# Patient Record
Sex: Female | Born: 1935 | Race: Black or African American | Hispanic: No | Smoking: Never smoker
Health system: Southern US, Community
[De-identification: ages and names within clinical notes are randomized; demographics above are authoritative.]

## PROBLEM LIST (undated history)

## (undated) DIAGNOSIS — F329 Major depressive disorder, single episode, unspecified: Secondary | ICD-10-CM

## (undated) DIAGNOSIS — E11319 Type 2 diabetes mellitus with unspecified diabetic retinopathy without macular edema: Secondary | ICD-10-CM

## (undated) DIAGNOSIS — F32A Depression, unspecified: Secondary | ICD-10-CM

## (undated) DIAGNOSIS — E7800 Pure hypercholesterolemia, unspecified: Secondary | ICD-10-CM

## (undated) DIAGNOSIS — H269 Unspecified cataract: Secondary | ICD-10-CM

## (undated) DIAGNOSIS — I10 Essential (primary) hypertension: Secondary | ICD-10-CM

## (undated) DIAGNOSIS — E119 Type 2 diabetes mellitus without complications: Secondary | ICD-10-CM

## (undated) DIAGNOSIS — M1990 Unspecified osteoarthritis, unspecified site: Secondary | ICD-10-CM

## (undated) HISTORY — DX: Depression, unspecified: F32.A

## (undated) HISTORY — DX: Unspecified osteoarthritis, unspecified site: M19.90

## (undated) HISTORY — DX: Major depressive disorder, single episode, unspecified: F32.9

## (undated) HISTORY — PX: EYE SURGERY: SHX253

## (undated) HISTORY — PX: TUBAL LIGATION: SHX77

## (undated) HISTORY — DX: Unspecified cataract: H26.9

---

## 2013-03-30 DIAGNOSIS — I10 Essential (primary) hypertension: Secondary | ICD-10-CM | POA: Insufficient documentation

## 2013-04-06 DIAGNOSIS — E113593 Type 2 diabetes mellitus with proliferative diabetic retinopathy without macular edema, bilateral: Secondary | ICD-10-CM | POA: Insufficient documentation

## 2013-04-06 DIAGNOSIS — E1139 Type 2 diabetes mellitus with other diabetic ophthalmic complication: Secondary | ICD-10-CM | POA: Insufficient documentation

## 2013-04-06 DIAGNOSIS — E1121 Type 2 diabetes mellitus with diabetic nephropathy: Secondary | ICD-10-CM | POA: Insufficient documentation

## 2013-07-20 DIAGNOSIS — E11311 Type 2 diabetes mellitus with unspecified diabetic retinopathy with macular edema: Secondary | ICD-10-CM | POA: Insufficient documentation

## 2013-09-16 DIAGNOSIS — N289 Disorder of kidney and ureter, unspecified: Secondary | ICD-10-CM | POA: Insufficient documentation

## 2014-03-30 ENCOUNTER — Encounter (HOSPITAL_COMMUNITY): Payer: Self-pay | Admitting: Emergency Medicine

## 2014-03-30 ENCOUNTER — Emergency Department (HOSPITAL_COMMUNITY): Payer: Medicare Other

## 2014-03-30 ENCOUNTER — Emergency Department (HOSPITAL_COMMUNITY)
Admission: EM | Admit: 2014-03-30 | Discharge: 2014-03-30 | Disposition: A | Discharge status: Home / Self Care | Payer: Medicare Other | Attending: Emergency Medicine | Admitting: Emergency Medicine

## 2014-03-30 DIAGNOSIS — Z79899 Other long term (current) drug therapy: Secondary | ICD-10-CM | POA: Insufficient documentation

## 2014-03-30 DIAGNOSIS — Z7982 Long term (current) use of aspirin: Secondary | ICD-10-CM | POA: Insufficient documentation

## 2014-03-30 DIAGNOSIS — R7989 Other specified abnormal findings of blood chemistry: Secondary | ICD-10-CM

## 2014-03-30 DIAGNOSIS — R748 Abnormal levels of other serum enzymes: Secondary | ICD-10-CM | POA: Insufficient documentation

## 2014-03-30 DIAGNOSIS — K047 Periapical abscess without sinus: Secondary | ICD-10-CM

## 2014-03-30 DIAGNOSIS — K044 Acute apical periodontitis of pulpal origin: Secondary | ICD-10-CM | POA: Insufficient documentation

## 2014-03-30 DIAGNOSIS — I10 Essential (primary) hypertension: Secondary | ICD-10-CM | POA: Insufficient documentation

## 2014-03-30 DIAGNOSIS — E7800 Pure hypercholesterolemia, unspecified: Secondary | ICD-10-CM | POA: Insufficient documentation

## 2014-03-30 DIAGNOSIS — L0201 Cutaneous abscess of face: Secondary | ICD-10-CM | POA: Insufficient documentation

## 2014-03-30 DIAGNOSIS — L03211 Cellulitis of face: Secondary | ICD-10-CM

## 2014-03-30 HISTORY — DX: Pure hypercholesterolemia, unspecified: E78.00

## 2014-03-30 HISTORY — DX: Essential (primary) hypertension: I10

## 2014-03-30 LAB — COMPREHENSIVE METABOLIC PANEL
ALK PHOS: 70 U/L (ref 39–117)
ALT: 12 U/L (ref 0–35)
AST: 17 U/L (ref 0–37)
Albumin: 3.3 g/dL — ABNORMAL LOW (ref 3.5–5.2)
BUN: 19 mg/dL (ref 6–23)
CO2: 23 meq/L (ref 19–32)
Calcium: 9.3 mg/dL (ref 8.4–10.5)
Chloride: 102 mEq/L (ref 96–112)
Creatinine, Ser: 1.35 mg/dL — ABNORMAL HIGH (ref 0.50–1.10)
GFR, EST AFRICAN AMERICAN: 42 mL/min — AB (ref >90)
GFR, EST NON AFRICAN AMERICAN: 37 mL/min — AB (ref >90)
GLUCOSE: 124 mg/dL — AB (ref 70–99)
POTASSIUM: 3.9 meq/L (ref 3.7–5.3)
Sodium: 139 mEq/L (ref 137–147)
Total Bilirubin: 0.8 mg/dL (ref 0.3–1.2)
Total Protein: 7.2 g/dL (ref 6.0–8.3)

## 2014-03-30 MED ORDER — OXYCODONE-ACETAMINOPHEN 5-325 MG PO TABS: 1.0000 | ORAL_TABLET | ORAL | Status: DC | PRN

## 2014-03-30 MED ORDER — ONDANSETRON 4 MG PO TBDP
4.0000 mg | ORAL_TABLET | Freq: Once | ORAL | Status: AC
  Administered 2014-03-30: 4 mg via ORAL
  Filled 2014-03-30: qty 1

## 2014-03-30 MED ORDER — AMOXICILLIN 500 MG PO CAPS: 500.0000 mg | ORAL_CAPSULE | Freq: Three times a day (TID) | ORAL | Status: DC

## 2014-03-30 MED ORDER — OXYCODONE-ACETAMINOPHEN 5-325 MG PO TABS
1.0000 | ORAL_TABLET | Freq: Once | ORAL | Status: AC
  Administered 2014-03-30: 1 via ORAL
  Filled 2014-03-30: qty 1

## 2014-03-30 MED ORDER — ONDANSETRON HCL 4 MG PO TABS: 4.0000 mg | ORAL_TABLET | Freq: Four times a day (QID) | ORAL | Status: DC

## 2014-03-30 MED ORDER — IOHEXOL 300 MG/ML  SOLN
80.0000 mL | Freq: Once | INTRAMUSCULAR | Status: AC | PRN
Start: 2014-03-30 — End: 2014-03-30
  Administered 2014-03-30: 80 mL via INTRAVENOUS

## 2014-03-30 NOTE — ED Provider Notes (Signed)
CSN: [id number]     Arrival date & time 03/30/14  1359 History   First MD Initiated Contact with Patient 03/30/14 1513     Chief Complaint  Patient presents with  . Dental Pain     (Consider location/radiation/quality/duration/timing/severity/associated sxs/prior Treatment) Patient is a 80 y.o. female presenting with tooth pain. The history is provided by the patient. No language interpreter was used.  Dental Pain Location:  Lower Lower teeth location:  30/RL 1st molar and 29/RL 2nd bicuspid Quality:  Constant Severity:  Severe Onset quality:  Gradual Duration:  24 hours Timing:  Constant Progression:  Worsening Context: abscess, crown fracture, dental caries and poor dentition   Context: cap still on, not dental fracture, not enamel fracture, filling still in place, not intrusion, not malocclusion, not recent dental surgery and not trauma   Relieved by:  Nothing Worsened by:  Pressure, jaw movement and touching Ineffective treatments:  NSAIDs and topical anesthetic gel Associated symptoms: facial pain, facial swelling and gum swelling   Associated symptoms: no difficulty swallowing, no fever, no headaches, no neck pain, no neck swelling, no oral bleeding, no oral lesions and no trismus   Risk factors: lack of dental care   Risk factors: no alcohol problem, no cancer, no diabetes and no smoking     Past Medical History  Diagnosis Date  . Hypertension   . High cholesterol    History reviewed. No pertinent past surgical history. History reviewed. No pertinent family history. History  Substance Use Topics  . Smoking status: Never Smoker   . Smokeless tobacco: Not on file  . Alcohol Use: No   OB History   Grav Para Term Preterm Abortions TAB SAB Ect Mult Living                 Review of Systems  Constitutional: Negative for fever.  HENT: Positive for facial swelling. Negative for mouth sores.   Musculoskeletal: Negative for neck pain.  Neurological: Negative for  headaches.      Allergies  Codeine  Home Medications   Prior to Admission medications   Medication Sig Start Date End Date Taking? Authorizing Provider  aspirin 81 MG tablet Take 81 mg by mouth daily.   Yes Historical Provider, MD  carvedilol (COREG) 12.5 MG tablet Take 12.5 mg by mouth 2 (two) times daily with a meal.   Yes Historical Provider, MD  lisinopril-hydrochlorothiazide (PRINZIDE,ZESTORETIC) 20-12.5 MG per tablet Take 1 tablet by mouth daily.   Yes Historical Provider, MD  Multiple Vitamin (MULTIVITAMIN WITH MINERALS) TABS tablet Take 1 tablet by mouth daily.   Yes Historical Provider, MD   BP 145/53  Pulse 62  Temp(Src) 97.9 F (36.6 C) (Oral)  Resp 16  SpO2 94% Physical Exam  Constitutional: She is oriented to person, place, and time. She appears well-developed and well-nourished. No distress.  HENT:  Head: Normocephalic and atraumatic.  Mouth/Throat:    Eyes: Conjunctivae are normal. No scleral icterus.  Neck: Normal range of motion.  Cardiovascular: Normal rate, regular rhythm and normal heart sounds.  Exam reveals no gallop and no friction rub.   No murmur heard. Pulmonary/Chest: Effort normal and breath sounds normal. No respiratory distress.  Abdominal: Soft. Bowel sounds are normal. She exhibits no distension and no mass. There is no tenderness. There is no guarding.  Neurological: She is alert and oriented to person, place, and time.  Skin: Skin is warm and dry. She is not diaphoretic.    ED Course  Procedures (including  critical care time) Labs Review Labs Reviewed - No data to display  Imaging Review No results found.   EKG Interpretation None      MDM   Final diagnoses:  None   3:44 PM BP 145/53  Pulse 62  Temp(Src) 97.9 F (36.6 C) (Oral)  Resp 16  SpO2 94% Patient seen in shared visit with Dr. Sabra Heck. Will CT face. Pain meds given'   6:23 PM CT is negative. She was only cellulitis. No gross abscess. The patient will be  discharged with pain medication and amoxicillin. She was to follow up with her dentist shortly. Patient wasn't elevated serum creatinine. She is instructed to follow up with her PCP within 72 hours for a check of his creatinine   Margarita Mail, PA-C 03/30/14 1825

## 2014-03-30 NOTE — ED Notes (Signed)
Pt reports cracked bottom right tooth. Pt started having right sided dental pain yesterday, swelling very noticeable this morning. Denies difficulty breathing. Pain 10/10. Reports she started on a cholesterol medicine 2 weeks ago.

## 2014-03-30 NOTE — Discharge Instructions (Signed)
You have been seen by your caregiver because of dental pain.  SEEK MEDICAL ATTENTION IF: The exam and treatment you received today has been provided on an emergency basis only. This is not a substitute for complete medical or dental care. If your problem worsens or new symptoms (problems) appear, and you are unable to arrange prompt follow-up care with your dentist, call or return to this location. CALL YOUR DENTIST OR RETURN IMMEDIATELY IF you develop a fever, rash, difficulty breathing or swallowing, neck or facial swelling, or other potentially serious concerns.   Your Creatinine (a marker of your kidney function) was elevated at 1.38 today. You will need to follow up with your doctor within  72 hours to have this rechecked.   You have been diagnosed with Dental pain. Please call the follow up dentist first thing in the morning on Monday for a follow up appointment. Keep your discharge paperwork from today's visit to bring to the dentist office. You may also use the resource guide listed below to help you find a dentist if you do not already have one to followup with. It is very important that you get evaluated by a dentist as soon as possible.  Use your pain medication as prescribed and do not operate heavy machinery while on pain medication. Note that your pain medication contains acetaminophen (Tylenol) & its is not reccommended that you use additional acetaminophen (Tylenol) while taking this medication. Take your full course of antibiotics. Read the instructions below.  Eat a soft or liquid diet and rinse your mouth out after meals with warm water. You should see a dentist or return here at once if you have increased swelling, increased pain or uncontrolled bleeding from the site of your injury.   SEEK MEDICAL CARE IF:   You have increased pain not controlled with medicines.   You have swelling around your tooth, in your face or neck.   You have bleeding which starts, continues, or gets  worse.   You have a fever >101  If you are unable to open your mouth Soft Diet  The soft diet may be recommended after you were put on a full liquid diet. A normal diet may follow. The soft diet can also be used after surgery if you are too ill to keep down a normal diet. The soft diet may also be needed if you have a hard time chewing foods.  DESCRIPTION  Tender foods are used. Foods do not need to be ground or pureed. Most raw fruits and vegetables and coarse breads and cereals should be avoided. Fried foods and highly seasoned foods may cause discomfort.  NUTRITIONAL ADEQUACY  A healthy diet is possible if foods from each of the basic food groups are eaten daily.  SOFT DIET FOOD LISTS  Milk/Dairy  Allowed: Milk and milk drinks, milk shakes, cream cheese, cottage cheese, mild cheeses.  Avoid: Sharp or highly seasoned cheese. Meat/Meat Substitutes  Allowed: Broiled, roasted, baked, or stewed tender lean beef, mutton, lamb, veal, chicken, [place], liver, ham, crisp bacon, white fish, tuna, salmon. Eggs, smooth peanut butter.  Avoid: All fried meats, fish, or fowl. Rich gravies and sauces. Lunch meats, sausages, hot dogs. Meats with gristle, chunky peanut butter. Breads/Grains  Allowed: Rice, noodles, spaghetti, macaroni. Dry or cooked refined cereals, such as farina, cream of wheat, oatmeal, grits, whole-wheat cereals. Plain or toasted white or wheat blend or whole-grain breads, soda crackers or saltines, flour tortillas.  Avoid: Wild rice, coarse cereals, such as bran.  Seed in or on breads and crackers. Bread or bread products with nuts or seeds. Fruits/Vegetables  Allowed: Fruit and vegetable juices, well-cooked or canned fruits and vegetables, any dried fruit. One citrus fruit daily, 1 vitamin A source daily. Well-ripened, easy to chew fruits, sweet potatoes. Baked, boiled, mashed, creamed, scalloped, or au gratin potatoes. Broths or creamed soups made with allowed vegetables, strained  tomatoes.  Avoid: All gas-forming vegetables (corn, radishes, Brussels sprouts, onions, broccoli, cabbage, parsnips, turnips, chili peppers, pinto beans, split peas, dried beans). Fruits containing seeds and skin. Potato chips and corn chips. All others that are not made with allowed vegetables. Highly seasoned soups. Desserts/Sweets  Allowed: Simple desserts, such as custard, junkets, gelatin desserts, plain ice cream and sherbets, simple cakes and cookies, allowed fruits, sugar, syrup, jelly, honey, plain hard candy, and molasses.  Avoid: Rich pastries, any dessert containing dates, nuts, raisins, or coconut. Fried pastries, such as doughnuts. Chocolate. Beverages  Allowed: Fruit and vegetable juices. Caffeine-free carbonated drinks, coffee, and tea.  Avoid: Caffeinated beverages: coffee, tea, soda or pop. Miscellaneous  Allowed: Butter, cream, margarine, mayonnaise, oil. Cream sauces, salt, and mild spices.  Avoid: Highly spiced salad dressings. Highly seasoned foods, hot sauce, mustard, horseradish, and pepper. SAMPLE MENU  Breakfast  Orange juice.  Oatmeal.  Soft cooked egg.  Toast and margarine.  2% milk.  Coffee. Lunch  Meatloaf.  Mashed potato.  Green beans.  Lemon pudding.  Bread and margarine.  Coffee. Dinner  Consomm or apricot nectar.  Chicken breast.  Rice, peas, and carrots.  Applesauce.  Bread and margarine.  2% milk. To cut the amount of fat in your diet, omit margarine and use 1% or skim milk.  NUTRIENT ANALYSIS  Calories........................1953 Kcal.  Protein.........................102 gm.  Carbohydrate...............247 gm.  Fat................................65 gm.  Cholesterol...................449 mg.  Dietary fiber.................19 gm.  Vitamin A.....................2944 RE.  Vitamin C.....................79 mg.  Niacin..........................25 mg.  Riboflavin....................2.0 mg.  Thiamin.......................1.5 mg.    Folate..........................249 mcg.  Calcium.......................1030 mg.  Phosphorus.................1782 mg.  Zinc..............................12 mg.  Iron..............................13 mg.  Sodium.........................299 mg.  Potassium....................3046 mg. Document Released: 02/19/2008 Document Revised: 02/04/2012 Document Reviewed: 02/19/2008  [facility] Patient Information 2014 [place], [place].   \ Abscess An abscess is an infected area that contains a collection of pus and debris.It can occur in almost any part of the body. An abscess is also known as a furuncle or boil. CAUSES  An abscess occurs when tissue gets infected. This can occur from blockage of oil or sweat glands, infection of hair follicles, or a minor injury to the skin. As the body tries to fight the infection, pus collects in the area and creates pressure under the skin. This pressure causes pain. People with weakened immune systems have difficulty fighting infections and get certain abscesses more often.  SYMPTOMS Usually an abscess develops on the skin and becomes a painful mass that is red, warm, and tender. If the abscess forms under the skin, you may feel a moveable soft area under the skin. Some abscesses break open (rupture) on their own, but most will continue to get worse without care. The infection can spread deeper into the body and eventually into the bloodstream, causing you to feel ill.  DIAGNOSIS  Your caregiver will take your medical history and perform a physical exam. A sample of fluid may also be taken from the abscess to determine what is causing your infection. TREATMENT  Your caregiver may prescribe antibiotic medicines to fight the infection. However, taking antibiotics alone usually does not cure an  abscess. Your caregiver may need to make a small cut (incision) in the abscess to drain the pus. In some cases, gauze is packed into the abscess to reduce pain and to continue draining  the area. HOME CARE INSTRUCTIONS   Only take over-the-counter or prescription medicines for pain, discomfort, or fever as directed by your caregiver.  If you were prescribed antibiotics, take them as directed. Finish them even if you start to feel better.  If gauze is used, follow your caregiver's directions for changing the gauze.  To avoid spreading the infection:  Keep your draining abscess covered with a bandage.  Wash your hands well.  Do not share personal care items, towels, or whirlpools with others.  Avoid skin contact with others.  Keep your skin and clothes clean around the abscess.  Keep all follow-up appointments as directed by your caregiver. SEEK MEDICAL CARE IF:   You have increased pain, swelling, redness, fluid drainage, or bleeding.  You have muscle aches, chills, or a general ill feeling.  You have a fever. MAKE SURE YOU:   Understand these instructions.  Will watch your condition.  Will get help right away if you are not doing well or get worse. Document Released: 08/22/2005 Document Revised: 05/13/2012 Document Reviewed: 01/25/2012 [facility] Patient Information 2014 [place].  RESOURCE GUIDE   Dental Problems  Dr. Alexis Goodell $200 dollar visit [address] [place], [place] [address]  [phone]    Patients with Medicaid: [place] [phone] [address] Phone:  [phone]                                                  Phone:  [phone]  If unable to pay or uninsured, contact:  [facility] or [facility]. to become qualified for the adult dental clinic.  Chronic Pain Problems Contact [facility]  [phone] Patients need to be referred by their primary care doctor.  Insufficient Money for Medicine Contact [employer]:  call "211" or [place] [phone].  No  Primary Care Doctor Call [facility]  [phone] Other agencies that provide inexpensive medical care    [place]  [phone]    [facility]  [place]  [phone]    [facility]  [phone]    [facility]  [place]  [phone] [place]   [phone] (emergency services [phone])  Substance Abuse [facility]  2147797121 [facility] [phone] [facility] [place] [phone] Chinita Pester [phone] Residential & Outpatient Substance Abuse Program  [phone]  Abuse/Neglect [place] [phone] [place] [phone] (After Hours)  Emergency [place] [phone]  [place] [facility] [phone] Bartolo Darter  [place] Services [phone]  MRSA Hotline #:   (757)214-7594    [facility] Dept. [address]      [place] Phone:  [phone]                                   Phone:  [phone]                 Phone:  [place] Phone:  [place] [phone] (After Hours)

## 2014-03-30 NOTE — ED Provider Notes (Signed)
80 y/o female with a history of very poor dentition.  Presents with pain to the R lower jaw - this has become acutely worse over the last 2 days - worse with chewing, no pain with swallowing, no fevers.  On exam has a large swollen R lower jaw with significant pain and tenderness.  There is a patent airway and no swelling under the tongue or pain with tongue protrusion.  CT necessary to eval extent of swelling.    No abscess seen, pt with no compromised airway, cellulitis to be treated with abx, pt informed of tx plan  Medical screening examination/treatment/procedure(s) were conducted as a shared visit with non-physician practitioner(s) and myself.  I personally evaluated the patient during the encounter.    Johnna Acosta, MD 03/31/14 2112

## 2014-03-30 NOTE — ED Notes (Signed)
[phone]  Linton Rump- daughter, she will be back later.

## 2014-03-31 NOTE — ED Provider Notes (Signed)
Medical screening examination/treatment/procedure(s) were conducted as a shared visit with non-physician practitioner(s) and myself.  I personally evaluated the patient during the encounter  Please see my separate respective documentation pertaining to this patient encounter   Johnna Acosta, MD 03/31/14 2113

## 2014-06-24 ENCOUNTER — Encounter (HOSPITAL_COMMUNITY): Payer: Self-pay | Admitting: Emergency Medicine

## 2014-06-24 ENCOUNTER — Emergency Department (HOSPITAL_COMMUNITY)
Admission: EM | Admit: 2014-06-24 | Discharge: 2014-06-25 | Disposition: A | Discharge status: Home / Self Care | Payer: Medicare HMO | Attending: Emergency Medicine | Admitting: Emergency Medicine

## 2014-06-24 DIAGNOSIS — Z7982 Long term (current) use of aspirin: Secondary | ICD-10-CM | POA: Insufficient documentation

## 2014-06-24 DIAGNOSIS — I10 Essential (primary) hypertension: Secondary | ICD-10-CM | POA: Insufficient documentation

## 2014-06-24 DIAGNOSIS — M25439 Effusion, unspecified wrist: Secondary | ICD-10-CM | POA: Diagnosis not present

## 2014-06-24 DIAGNOSIS — Z79899 Other long term (current) drug therapy: Secondary | ICD-10-CM | POA: Diagnosis not present

## 2014-06-24 DIAGNOSIS — M25531 Pain in right wrist: Secondary | ICD-10-CM

## 2014-06-24 DIAGNOSIS — M25539 Pain in unspecified wrist: Secondary | ICD-10-CM | POA: Diagnosis not present

## 2014-06-24 DIAGNOSIS — E7800 Pure hypercholesterolemia, unspecified: Secondary | ICD-10-CM | POA: Insufficient documentation

## 2014-06-24 NOTE — ED Notes (Signed)
Pt was dropped off at Urgent Care by her daughter and they sent her here, she complains of wrist pain and had a negative xray at Urgent Care, she had a low grade fever at Urgent Care. Apparently her daughter dropped her off here also. Pt came in talking about the rain and it's not raining outside. Pt might have a history of dementia

## 2014-06-25 ENCOUNTER — Emergency Department (HOSPITAL_COMMUNITY): Payer: Medicare HMO

## 2014-06-25 LAB — CBC WITH DIFFERENTIAL/PLATELET
BASOS PCT: 0 % (ref 0–1)
Basophils Absolute: 0 10*3/uL (ref 0.0–0.1)
EOS ABS: 0.2 10*3/uL (ref 0.0–0.7)
Eosinophils Relative: 2 % (ref 0–5)
HCT: 31.8 % — ABNORMAL LOW (ref 36.0–46.0)
Hemoglobin: 11.2 g/dL — ABNORMAL LOW (ref 12.0–15.0)
Lymphocytes Relative: 22 % (ref 12–46)
Lymphs Abs: 2.4 10*3/uL (ref 0.7–4.0)
MCH: 32.2 pg (ref 26.0–34.0)
MCHC: 35.2 g/dL (ref 30.0–36.0)
MCV: 91.4 fL (ref 78.0–100.0)
MONOS PCT: 9 % (ref 3–12)
Monocytes Absolute: 1 10*3/uL (ref 0.1–1.0)
Neutro Abs: 7.1 10*3/uL (ref 1.7–7.7)
Neutrophils Relative %: 67 % (ref 43–77)
PLATELETS: 347 10*3/uL (ref 150–400)
RBC: 3.48 MIL/uL — ABNORMAL LOW (ref 3.87–5.11)
RDW: 12.3 % (ref 11.5–15.5)
WBC: 10.7 10*3/uL — ABNORMAL HIGH (ref 4.0–10.5)

## 2014-06-25 LAB — URINE MICROSCOPIC-ADD ON

## 2014-06-25 LAB — URINALYSIS, ROUTINE W REFLEX MICROSCOPIC
BILIRUBIN URINE: NEGATIVE
Glucose, UA: NEGATIVE mg/dL
Hgb urine dipstick: NEGATIVE
KETONES UR: NEGATIVE mg/dL
Nitrite: NEGATIVE
Protein, ur: NEGATIVE mg/dL
Specific Gravity, Urine: 1.013 (ref 1.005–1.030)
UROBILINOGEN UA: 0.2 mg/dL (ref 0.0–1.0)
pH: 5 (ref 5.0–8.0)

## 2014-06-25 LAB — BASIC METABOLIC PANEL
ANION GAP: 16 — AB (ref 5–15)
BUN: 13 mg/dL (ref 6–23)
CALCIUM: 10.1 mg/dL (ref 8.4–10.5)
CO2: 24 meq/L (ref 19–32)
CREATININE: 1.37 mg/dL — AB (ref 0.50–1.10)
Chloride: 93 mEq/L — ABNORMAL LOW (ref 96–112)
GFR calc Af Amer: 42 mL/min — ABNORMAL LOW (ref >90)
GFR calc non Af Amer: 36 mL/min — ABNORMAL LOW (ref >90)
Glucose, Bld: 175 mg/dL — ABNORMAL HIGH (ref 70–99)
Potassium: 3.9 mEq/L (ref 3.7–5.3)
Sodium: 133 mEq/L — ABNORMAL LOW (ref 137–147)

## 2014-06-25 LAB — URIC ACID: Uric Acid, Serum: 8.9 mg/dL — ABNORMAL HIGH (ref 2.4–7.0)

## 2014-06-25 LAB — SEDIMENTATION RATE: Sed Rate: 126 mm/hr — ABNORMAL HIGH (ref 0–22)

## 2014-06-25 MED ORDER — IBUPROFEN 600 MG PO TABS: 600.0000 mg | ORAL_TABLET | Freq: Three times a day (TID) | ORAL | Status: DC | PRN

## 2014-06-25 NOTE — ED Provider Notes (Signed)
CSN: [id number]     Arrival date & time 06/24/14  2206 History   First MD Initiated Contact with Patient 06/25/14 0024     Chief Complaint  Patient presents with  . Wrist Pain     (Consider location/radiation/quality/duration/timing/severity/associated sxs/prior Treatment) HPI This is a 80 year old female with pain in her right forearm for the past 2 and half days. She denies injury. There is associated swelling it was worse earlier but has improved. She was seen at [facility] yesterday evening. Per their report the patient was unable to move her right wrist due to severe pain. X-rays were performed of the right wrist and the right elbow which were read as negative. She was noted to have a temperature of 100.3. She was sent here out of concern for a septic joint. The patient denies chills, nausea or vomiting. She has not been coughing or shortness of breath. She denies dysuria. She states she was given ibuprofen at the urgent care and has experienced significant improvement in her pain the  Past Medical History  Diagnosis Date  . Hypertension   . High cholesterol    History reviewed. No pertinent past surgical history. History reviewed. No pertinent family history. History  Substance Use Topics  . Smoking status: Never Smoker   . Smokeless tobacco: Not on file  . Alcohol Use: No   OB History   Grav Para Term Preterm Abortions TAB SAB Ect Mult Living                 Review of Systems  All other systems reviewed and are negative.   Allergies  Codeine  Home Medications   Prior to Admission medications   Medication Sig Start Date End Date Taking? Authorizing Provider  aspirin 81 MG tablet Take 81 mg by mouth every other day.    Yes Historical Provider, MD  carvedilol (COREG) 12.5 MG tablet Take 12.5 mg by mouth 2 (two) times daily with a meal.   Yes Historical Provider, MD  ibuprofen (ADVIL,MOTRIN) 200 MG tablet Take 400 mg by mouth every 6 (six) hours as needed for  moderate pain.   Yes Historical Provider, MD  lisinopril-hydrochlorothiazide (PRINZIDE,ZESTORETIC) 20-12.5 MG per tablet Take 1 tablet by mouth daily.   Yes Historical Provider, MD  Multiple Vitamin (MULTIVITAMIN WITH MINERALS) TABS tablet Take 1 tablet by mouth daily.   Yes Historical Provider, MD  oxyCODONE-acetaminophen (PERCOCET) 5-325 MG per tablet Take 1-2 tablets by mouth every 4 (four) hours as needed. 03/30/14  Yes Margarita Mail, PA-C  pravastatin (PRAVACHOL) 10 MG tablet Take 10 mg by mouth daily.   Yes Historical Provider, MD  tiZANidine (ZANAFLEX) 4 MG tablet Take 4 mg by mouth 2 (two) times daily as needed for muscle spasms.   Yes Historical Provider, MD   BP 149/62  Pulse 64  Temp(Src) 99.1 F (37.3 C) (Oral)  Resp 16  SpO2 100%  Physical Exam General: Well-developed, well-nourished female in no acute distress; appearance consistent with age of record HENT: normocephalic; atraumatic Eyes: pupils equal, round and reactive to light; extraocular muscles intact; lens implant Neck: supple Heart: regular rate and rhythm Lungs: clear to auscultation bilaterally Abdomen: soft; nondistended; nontender; no masses or hepatosplenomegaly; bowel sounds present Extremities: No deformity; swelling and warmth of right forearm without appreciable erythema, right snuff box tenderness but otherwise nontender right wrist but with pain in the first 3 fingers on flexion and extension of the right wrist, tendon function intact, right hand distally neurovascular intact  Neurologic: Awake, alert and oriented x 4; motor function intact in all extremities and symmetric; no facial droop Skin: Warm and dry Psychiatric: Normal mood and affect    ED Course  Procedures (including critical care time)  MDM   Nursing notes and vitals signs, including pulse oximetry, reviewed.  Summary of this visit's results, reviewed by myself:  Labs:  Results for orders placed during the hospital encounter of  06/24/14 (from the past 24 hour(s))  CBC WITH DIFFERENTIAL     Status: Abnormal   Collection Time    06/25/14  2:03 AM      Result Value Ref Range   WBC 10.7 (*) 4.0 - 10.5 K/uL   RBC 3.48 (*) 3.87 - 5.11 MIL/uL   Hemoglobin 11.2 (*) 12.0 - 15.0 g/dL   HCT 31.8 (*) 36.0 - 46.0 %   MCV 91.4  78.0 - 100.0 fL   MCH 32.2  26.0 - 34.0 pg   MCHC 35.2  30.0 - 36.0 g/dL   RDW 12.3  11.5 - 15.5 %   Platelets 347  150 - 400 K/uL   Neutrophils Relative % 67  43 - 77 %   Neutro Abs 7.1  1.7 - 7.7 K/uL   Lymphocytes Relative 22  12 - 46 %   Lymphs Abs 2.4  0.7 - 4.0 K/uL   Monocytes Relative 9  3 - 12 %   Monocytes Absolute 1.0  0.1 - 1.0 K/uL   Eosinophils Relative 2  0 - 5 %   Eosinophils Absolute 0.2  0.0 - 0.7 K/uL   Basophils Relative 0  0 - 1 %   Basophils Absolute 0.0  0.0 - 0.1 K/uL  SEDIMENTATION RATE     Status: Abnormal   Collection Time    06/25/14  2:03 AM      Result Value Ref Range   Sed Rate 126 (*) 0 - 22 mm/hr  URIC ACID     Status: Abnormal   Collection Time    06/25/14  2:03 AM      Result Value Ref Range   Uric Acid, Serum 8.9 (*) 2.4 - 7.0 mg/dL  BASIC METABOLIC PANEL     Status: Abnormal   Collection Time    06/25/14  2:03 AM      Result Value Ref Range   Sodium 133 (*) 137 - 147 mEq/L   Potassium 3.9  3.7 - 5.3 mEq/L   Chloride 93 (*) 96 - 112 mEq/L   CO2 24  19 - 32 mEq/L   Glucose, Bld 175 (*) 70 - 99 mg/dL   BUN 13  6 - 23 mg/dL   Creatinine, Ser 1.37 (*) 0.50 - 1.10 mg/dL   Calcium 10.1  8.4 - 10.5 mg/dL   GFR calc non Af Amer 36 (*) >90 mL/min   GFR calc Af Amer 42 (*) >90 mL/min   Anion gap 16 (*) 5 - 15  URINALYSIS, ROUTINE W REFLEX MICROSCOPIC     Status: Abnormal   Collection Time    06/25/14  3:06 AM      Result Value Ref Range   Color, Urine YELLOW  YELLOW   APPearance CLOUDY (*) CLEAR   Specific Gravity, Urine 1.013  1.005 - 1.030   pH 5.0  5.0 - 8.0   Glucose, UA NEGATIVE  NEGATIVE mg/dL   Hgb urine dipstick NEGATIVE  NEGATIVE    Bilirubin Urine NEGATIVE  NEGATIVE   Ketones, ur NEGATIVE  NEGATIVE mg/dL  Protein, ur NEGATIVE  NEGATIVE mg/dL   Urobilinogen, UA 0.2  0.0 - 1.0 mg/dL   Nitrite NEGATIVE  NEGATIVE   Leukocytes, UA SMALL (*) NEGATIVE  URINE MICROSCOPIC-ADD ON     Status: Abnormal   Collection Time    06/25/14  3:06 AM      Result Value Ref Range   Squamous Epithelial / LPF FEW (*) RARE   WBC, UA 0-2  <3 WBC/hpf   Urine-Other MUCOUS PRESENT      Imaging Studies: Dg Wrist Navic Only Right  06/25/2014   CLINICAL DATA:  Pain in the navicular area with limited movement of the wrist.  EXAM: DG WRIST NAVIC ONLY RIGHT  COMPARISON:  None.  FINDINGS: Limited evaluation of the wrist with single view obtained. There appear to be degenerative changes with calcification in the triangular fibrocartilage to and radial carpal joint consistent with chondrocalcinosis. As visualized, the carpal bones appear intact.  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 Scaphoid bone appears intact on single view.   Electronically Signed   By: Lucienne Capers M.D.   On: 06/25/2014 02:21   4:41 AM Patient is now able to move right wrist through full range of motion without significant pain. Her wrist is now nontender. This is not consistent with a septic joint. It is more likely this represents gout as her uric acid is elevated. The elevated ESR is nonspecific. We will have her followup with hand surgery or return to the ED should symptoms worsen.     Wynetta Fines, MD 06/25/14 [phone]

## 2014-06-25 NOTE — Discharge Instructions (Signed)

## 2014-06-25 NOTE — ED Notes (Signed)
Patient ambulated to restroom to collect urine sample.

## 2014-06-25 NOTE — ED Notes (Signed)
MD at bedside. 

## 2014-06-25 NOTE — ED Notes (Signed)
Pt said that she can't urinate when she "gets excited" and stated she will try in about five minutes.

## 2015-08-02 DIAGNOSIS — H26492 Other secondary cataract, left eye: Secondary | ICD-10-CM | POA: Insufficient documentation

## 2015-08-02 DIAGNOSIS — Z961 Presence of intraocular lens: Secondary | ICD-10-CM | POA: Insufficient documentation

## 2016-02-07 DIAGNOSIS — H4311 Vitreous hemorrhage, right eye: Secondary | ICD-10-CM | POA: Insufficient documentation

## 2016-02-14 ENCOUNTER — Ambulatory Visit (INDEPENDENT_AMBULATORY_CARE_PROVIDER_SITE_OTHER): Payer: Medicare HMO | Admitting: Sports Medicine

## 2016-02-14 ENCOUNTER — Encounter: Payer: Self-pay | Admitting: Sports Medicine

## 2016-02-14 VITALS — BP 125/62 | HR 54 | Resp 12

## 2016-02-14 DIAGNOSIS — M79671 Pain in right foot: Secondary | ICD-10-CM | POA: Diagnosis not present

## 2016-02-14 DIAGNOSIS — B351 Tinea unguium: Secondary | ICD-10-CM | POA: Diagnosis not present

## 2016-02-14 DIAGNOSIS — M79672 Pain in left foot: Secondary | ICD-10-CM

## 2016-02-14 DIAGNOSIS — M21619 Bunion of unspecified foot: Secondary | ICD-10-CM | POA: Diagnosis not present

## 2016-02-14 DIAGNOSIS — E119 Type 2 diabetes mellitus without complications: Secondary | ICD-10-CM

## 2016-02-14 DIAGNOSIS — M2040 Other hammer toe(s) (acquired), unspecified foot: Secondary | ICD-10-CM

## 2016-02-14 NOTE — Progress Notes (Deleted)
   Subjective:    Patient ID: Leah Stafford, female    DOB: [birth year], 82 y.o.   MRN: [id number]  HPI pt requesting for toenails are long and have discoloration/thick debridement and feet check.   Review of Systems  Skin: Positive for color change.  All other systems reviewed and are negative.      Objective:   Physical Exam        Assessment & Plan:

## 2016-02-14 NOTE — Patient Instructions (Signed)
Diabetes and Foot Care Diabetes may cause you to have problems because of poor blood supply (circulation) to your feet and legs. This may cause the skin on your feet to become thinner, break easier, and heal more slowly. Your skin may become dry, and the skin may peel and crack. You may also have nerve damage in your legs and feet causing decreased feeling in them. You may not notice minor injuries to your feet that could lead to infections or more serious problems. Taking care of your feet is one of the most important things you can do for yourself.  HOME CARE INSTRUCTIONS  Wear shoes at all times, even in the house. Do not go barefoot. Bare feet are easily injured.  Check your feet daily for blisters, cuts, and redness. If you cannot see the bottom of your feet, use a mirror or ask someone for help.  Wash your feet with warm water (do not use hot water) and mild soap. Then pat your feet and the areas between your toes until they are completely dry. Do not soak your feet as this can dry your skin.  Apply a moisturizing lotion or petroleum jelly (that does not contain alcohol and is unscented) to the skin on your feet and to dry, brittle toenails. Do not apply lotion between your toes.  Trim your toenails straight across. Do not dig under them or around the cuticle. File the edges of your nails with an emery board or nail file.  Do not cut corns or calluses or try to remove them with medicine.  Wear clean socks or stockings every day. Make sure they are not too tight. Do not wear knee-high stockings since they may decrease blood flow to your legs.  Wear shoes that fit properly and have enough cushioning. To break in new shoes, wear them for just a few hours a day. This prevents you from injuring your feet. Always look in your shoes before you put them on to be sure there are no objects inside.  Do not cross your legs. This may decrease the blood flow to your feet.  If you find a minor scrape,  cut, or break in the skin on your feet, keep it and the skin around it clean and dry. These areas may be cleansed with mild soap and water. Do not cleanse the area with peroxide, alcohol, or iodine.  When you remove an adhesive bandage, be sure not to damage the skin around it.  If you have a wound, look at it several times a day to make sure it is healing.  Do not use heating pads or hot water bottles. They may burn your skin. If you have lost feeling in your feet or legs, you may not know it is happening until it is too late.  Make sure your health care provider performs a complete foot exam at least annually or more often if you have foot problems. Report any cuts, sores, or bruises to your health care provider immediately. SEEK MEDICAL CARE IF:   You have an injury that is not healing.  You have cuts or breaks in the skin.  You have an ingrown nail.  You notice redness on your legs or feet.  You feel burning or tingling in your legs or feet.  You have pain or cramps in your legs and feet.  Your legs or feet are numb.  Your feet always feel cold. SEEK IMMEDIATE MEDICAL CARE IF:   There is increasing redness,   swelling, or pain in or around a wound.  There is a red line that goes up your leg.  Pus is coming from a wound.  You develop a fever or as directed by your health care provider.  You notice a bad smell coming from an ulcer or wound.   This information is not intended to replace advice given to you by your health care provider. Make sure you discuss any questions you have with your health care provider.   Document Released: 11/09/2000 Document Revised: 07/15/2013 Document Reviewed: 04/21/2013 Elsevier Interactive Patient Education 2016 Elsevier Inc[employer]  

## 2016-02-14 NOTE — Progress Notes (Signed)
Patient ID: Leah Stafford, female   DOB: [birth year], 82 y.o.   MRN: [id number] Subjective: Leah Stafford is a 82 y.o. female patient with history of diabetes who presents to office today complaining of long, painful nails  while ambulating in shoes; unable to trim. Patient states that the glucose reading this morning was not recorded; not on meds. Patient denies any new changes in medication or new problems. Patient denies any new cramping, numbness, burning or tingling in the legs.  Review of Systems  Skin: Positive for color change.  All other systems reviewed and are negative.   There are no active problems to display for this patient.  Current Outpatient Prescriptions on File Prior to Visit  Medication Sig Dispense Refill  . aspirin 81 MG tablet Take 81 mg by mouth every other day.     . carvedilol (COREG) 12.5 MG tablet Take 12.5 mg by mouth 2 (two) times daily with a meal.    . ibuprofen (ADVIL,MOTRIN) 200 MG tablet Take 400 mg by mouth every 6 (six) hours as needed for moderate pain.    Marland Kitchen ibuprofen (ADVIL,MOTRIN) 600 MG tablet Take 1 tablet (600 mg total) by mouth every 8 (eight) hours as needed (for wrist pain). 12 tablet 0  . lisinopril-hydrochlorothiazide (PRINZIDE,ZESTORETIC) 20-12.5 MG per tablet Take 1 tablet by mouth daily.    . Multiple Vitamin (MULTIVITAMIN WITH MINERALS) TABS tablet Take 1 tablet by mouth daily.    Marland Kitchen oxyCODONE-acetaminophen (PERCOCET) 5-325 MG per tablet Take 1-2 tablets by mouth every 4 (four) hours as needed. 20 tablet 0  . pravastatin (PRAVACHOL) 10 MG tablet Take 10 mg by mouth daily.    Marland Kitchen tiZANidine (ZANAFLEX) 4 MG tablet Take 4 mg by mouth 2 (two) times daily as needed for muscle spasms.     No current facility-administered medications on file prior to visit.   Allergies  Allergen Reactions  . Codeine Nausea And Vomiting    No results found for this or any previous visit (from the past 2160 hour(s)).  Objective: General: Patient is awake,  alert, and oriented x 3 and in no acute distress.  Integument: Skin is warm, dry and supple bilateral. Nails are tender, long, thickened and  dystrophic with subungual debris, consistent with onychomycosis, 1-5 bilateral. No signs of infection. No open lesions or preulcerative lesions present bilateral. Remaining integument unremarkable.  Vasculature:  Dorsalis Pedis pulse 2/4 bilateral. Posterior Tibial pulse  1/4 bilateral.  Capillary fill time <3 sec 1-5 bilateral. Scant hair growth to the level of the digits. Temperature gradient within normal limits. No varicosities present bilateral. No edema present bilateral.   Neurology: The patient has intact sensation measured with a 5.07/10g Semmes Weinstein Monofilament at all pedal sites bilateral . Vibratory sensation intact bilateral with tuning fork. No Babinski sign present bilateral.   Musculoskeletal: Asymptomatic bunion and hammertoe pedal deformities noted bilateral. Muscular strength 5/5 in all lower extremity muscular groups bilateral without pain on range of motion . No tenderness with calf compression bilateral.  Assessment and Plan: Problem List Items Addressed This Visit    None    Visit Diagnoses    Dermatophytosis of nail    -  Primary    Foot pain, bilateral        Diabetes mellitus without complication ([place])        Bunion        Hammer toe, unspecified laterality           -Examined patient. -Discussed and educated patient on  diabetic foot care, especially with  regards to the vascular, neurological and musculoskeletal systems.  -Stressed the importance of good glycemic control and the detriment of not  controlling glucose levels in relation to the foot. -Mechanically debrided all nails 1-5 bilateral using sterile nail nipper and filed with dremel without incident  -Recommend good supportive shoes for foot type daily -Answered all patient questions -Patient to return  in 3 months for at risk foot care -Patient  advised to call the office if any problems or questions arise in the meantime.  Leah Stafford, DPM

## 2016-05-15 ENCOUNTER — Ambulatory Visit: Payer: Medicare HMO | Admitting: Sports Medicine

## 2016-09-27 ENCOUNTER — Encounter (INDEPENDENT_AMBULATORY_CARE_PROVIDER_SITE_OTHER): Payer: Medicare HMO | Admitting: Ophthalmology

## 2016-10-11 ENCOUNTER — Encounter (INDEPENDENT_AMBULATORY_CARE_PROVIDER_SITE_OTHER): Payer: Medicare HMO | Admitting: Ophthalmology

## 2016-10-11 DIAGNOSIS — H43813 Vitreous degeneration, bilateral: Secondary | ICD-10-CM | POA: Diagnosis not present

## 2016-10-11 DIAGNOSIS — H35033 Hypertensive retinopathy, bilateral: Secondary | ICD-10-CM | POA: Diagnosis not present

## 2016-10-11 DIAGNOSIS — E11311 Type 2 diabetes mellitus with unspecified diabetic retinopathy with macular edema: Secondary | ICD-10-CM | POA: Diagnosis not present

## 2016-10-11 DIAGNOSIS — E113511 Type 2 diabetes mellitus with proliferative diabetic retinopathy with macular edema, right eye: Secondary | ICD-10-CM | POA: Diagnosis not present

## 2016-10-11 DIAGNOSIS — H4311 Vitreous hemorrhage, right eye: Secondary | ICD-10-CM | POA: Diagnosis not present

## 2016-10-11 DIAGNOSIS — E113592 Type 2 diabetes mellitus with proliferative diabetic retinopathy without macular edema, left eye: Secondary | ICD-10-CM | POA: Diagnosis not present

## 2016-10-11 DIAGNOSIS — I10 Essential (primary) hypertension: Secondary | ICD-10-CM | POA: Diagnosis not present

## 2016-11-12 ENCOUNTER — Other Ambulatory Visit (INDEPENDENT_AMBULATORY_CARE_PROVIDER_SITE_OTHER): Payer: Medicare HMO | Admitting: Ophthalmology

## 2016-12-06 ENCOUNTER — Other Ambulatory Visit (INDEPENDENT_AMBULATORY_CARE_PROVIDER_SITE_OTHER): Payer: Medicare HMO | Admitting: Ophthalmology

## 2016-12-06 DIAGNOSIS — E113512 Type 2 diabetes mellitus with proliferative diabetic retinopathy with macular edema, left eye: Secondary | ICD-10-CM | POA: Diagnosis not present

## 2016-12-06 DIAGNOSIS — E11311 Type 2 diabetes mellitus with unspecified diabetic retinopathy with macular edema: Secondary | ICD-10-CM

## 2017-01-04 ENCOUNTER — Inpatient Hospital Stay (HOSPITAL_COMMUNITY)
Admission: AD | Admit: 2017-01-04 | Discharge: 2017-01-04 | Disposition: A | Discharge status: Home / Self Care | Payer: Medicare HMO | Source: Ambulatory Visit | Attending: Obstetrics & Gynecology | Admitting: Obstetrics & Gynecology

## 2017-01-04 ENCOUNTER — Encounter (HOSPITAL_COMMUNITY): Payer: Self-pay | Admitting: *Deleted

## 2017-01-04 DIAGNOSIS — R339 Retention of urine, unspecified: Secondary | ICD-10-CM

## 2017-01-04 DIAGNOSIS — Z885 Allergy status to narcotic agent status: Secondary | ICD-10-CM | POA: Diagnosis not present

## 2017-01-04 DIAGNOSIS — Z7982 Long term (current) use of aspirin: Secondary | ICD-10-CM | POA: Diagnosis not present

## 2017-01-04 DIAGNOSIS — E119 Type 2 diabetes mellitus without complications: Secondary | ICD-10-CM

## 2017-01-04 DIAGNOSIS — I10 Essential (primary) hypertension: Secondary | ICD-10-CM | POA: Insufficient documentation

## 2017-01-04 DIAGNOSIS — E11319 Type 2 diabetes mellitus with unspecified diabetic retinopathy without macular edema: Secondary | ICD-10-CM | POA: Diagnosis not present

## 2017-01-04 DIAGNOSIS — Z79899 Other long term (current) drug therapy: Secondary | ICD-10-CM | POA: Diagnosis not present

## 2017-01-04 DIAGNOSIS — N813 Complete uterovaginal prolapse: Secondary | ICD-10-CM

## 2017-01-04 DIAGNOSIS — E785 Hyperlipidemia, unspecified: Secondary | ICD-10-CM | POA: Insufficient documentation

## 2017-01-04 DIAGNOSIS — E7800 Pure hypercholesterolemia, unspecified: Secondary | ICD-10-CM | POA: Diagnosis not present

## 2017-01-04 DIAGNOSIS — N814 Uterovaginal prolapse, unspecified: Secondary | ICD-10-CM | POA: Diagnosis present

## 2017-01-04 HISTORY — DX: Type 2 diabetes mellitus with unspecified diabetic retinopathy without macular edema: E11.319

## 2017-01-04 HISTORY — DX: Type 2 diabetes mellitus without complications: E11.9

## 2017-01-04 LAB — BASIC METABOLIC PANEL
Anion gap: 7 (ref 5–15)
BUN: 16 mg/dL (ref 6–20)
CO2: 27 mmol/L (ref 22–32)
CREATININE: 1.21 mg/dL — AB (ref 0.44–1.00)
Calcium: 9.5 mg/dL (ref 8.9–10.3)
Chloride: 104 mmol/L (ref 101–111)
GFR calc non Af Amer: 41 mL/min — ABNORMAL LOW (ref >60)
GFR, EST AFRICAN AMERICAN: 48 mL/min — AB (ref >60)
Glucose, Bld: 100 mg/dL — ABNORMAL HIGH (ref 65–99)
Potassium: 4.1 mmol/L (ref 3.5–5.1)
Sodium: 138 mmol/L (ref 135–145)

## 2017-01-04 LAB — URINALYSIS, ROUTINE W REFLEX MICROSCOPIC
BILIRUBIN URINE: NEGATIVE
Bilirubin Urine: NEGATIVE
Glucose, UA: NEGATIVE mg/dL
Glucose, UA: NEGATIVE mg/dL
Hgb urine dipstick: NEGATIVE
Hgb urine dipstick: NEGATIVE
Ketones, ur: NEGATIVE mg/dL
Ketones, ur: NEGATIVE mg/dL
LEUKOCYTES UA: NEGATIVE
Leukocytes, UA: NEGATIVE
NITRITE: NEGATIVE
NITRITE: NEGATIVE
PH: 7 (ref 5.0–8.0)
Protein, ur: NEGATIVE mg/dL
Protein, ur: NEGATIVE mg/dL
SPECIFIC GRAVITY, URINE: 1.006 (ref 1.005–1.030)
SPECIFIC GRAVITY, URINE: 1.005 (ref 1.005–1.030)
pH: 7 (ref 5.0–8.0)

## 2017-01-04 NOTE — MAU Provider Note (Signed)
Chief Complaint: Uterine prolapse  SUBJECTIVE HPI: Leah Stafford is a 83 y.o. [id number] at Unknown who presents to [facility] reporting uterine prolapse.   Patient states she was sent in by her PCP due to uterine prolapse. She says she has been having difficulty trying to find a gynecologist outpatient. She states she's been having uterine prolapse issues for the past 3 years however the past 6 months has noticed that it's been getting worse. She denies any pain, but says that it is a slight bit uncomfortable. Patient states she is able to ambulate despite the prolapsed organ. She states she does not get frequent UTIs but does state that she has urinary frequency and occasionally urinary incontinence. Patient has not been hospitalized in the past year. Per daughter at bedside, she noticed about 6 months ago she had a little bit of vaginal spotting in her underwear, and she noted she was having urinary incontinence, and when she evaluated she noticed how significant uterine prolapse had become.  Patient has had a history of 4 vaginal births, one of them was IUFD. She had a tubal ligation performed that is demonstrated by a 3 cm vertical incision at the low bikini line.  She is a diet controlled diabetic. She has hypertension and hyperlipidemia on medications for these.  Past Medical History:  Diagnosis Date  . Diabetes mellitus without complication ([facility])    Diet and excercise  . Diabetic retinopathy ([place])   . High cholesterol   . Hypertension    OB History  Gravida Para Term Preterm AB Living  4 4 3 1   3   SAB TAB Ectopic Multiple Live Births          3    # Outcome Date GA Lbr Len/2nd Weight Sex Delivery Anes PTL Lv  4 Preterm         FD  3 Term           2 Term           1 Term              No past surgical history on file. Social History   Social History  . Marital status: Widowed    Spouse name: N/A  . Number of children: N/A  . Years of education: N/A    Occupational History  . Not on file.   Social History Main Topics  . Smoking status: Never Smoker  . Smokeless tobacco: Not on file  . Alcohol use No  . Drug use: No  . Sexual activity: Not on file   Other Topics Concern  . Not on file   Social History Narrative  . No narrative on file   No current facility-administered medications on file prior to encounter.    Current Outpatient Prescriptions on File Prior to Encounter  Medication Sig Dispense Refill  . aspirin 81 MG tablet Take 81 mg by mouth daily.     . carvedilol (COREG) 12.5 MG tablet Take 12.5 mg by mouth 2 (two) times daily with a meal.    . lisinopril-hydrochlorothiazide (PRINZIDE,ZESTORETIC) 20-12.5 MG per tablet Take 1 tablet by mouth daily.    . Multiple Vitamin (MULTIVITAMIN WITH MINERALS) TABS tablet Take 1 tablet by mouth daily.    . pravastatin (PRAVACHOL) 10 MG tablet Take 10 mg by mouth daily.     Allergies  Allergen Reactions  . Codeine Nausea And Vomiting    I have reviewed the past Medical Hx, Surgical Hx, Social Hx, Allergies  and Medications.   REVIEW OF SYSTEMS  A comprehensive ROS was negative except per HPI.    OBJECTIVE Patient Vitals for the past 24 hrs:  BP Temp Temp src Pulse Resp  01/04/17 1331 159/58 98.5 F (36.9 C) Oral (!) 54 18    PHYSICAL EXAM Constitutional: Well-developed, well-nourished female in no acute distress.  Cardiovascular: normal rate, rhythm, no murmurs Respiratory: normal rate and effort. CTAB GI: Abd soft, non-tender, non-distended. Pos BS x 4. Vertical incisional scar on her low bikini line. MS: Extremities nontender, no edema, normal ROM Neurologic: Alert and oriented x 4. Ambulates well without assistance. GU: Neg CVAT. Complete pelvic organ prolapse with pink, atrophic, dry appearing cervix and vaginal mucosa.  Unable to fully reduce due to pain when prolapse was about half way returned into vagina. No bleeding noted. After verbal consent was obtained,  bladder decompression with a urinary catheter and pessary placement was performed. 800cc of clear yellow urine was drained via straight catheterization in the typical sterile procedure (due to post void residual showing 750cc x2 attempts). After urine was drained, a reduction of her prolapsed pelvic organs was performed by Dr. Harolyn Rutherford, and a size 5 Ring pessary with support was placed. Patient ambulated and was able to maintain her pessary in place. There was some minimal vaginal bleeding after reduction and pessary placement. Patient tolerated procedure well.   LAB RESULTS Results for orders placed or performed during the hospital encounter of 01/04/17 (from the past 24 hour(s))  Urinalysis, Routine w reflex microscopic     Status: None   Collection Time: 01/04/17  3:38 PM  Result Value Ref Range   Color, Urine YELLOW YELLOW   APPearance CLEAR CLEAR   Specific Gravity, Urine 1.006 1.005 - 1.030   pH 7.0 5.0 - 8.0   Glucose, UA NEGATIVE NEGATIVE mg/dL   Hgb urine dipstick NEGATIVE NEGATIVE   Bilirubin Urine NEGATIVE NEGATIVE   Ketones, ur NEGATIVE NEGATIVE mg/dL   Protein, ur NEGATIVE NEGATIVE mg/dL   Nitrite NEGATIVE NEGATIVE   Leukocytes, UA NEGATIVE NEGATIVE  Urinalysis, Routine w reflex microscopic     Status: Abnormal   Collection Time: 01/04/17  4:40 PM  Result Value Ref Range   Color, Urine STRAW (A) YELLOW   APPearance CLEAR CLEAR   Specific Gravity, Urine 1.005 1.005 - 1.030   pH 7.0 5.0 - 8.0   Glucose, UA NEGATIVE NEGATIVE mg/dL   Hgb urine dipstick NEGATIVE NEGATIVE   Bilirubin Urine NEGATIVE NEGATIVE   Ketones, ur NEGATIVE NEGATIVE mg/dL   Protein, ur NEGATIVE NEGATIVE mg/dL   Nitrite NEGATIVE NEGATIVE   Leukocytes, UA NEGATIVE NEGATIVE  Basic metabolic panel     Status: Abnormal   Collection Time: 01/04/17  5:01 PM  Result Value Ref Range   Sodium 138 135 - 145 mmol/L   Potassium 4.1 3.5 - 5.1 mmol/L   Chloride 104 101 - 111 mmol/L   CO2 27 22 - 32 mmol/L    Glucose, Bld 100 (H) 65 - 99 mg/dL   BUN 16 6 - 20 mg/dL   Creatinine, Ser 1.21 (H) 0.44 - 1.00 mg/dL   Calcium 9.5 8.9 - 10.3 mg/dL   GFR calc non Af Amer 41 (L) >60 mL/min   GFR calc Af Amer 48 (L) >60 mL/min   Anion gap 7 5 - 15    IMAGING No results found.  MAU COURSE Vaginal exam UA - straight cath Bladder scan - 750cc;  Straight cath performed emptying 800cc clear yellow  urine. Pessary placement - size 5 Ring pessary with support BMP- Creatinine was 1.21, but is improved compared to 2 years previously  5:51 PM - Spoke with Dr. Glo Herring regarding patient's creatinine level, agreed no need to admit or place a foley catheter, as this is likely mechanical cause and is improved compared to previous creatinine level, likely a chronic kidney disease patient.    MDM Plan of care reviewed with patient, including labs and tests ordered and medical treatment. Warning signs/symptoms to return to ED: ie. Unable to urinate, fevers/chills, worsening pain or bleeding. Discussed with patient if pessary falls out at home, to await visit with Dr. Zigmund Daniel for follow up, no need to return to MAU to have replaced.    ASSESSMENT 1. Third degree uterine prolapse   2. Procidentia of uterus   3. Complete uterovaginal prolapse   4. Urinary retention     PLAN Discharge home in stable condition. Workup to be performed outpatient at [facility] with Dr. Maryland Pink, uro-gyn. Referral made today.  To monitor urine output and to return if unable to urinate or if significant pain occurs. Pessary in place.   Follow-up Information    Marti Sleigh, MD. Call in 1 week(s).   Specialties:  Gynecology, Urology Why:  Follow up for evaluation and management Contact information: [place] [address] [phone]          Allergies as of 01/04/2017      Reactions   Codeine Nausea And Vomiting      Medication List    STOP taking these medications   ibuprofen  200 MG tablet Commonly known as:  ADVIL,MOTRIN     TAKE these medications   aspirin 81 MG tablet Take 81 mg by mouth daily.   carvedilol 12.5 MG tablet Commonly known as:  COREG Take 12.5 mg by mouth 2 (two) times daily with a meal.   DUREZOL 0.05 % Emul Generic drug:  Difluprednate Inject 1 drop into the eye daily.   lisinopril-hydrochlorothiazide 20-12.5 MG tablet Commonly known as:  PRINZIDE,ZESTORETIC Take 1 tablet by mouth daily.   multivitamin with minerals Tabs tablet Take 1 tablet by mouth daily.   pravastatin 10 MG tablet Commonly known as:  PRAVACHOL Take 10 mg by mouth daily.   vitamin C 500 MG tablet Commonly known as:  ASCORBIC ACID Take 500 mg by mouth daily.   VITAMIN D PO Take 5,000 Units by mouth daily. OTC, strength not known        Katherine Basset, DO OB Fellow 01/04/2017 5:51 PM

## 2017-01-04 NOTE — MAU Note (Signed)
Pt denies pain, but is uncomfortable.

## 2017-01-04 NOTE — Discharge Instructions (Signed)
Pelvic Organ Prolapse Introduction Pelvic organ prolapse is the stretching, bulging, or dropping of pelvic organs into an abnormal position. It happens when the muscles and tissues that surround and support pelvic structures are stretched or weak. Pelvic organ prolapse can involve:  Vagina (vaginal prolapse).  Uterus (uterine prolapse).  Bladder (cystocele).  Rectum (rectocele).  Intestines (enterocele). When organs other than the vagina are involved, they often bulge into the vagina or protrude from the vagina, depending on how severe the prolapse is. What are the causes? Causes of this condition include:  Pregnancy, labor, and childbirth.  Long-lasting (chronic) cough.  Chronic constipation.  Obesity.  Past pelvic surgery.  Aging. During and after menopause, a decreased production of the hormone estrogen can weaken pelvic ligaments and muscles.  Consistently lifting more than 50 lb (23 kg).  Buildup of fluid in the abdomen due to certain diseases and other conditions. What are the signs or symptoms? Symptoms of this condition include:  Loss of bladder control when you cough, sneeze, strain, and exercise (stress incontinence). This may be worse immediately following childbirth, and it may gradually improve over time.  Feeling pressure in your pelvis or vagina. This pressure may increase when you cough or when you are having a bowel movement.  A bulge that protrudes from the opening of your vagina or against your vaginal wall. If your uterus protrudes through the opening of your vagina and rubs against your clothing, you may also experience soreness, ulcers, infection, pain, and bleeding.  Increased effort to have a bowel movement or urinate.  Pain in your low back.  Pain, discomfort, or disinterest in sexual intercourse.  Repeated bladder infections (urinary tract infections).  Difficulty inserting or inability to insert a tampon or applicator. In some people, this  condition does not cause any symptoms. How is this diagnosed? Your health care provider may perform an internal and external vaginal and rectal exam. During the exam, you may be asked to cough and strain while you are lying down, sitting, and standing up. Your health care provider will determine if other tests are required, such as bladder function tests. How is this treated? In most cases, this condition needs to be treated only if it produces symptoms. No treatment is guaranteed to correct the prolapse or relieve the symptoms completely. Treatment may include:  Lifestyle changes, such as:  Avoiding drinking beverages that contain caffeine.  Increasing your intake of high-fiber foods. This can help to decrease constipation and straining during bowel movements.  Emptying your bladder at scheduled times (bladder training therapy). This can help to reduce or avoid urinary incontinence.  Losing weight if you are overweight or obese.  Estrogen. Estrogen may help mild prolapse by increasing the strength and tone of pelvic floor muscles.  Kegel exercises. These may help mild cases of prolapse by strengthening and tightening the muscles of the pelvic floor.  Pessary insertion. A pessary is a soft, flexible device that is placed into your vagina by your health care provider to help support the vaginal walls and keep pelvic organs in place.  Surgery. This is often the only form of treatment for severe prolapse. Different types of surgeries are available. Follow these instructions at home:  Wear a sanitary pad or absorbent product if you have urinary incontinence.  Avoid heavy lifting and straining with exercise and work. Do not hold your breath when you perform mild to moderate lifting and exercise activities. Limit your activities as directed by your health care provider.  Take  medicines only as directed by your health care provider.  Perform Kegel exercises as directed by your health care  provider.  If you have a pessary, take care of it as directed by your health care provider. Contact a health care provider if:  Your symptoms interfere with your daily activities or sex life.  You need medicine to help with the discomfort.  You notice bleeding from the vagina that is not related to your period.  You have a fever.  You have pain or bleeding when you urinate.  You have bleeding when you have a bowel movement.  You lose urine when you have sex.  You have chronic constipation.  You have a pessary that falls out.  You have vaginal discharge that has a bad smell.  You have low abdominal pain or cramping that is unusual for you. This information is not intended to replace advice given to you by your health care provider. Make sure you discuss any questions you have with your health care provider. Document Released: 06/09/2014 Document Revised: 04/19/2016 Document Reviewed: 01/25/2014  2017 Elsevier     Acute Urinary Retention, Female Acute urinary retention is the temporary inability to urinate. This is an uncommon problem in women. It can be caused by:  Infection.  A side effect of a medicine.  A problem in a nearby organ that presses or squeezes on the bladder or the urethra (the tube that drains the bladder).  Psychological problems.  Surgery on your bladder, urethra, or pelvic organs that causes obstruction to the outflow of urine from your bladder. Follow these instructions at home: If you are sent home with a Foley catheter and a drainage system, you will need to discuss the best course of action with your health care provider. While the catheter is in, maintain a good intake of fluids. Keep the drainage bag emptied and lower than your catheter. This is so that contaminated urine will not flow back into your bladder, which could lead to a urinary tract infection. There are two main types of drainage bags. One is a large bag that usually is used at  night. It has a good capacity that will allow you to sleep through the night without having to empty it. The second type is called a leg bag. It has a smaller capacity so it needs to be emptied more frequently. However, the main advantage is that it can be attached by a leg strap and goes underneath your clothing, allowing you the freedom to move about or leave your home. Only take over-the-counter or prescription medicines for pain, discomfort, or fever as directed by your health care provider. Contact a health care provider if:  You develop a low-grade fever.  You experience spasms or leakage of urine with the spasms. Get help right away if:  You develop chills or fever.  Your catheter stops draining urine.  Your catheter falls out.  You start to develop increased bleeding that does not respond to rest and increased fluid intake. This information is not intended to replace advice given to you by your health care provider. Make sure you discuss any questions you have with your health care provider. Document Released: 11/11/2006 Document Revised: 04/25/2016 Document Reviewed: 04/23/2013 Elsevier Interactive Patient Education  2017 [employer].

## 2017-01-04 NOTE — MAU Note (Signed)
Pt saw her PCP yesterday, has a uterine prolapse.  Has had a prolapse for 6-9 months.  Denies pain or bleeding.  Denies dysuria, but has frequency.

## 2017-01-04 NOTE — MAU Note (Signed)
Bladder scan 820 ml. Dr. Wetzel Bjornstad made aware

## 2017-01-05 ENCOUNTER — Ambulatory Visit: Payer: Medicare HMO

## 2017-01-08 ENCOUNTER — Telehealth: Payer: Self-pay | Admitting: *Deleted

## 2017-01-08 NOTE — Telephone Encounter (Signed)
Dr. Harolyn Rutherford requested that patient be referred to Dr. [place] Pink. Appointment made for March 28 at 1030 am. She will be seen in the [place] office which is located at: [address] [place], [place] [address].

## 2017-01-10 ENCOUNTER — Other Ambulatory Visit (INDEPENDENT_AMBULATORY_CARE_PROVIDER_SITE_OTHER): Payer: Medicare HMO | Admitting: Ophthalmology

## 2017-01-15 ENCOUNTER — Telehealth: Payer: Self-pay | Admitting: Radiology

## 2017-01-15 NOTE — Telephone Encounter (Signed)
-----   Message from Tarry Kos sent at 01/15/2017 10:12 AM EST ----- Regarding: FW: Gyn Appt needed Please book with Janetta Hora or [place] as Dr. Hulan Fray does not routinely come to [place] ----- Message ----- From: Isaias Sakai, DO Sent: 01/04/2017   2:29 PM To: [facility] [place] Subject: Gyn Appt needed                                Please have patient set up with Dr. Clovia Cuff, if possible, for a grade 3 uterine prolapse care. Seen in MAU 01/04/17.  Thank you  Katherine Basset, DO

## 2017-01-15 NOTE — Telephone Encounter (Signed)
Called to schedule appointment @ [facility] with Dr Hulan Fray but was unable to leave message due to voicemail not being set up. Will Call again later again.

## 2017-01-16 ENCOUNTER — Encounter: Payer: Self-pay | Admitting: *Deleted

## 2017-01-16 ENCOUNTER — Telehealth: Payer: Self-pay | Admitting: Radiology

## 2017-01-16 NOTE — Telephone Encounter (Signed)
2nd attempt on calling pt to schedule appointment with Dr Hulan Fray @ [facility]. No answer and can not leave voicemail (not set up).

## 2017-01-16 NOTE — Telephone Encounter (Signed)
-----   Message from Tarry Kos sent at 01/15/2017 10:12 AM EST ----- Regarding: FW: Gyn Appt needed Please book with Janetta Hora or [place] as Dr. Hulan Fray does not routinely come to [place] ----- Message ----- From: Isaias Sakai, DO Sent: 01/04/2017   2:29 PM To: [facility] [place] Subject: Gyn Appt needed                                Please have patient set up with Dr. Clovia Cuff, if possible, for a grade 3 uterine prolapse care. Seen in MAU 01/04/17.  Thank you  Katherine Basset, DO

## 2017-01-16 NOTE — Telephone Encounter (Signed)
Letter to patient with appointment information.

## 2017-01-17 ENCOUNTER — Telehealth: Payer: Self-pay | Admitting: Radiology

## 2017-01-17 NOTE — Telephone Encounter (Signed)
Spoke with Pt today about scheduling Appointment @ [facility] with Dr Hulan Fray. She states that she will have her daughter to call the office back to schedule appointment because her daughter is in charge of her transportation. Gave Ms Muratalla the office number, my name and she will pass message to daughter to call to make her appointment.

## 2017-01-17 NOTE — Telephone Encounter (Signed)
-----   Message from Tarry Kos sent at 01/15/2017 10:12 AM EST ----- Regarding: FW: Gyn Appt needed Please book with Janetta Hora or [place] as Dr. Hulan Fray does not routinely come to [place] ----- Message ----- From: Isaias Sakai, DO Sent: 01/04/2017   2:29 PM To: [facility] [place] Subject: Gyn Appt needed                                Please have patient set up with Dr. Clovia Cuff, if possible, for a grade 3 uterine prolapse care. Seen in MAU 01/04/17.  Thank you  Katherine Basset, DO

## 2017-01-31 ENCOUNTER — Other Ambulatory Visit (INDEPENDENT_AMBULATORY_CARE_PROVIDER_SITE_OTHER): Payer: Medicare HMO | Admitting: Ophthalmology

## 2017-02-07 ENCOUNTER — Other Ambulatory Visit (INDEPENDENT_AMBULATORY_CARE_PROVIDER_SITE_OTHER): Payer: Medicare HMO | Admitting: Ophthalmology

## 2017-02-07 DIAGNOSIS — E11311 Type 2 diabetes mellitus with unspecified diabetic retinopathy with macular edema: Secondary | ICD-10-CM

## 2017-02-07 DIAGNOSIS — E113512 Type 2 diabetes mellitus with proliferative diabetic retinopathy with macular edema, left eye: Secondary | ICD-10-CM

## 2017-02-12 ENCOUNTER — Ambulatory Visit (INDEPENDENT_AMBULATORY_CARE_PROVIDER_SITE_OTHER): Payer: Medicare HMO | Admitting: Family Medicine

## 2017-02-12 ENCOUNTER — Encounter: Payer: Self-pay | Admitting: Family Medicine

## 2017-02-12 VITALS — BP 134/60 | HR 58 | Temp 98.6°F | Resp 16 | Ht 61.0 in | Wt 145.0 lb

## 2017-02-12 DIAGNOSIS — Z961 Presence of intraocular lens: Secondary | ICD-10-CM

## 2017-02-12 DIAGNOSIS — I10 Essential (primary) hypertension: Secondary | ICD-10-CM

## 2017-02-12 DIAGNOSIS — H4311 Vitreous hemorrhage, right eye: Secondary | ICD-10-CM | POA: Diagnosis not present

## 2017-02-12 DIAGNOSIS — N289 Disorder of kidney and ureter, unspecified: Secondary | ICD-10-CM

## 2017-02-12 DIAGNOSIS — G4701 Insomnia due to medical condition: Secondary | ICD-10-CM

## 2017-02-12 DIAGNOSIS — E7800 Pure hypercholesterolemia, unspecified: Secondary | ICD-10-CM

## 2017-02-12 DIAGNOSIS — M7552 Bursitis of left shoulder: Secondary | ICD-10-CM

## 2017-02-12 DIAGNOSIS — E113593 Type 2 diabetes mellitus with proliferative diabetic retinopathy without macular edema, bilateral: Secondary | ICD-10-CM

## 2017-02-12 DIAGNOSIS — Z794 Long term (current) use of insulin: Secondary | ICD-10-CM

## 2017-02-12 MED ORDER — MIRTAZAPINE 7.5 MG PO TABS: 7.5000 mg | ORAL_TABLET | Freq: Every day | ORAL | 2 refills | Status: DC

## 2017-02-12 NOTE — Progress Notes (Signed)
Subjective:    Patient ID: Leah Stafford, female    DOB: 1934/06/05, 84 y.o.   MRN: [id number]  02/12/2017  Establish Care   HPI This 83 y.o. female presents with daughter to establish care.    Last physical: 2 years ago. Pap smear: years ago; prolapsed uterus; has appointment Leah Stafford 02/20/17.   Mammogram:  Last mammogram 2 years ago.  Colonoscopy: none Bone density: never Eye exam: regularly/annually   DMII: no medication in five years.  Changed diet.  Can check sugars at home.   L shoulder bursitis: diagnosed several years ago; recent worsening in past two weeks.  Now improved.  Still swollen.  Requesting evaluation.   Prolapsed uterus with bladder issues: followed by Dr. Zigmund Stafford.  No specific plan; daughter wants hysterectomy.  Tugging on bladder.  Admission in 12/2016.    Hypertension: Patient reports good compliance with medication, good tolerance to medication, and good symptom control.    Hypercholesterolemia: Patient reports good compliance with medication, good tolerance to medication, and good symptom control.    Immunization History  Administered Date(s) Administered  . Pneumococcal Conjugate-13 11/17/2014  . Pneumococcal Polysaccharide-23 09/18/2013   BP Readings from Last 3 Encounters:  02/12/17 134/60  01/04/17 162/65  02/14/16 125/62   Wt Readings from Last 3 Encounters:  02/12/17 145 lb (65.8 kg)    Pneumococcal Conjugate 13-valent - PREVNAR 11/17/2014  . Pneumococcal Polysaccharide 23-valent - PNEUMOVAX 09/16/2013   Health Maintenance Status  Date Due Completion Dates  MAMMOGRAM 02/29/1976 ---  COLONOSCOPY 02/28/1986 ---  INFLUENZA VACCINE 07/28/2015 ---  Medicare Annual Wellness Visit 12/04/2015 12/03/2014    Review of Systems  Constitutional: Negative for activity change, appetite change, chills, diaphoresis, fatigue, fever and unexpected weight change.  HENT: Negative for congestion, dental problem, drooling, ear discharge, ear pain, facial  swelling, hearing loss, mouth sores, nosebleeds, postnasal drip, rhinorrhea, sinus pressure, sneezing, sore throat, tinnitus, trouble swallowing and voice change.   Eyes: Negative for photophobia, pain, discharge, redness, itching and visual disturbance.  Respiratory: Negative for apnea, cough, choking, chest tightness, shortness of breath, wheezing and stridor.   Cardiovascular: Negative for chest pain, palpitations and leg swelling.  Gastrointestinal: Negative for abdominal distention, abdominal pain, anal bleeding, blood in stool, constipation, diarrhea, nausea, rectal pain and vomiting.  Endocrine: Negative for cold intolerance, heat intolerance, polydipsia, polyphagia and polyuria.  Genitourinary: Negative for decreased urine volume, difficulty urinating, dyspareunia, dysuria, enuresis, flank pain, frequency, genital sores, hematuria, menstrual problem, pelvic pain, urgency, vaginal bleeding, vaginal discharge and vaginal pain.  Musculoskeletal: Positive for arthralgias. Negative for back pain, gait problem, joint swelling, myalgias, neck pain and neck stiffness.  Skin: Negative for color change, pallor, rash and wound.  Allergic/Immunologic: Negative for environmental allergies, food allergies and immunocompromised state.  Neurological: Negative for dizziness, tremors, seizures, syncope, facial asymmetry, speech difficulty, weakness, light-headedness, numbness and headaches.  Hematological: Negative for adenopathy. Does not bruise/bleed easily.  Psychiatric/Behavioral: Positive for dysphoric mood and sleep disturbance. Negative for agitation, behavioral problems, confusion, decreased concentration, hallucinations, self-injury and suicidal ideas. The patient is not nervous/anxious and is not hyperactive.     Past Medical History:  Diagnosis Date  . Arthritis   . Cataract   . Depression   . Diabetes mellitus without complication (HCC)    Diet and excercise  . Diabetic retinopathy ([place])   .  High cholesterol   . Hypertension    Past Surgical History:  Procedure Laterality Date  . EYE SURGERY    . TUBAL LIGATION  Allergies  Allergen Reactions  . Codeine Nausea And Vomiting    Social History   Social History  . Marital status: Widowed    Spouse name: N/A  . Number of children: 3  . Years of education: N/A   Occupational History  . retired    Social History Main Topics  . Smoking status: Never Smoker  . Smokeless tobacco: Former Systems developer  . Alcohol use No  . Drug use: No  . Sexual activity: Not Currently   Other Topics Concern  . Not on file   Social History Narrative   Marital status: widowed; not dating in 2018      Children: 3 children; 9 grandchildren; 8 gg.      Lives: with daughter      Employment: retired.       Tobacco: none      Alcohol: none      Exercise: previous walking; no walking during winter months.      ADLs: no driving.  Cleans, washes clothes; cleans house; pays bills.  Grocery shopping with daughter      Advanced Directives:  None;  FULL CODE; daughter HCPOA.         Family History  Problem Relation Age of Onset  . Cancer Father   . Hyperlipidemia Sister   . Hypertension Sister   . Hypertension Brother   . Hyperlipidemia Brother   . Heart disease Brother        Objective:    BP 134/60   Pulse (!) 58   Temp 98.6 F (37 C) (Oral)   Resp 16   Ht 5\' 1"  (1.549 m)   Wt 145 lb (65.8 kg)   SpO2 100%   BMI 27.40 kg/m  Physical Exam  Constitutional: She is oriented to person, place, and time. She appears well-developed and well-nourished. No distress.  HENT:  Head: Normocephalic and atraumatic.  Right Ear: External ear normal.  Left Ear: External ear normal.  Nose: Nose normal.  Mouth/Throat: Oropharynx is clear and moist.  Eyes: Conjunctivae and EOM are normal. Pupils are equal, round, and reactive to light.  Neck: Normal range of motion and full passive range of motion without pain. Neck supple. No JVD present.  Carotid bruit is not present. No thyromegaly present.  Cardiovascular: Normal rate, regular rhythm and normal heart sounds.  Exam reveals no gallop and no friction rub.   No murmur heard. Pulmonary/Chest: Effort normal and breath sounds normal. She has no wheezes. She has no rales.  Abdominal: Soft. Bowel sounds are normal. She exhibits no distension and no mass. There is no tenderness. There is no rebound and no guarding.  Musculoskeletal:       Right shoulder: Normal.       Left shoulder: Normal.       Cervical back: Normal.  Lymphadenopathy:    She has no cervical adenopathy.  Neurological: She is alert and oriented to person, place, and time. She has normal reflexes. No cranial nerve deficit. She exhibits normal muscle tone. Coordination normal.  Skin: Skin is warm and dry. No rash noted. She is not diaphoretic. No erythema. No pallor.  Psychiatric: She has a normal mood and affect. Her behavior is normal. Judgment and thought content normal.  Nursing note and vitals reviewed.   Depression screen PHQ 2/9 02/12/2017  Decreased Interest 0  Down, Depressed, Hopeless 0  PHQ - 2 Score 0   Fall Risk  02/12/2017  Falls in the past year? No  Assessment & Plan:   1. 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)   2. Essential hypertension   3. Renal insufficiency   4. Vitreous hemorrhage of right eye ([place])   5. Pseudophakia of both eyes   6. Pure hypercholesterolemia   7. Acute bursitis of left shoulder   8. Insomnia due to medical condition    -diabetes controlled with dietary modification only at this time; obtain labs; return in 3 months. -hypertension controlled; obtain labs; continue medications. -tolerating statin therapy at this time; obtain labs. -recent L shoulder bursitis; excellent range of motion at this time; continue with regular exercise; will obtain xray if  persists. -not sleeping well; recommend trial of Remeron for insomnia; recent death of brother may be contributing to insomnia.    Orders Placed This Encounter  Procedures  . CBC with Differential/Platelet  . Comprehensive metabolic panel  . Hemoglobin A1c  . Iron and TIBC  . Specimen status report  . HM Diabetes Foot Exam   Meds ordered this encounter  Medications  . DISCONTD: mirtazapine (REMERON) 7.5 MG tablet    Sig: Take 1 tablet (7.5 mg total) by mouth at bedtime.    Dispense:  30 tablet    Refill:  2    Return in about 3 months (around 05/15/2017) for recheck high blood pressure.   Leah Stafford Elayne Guerin, M.D. Primary Care at [facility] [place] [address] [place],   [phone] phone [phone] fax

## 2017-02-12 NOTE — Patient Instructions (Addendum)
IF you received an x-ray today, you will receive an invoice from [facility]. Please contact [facility] Radiology at [phone] with questions or concerns regarding your invoice.   IF you received labwork today, you will receive an invoice from [facility]. Please contact [facility] at [phone] with questions or concerns regarding your invoice.   Our billing staff will not be able to assist you with questions regarding bills from these companies.  You will be contacted with the lab results as soon as they are available. The fastest way to get your results is to activate your My Chart account. Instructions are located on the last page of this paperwork. If you have not heard from [place] regarding the results in 2 weeks, please contact this office.    'rotato Shoulder Impingement Syndrome Rehab Ask your health care provider which exercises are safe for you. Do exercises exactly as told by your health care provider and adjust them as directed. It is normal to feel mild stretching, pulling, tightness, or discomfort as you do these exercises, but you should stop right away if you feel sudden pain or your pain gets worse.Do not begin these exercises until told by your health care provider. Stretching and range of motion exercise This exercise warms up your muscles and joints and improves the movement and flexibility of your shoulder. This exercise also helps to relieve pain and stiffness. Exercise A: Passive horizontal adduction   1. Sit or stand and pull your left / right elbow across your chest, toward your other shoulder. Stop when you feel a gentle stretch in the back of your shoulder and upper arm.  Keep your arm at shoulder height.  Keep your arm as close to your body as you comfortably can. 2. Hold for __________ seconds. 3. Slowly return to the starting position. Repeat __________ times. Complete this exercise __________ times a day. Strengthening exercises These exercises  build strength and endurance in your shoulder. Endurance is the ability to use your muscles for a long time, even after they get tired. Exercise B: External rotation, isometric  1. Stand or sit in a doorway, facing the door frame. 2. Bend your left / right elbow and place the back of your wrist against the door frame. Only your wrist should be touching the frame. Keep your upper arm at your side. 3. Gently press your wrist against the door frame, as if you are trying to push your arm away from your abdomen.  Avoid shrugging your shoulder while you press your hand against the door frame. Keep your shoulder blade tucked down toward the middle of your back. 4. Hold for __________ seconds. 5. Slowly release the tension, and relax your muscles completely before you do the exercise again. Repeat __________ times. Complete this exercise __________ times a day. Exercise C: Internal rotation, isometric   1. Stand or sit in a doorway, facing the door frame. 2. Bend your left / right elbow and place the inside of your wrist against the door frame. Only your wrist should be touching the frame. Keep your upper arm at your side. 3. Gently press your wrist against the door frame, as if you are trying to push your arm toward your abdomen.  Avoid shrugging your shoulder while you press your hand against the door frame. Keep your shoulder blade tucked down toward the middle of your back. 4. Hold for __________ seconds. 5. Slowly release the tension, and relax your muscles completely before you do the exercise again.  Repeat __________ times. Complete this exercise __________ times a day. Exercise D: Scapular protraction, supine   1. Lie on your back on a firm surface. Hold a __________ weight in your left / right hand. 2. Raise your left / right arm straight into the air so your hand is directly above your shoulder joint. 3. Push the weight into the air so your shoulder lifts off of the surface that you are  lying on. Do not move your head, neck, or back. 4. Hold for __________ seconds. 5. Slowly return to the starting position. Let your muscles relax completely before you repeat this exercise. Repeat __________ times. Complete this exercise __________ times a day. Exercise E: Scapular retraction   1. Sit in a stable chair without armrests, or stand. 2. Secure an exercise band to a stable object in front of you so the band is at shoulder height. 3. Hold one end of the exercise band in each hand. Your palms should face down. 4. Squeeze your shoulder blades together and move your elbows slightly behind you. Do not shrug your shoulders while you do this. 5. Hold for __________ seconds. 6. Slowly return to the starting position. Repeat __________ times. Complete this exercise __________ times a day. Exercise F: Shoulder extension   1. Sit in a stable chair without armrests, or stand. 2. Secure an exercise band to a stable object in front of you where the band is above shoulder height. 3. Hold one end of the exercise band in each hand. 4. Straighten your elbows and lift your hands up to shoulder height. 5. Squeeze your shoulder blades together and pull your hands down to the sides of your thighs. Stop when your hands are straight down by your sides. Do not let your hands go behind your body. 6. Hold for __________ seconds. 7. Slowly return to the starting position. Repeat __________ times. Complete this exercise __________ times a day. This information is not intended to replace advice given to you by your health care provider. Make sure you discuss any questions you have with your health care provider. Document Released: 11/12/2005 Document Revised: 07/19/2016 Document Reviewed: 10/15/2015 Elsevier Interactive Patient Education  2017 [employer].

## 2017-02-13 ENCOUNTER — Encounter: Payer: Self-pay | Admitting: *Deleted

## 2017-02-13 ENCOUNTER — Telehealth: Payer: Self-pay | Admitting: Family Medicine

## 2017-02-13 ENCOUNTER — Encounter: Payer: Self-pay | Admitting: Family Medicine

## 2017-02-13 LAB — CBC WITH DIFFERENTIAL/PLATELET
Basophils Absolute: 0.1 10*3/uL (ref 0.0–0.2)
Basos: 1 %
EOS (ABSOLUTE): 0.3 10*3/uL (ref 0.0–0.4)
EOS: 5 %
HEMATOCRIT: 31 % — AB (ref 34.0–46.6)
HEMOGLOBIN: 10.5 g/dL — AB (ref 11.1–15.9)
IMMATURE GRANS (ABS): 0 10*3/uL (ref 0.0–0.1)
Immature Granulocytes: 0 %
LYMPHS: 29 %
Lymphocytes Absolute: 1.6 10*3/uL (ref 0.7–3.1)
MCH: 31.1 pg (ref 26.6–33.0)
MCHC: 33.9 g/dL (ref 31.5–35.7)
MCV: 92 fL (ref 79–97)
Monocytes Absolute: 0.6 10*3/uL (ref 0.1–0.9)
Monocytes: 11 %
Neutrophils Absolute: 2.9 10*3/uL (ref 1.4–7.0)
Neutrophils: 54 %
Platelets: 352 10*3/uL (ref 150–379)
RBC: 3.38 x10E6/uL — AB (ref 3.77–5.28)
RDW: 13.2 % (ref 12.3–15.4)
WBC: 5.4 10*3/uL (ref 3.4–10.8)

## 2017-02-13 LAB — COMPREHENSIVE METABOLIC PANEL
A/G RATIO: 1.1 — AB (ref 1.2–2.2)
ALBUMIN: 3.9 g/dL (ref 3.5–4.7)
ALK PHOS: 87 IU/L (ref 39–117)
ALT: 11 IU/L (ref 0–32)
AST: 18 IU/L (ref 0–40)
BILIRUBIN TOTAL: 0.4 mg/dL (ref 0.0–1.2)
BUN / CREAT RATIO: 17 (ref 12–28)
BUN: 23 mg/dL (ref 8–27)
CHLORIDE: 94 mmol/L — AB (ref 96–106)
CO2: 26 mmol/L (ref 18–29)
Calcium: 9.4 mg/dL (ref 8.7–10.3)
Creatinine, Ser: 1.36 mg/dL — ABNORMAL HIGH (ref 0.57–1.00)
GFR calc non Af Amer: 37 mL/min/{1.73_m2} — ABNORMAL LOW (ref >59)
GFR, EST AFRICAN AMERICAN: 42 mL/min/{1.73_m2} — AB (ref >59)
GLUCOSE: 135 mg/dL — AB (ref 65–99)
Globulin, Total: 3.5 g/dL (ref 1.5–4.5)
POTASSIUM: 4.3 mmol/L (ref 3.5–5.2)
SODIUM: 133 mmol/L — AB (ref 134–144)
TOTAL PROTEIN: 7.4 g/dL (ref 6.0–8.5)

## 2017-02-13 LAB — HEMOGLOBIN A1C
Est. average glucose Bld gHb Est-mCnc: 140 mg/dL
HEMOGLOBIN A1C: 6.5 % — AB (ref 4.8–5.6)

## 2017-02-13 NOTE — Telephone Encounter (Signed)
Patients daughter needs FMLA forms completed by Dr Tamala Julian for taking care of her mother. I was not sure what to write so I left most of it blank. I will place the forms in your box on 02/13/17 please return them to the FMLA/Disability box at the [employer] checkout desk within 5-7 business days! Thank you

## 2017-02-15 LAB — IRON AND TIBC
IRON: 50 ug/dL (ref 27–139)
Iron Saturation: 19 % (ref 15–55)
Total Iron Binding Capacity: 257 ug/dL (ref 250–450)
UIBC: 207 ug/dL (ref 118–369)

## 2017-02-15 LAB — SPECIMEN STATUS REPORT

## 2017-02-18 NOTE — Telephone Encounter (Signed)
FMLA paperwork completed; will drop forms off at [facility] desk this afternoon for processing.

## 2017-02-18 NOTE — Telephone Encounter (Signed)
Paperwork scanned and faxed to [employer] on 02/18/17

## 2017-02-20 DIAGNOSIS — N813 Complete uterovaginal prolapse: Secondary | ICD-10-CM | POA: Insufficient documentation

## 2017-02-21 ENCOUNTER — Telehealth: Payer: Self-pay | Admitting: Family Medicine

## 2017-02-21 NOTE — Telephone Encounter (Signed)
MyChart sent to patient about FMLA Forms

## 2017-03-06 DIAGNOSIS — R8290 Unspecified abnormal findings in urine: Secondary | ICD-10-CM | POA: Diagnosis not present

## 2017-03-06 DIAGNOSIS — N813 Complete uterovaginal prolapse: Secondary | ICD-10-CM | POA: Diagnosis not present

## 2017-03-09 DIAGNOSIS — Z6824 Body mass index (BMI) 24.0-24.9, adult: Secondary | ICD-10-CM | POA: Diagnosis not present

## 2017-03-09 DIAGNOSIS — E785 Hyperlipidemia, unspecified: Secondary | ICD-10-CM | POA: Diagnosis not present

## 2017-03-09 DIAGNOSIS — R32 Unspecified urinary incontinence: Secondary | ICD-10-CM | POA: Diagnosis not present

## 2017-03-09 DIAGNOSIS — H9110 Presbycusis, unspecified ear: Secondary | ICD-10-CM | POA: Diagnosis not present

## 2017-03-09 DIAGNOSIS — M158 Other polyosteoarthritis: Secondary | ICD-10-CM | POA: Diagnosis not present

## 2017-03-09 DIAGNOSIS — I10 Essential (primary) hypertension: Secondary | ICD-10-CM | POA: Diagnosis not present

## 2017-03-09 DIAGNOSIS — Z7982 Long term (current) use of aspirin: Secondary | ICD-10-CM | POA: Diagnosis not present

## 2017-03-09 DIAGNOSIS — E11319 Type 2 diabetes mellitus with unspecified diabetic retinopathy without macular edema: Secondary | ICD-10-CM | POA: Diagnosis not present

## 2017-03-09 DIAGNOSIS — G4700 Insomnia, unspecified: Secondary | ICD-10-CM | POA: Diagnosis not present

## 2017-03-15 ENCOUNTER — Other Ambulatory Visit: Payer: Self-pay | Admitting: Family Medicine

## 2017-03-15 NOTE — Telephone Encounter (Signed)
PATIENT WOULD LIKE DR. Tamala Julian TO KNOW THAT SHE NEEDS A REFILL ON HER MIRTAZAPINE (REMERON) 7.5 MG. SHE WOULD LIKE ABOUT 10 PILLS CALLED [facility] LOCAL [facility] ON [address]. IT WILL TAKE ABOUT 7 TO 10 DAYS FOR HER FULL PRESCRIPTION TO ARRIVE WHEN WE CALL IT INTO [employer] [facility]. BEST PHONE [phone] (CELL) [place]

## 2017-03-15 NOTE — Telephone Encounter (Signed)
Last refill to [facility] 01/2017

## 2017-03-18 MED ORDER — MIRTAZAPINE 7.5 MG PO TABS: 7.5000 mg | ORAL_TABLET | Freq: Every day | ORAL | 0 refills | Status: DC

## 2017-03-22 ENCOUNTER — Other Ambulatory Visit: Payer: Self-pay

## 2017-03-22 MED ORDER — MIRTAZAPINE 7.5 MG PO TABS: 7.5000 mg | ORAL_TABLET | Freq: Every day | ORAL | 2 refills | Status: DC

## 2017-03-22 NOTE — Telephone Encounter (Signed)
Fax req [employer] to clarify  Mirtazapine 7.5 rx  3/20, pt given rx for #30 with 2 refills- sent to [facility] Mail order  03/18/2017, rx [facility] [address] to [facility] until pt's mail order supply was delivered.  Clarified the 3/20 order by reordering with [facility].

## 2017-04-01 ENCOUNTER — Telehealth: Payer: Self-pay | Admitting: Family Medicine

## 2017-04-01 MED ORDER — LISINOPRIL-HYDROCHLOROTHIAZIDE 20-12.5 MG PO TABS: 1.0000 | ORAL_TABLET | Freq: Every day | ORAL | 0 refills | Status: DC

## 2017-04-01 NOTE — Telephone Encounter (Signed)
PATIENT WOULD  LIKE DR. Tamala Julian TO KNOW THAT SHE NEEDS A REFILL ON HER LISINOPRIL-HCTZ 20-12.5 MG. PLEASE CALL IT INTO HER [employer] [facility]Y. BEST PHONE [phone] (CELL) [place]

## 2017-04-23 ENCOUNTER — Ambulatory Visit (INDEPENDENT_AMBULATORY_CARE_PROVIDER_SITE_OTHER): Payer: Medicare HMO

## 2017-04-23 ENCOUNTER — Ambulatory Visit (INDEPENDENT_AMBULATORY_CARE_PROVIDER_SITE_OTHER): Payer: Medicare HMO | Admitting: Family Medicine

## 2017-04-23 ENCOUNTER — Telehealth: Payer: Self-pay

## 2017-04-23 ENCOUNTER — Encounter: Payer: Self-pay | Admitting: Family Medicine

## 2017-04-23 VITALS — BP 128/63 | HR 59 | Temp 98.0°F | Resp 18 | Ht 61.0 in | Wt 154.8 lb

## 2017-04-23 DIAGNOSIS — F4321 Adjustment disorder with depressed mood: Secondary | ICD-10-CM

## 2017-04-23 DIAGNOSIS — E113593 Type 2 diabetes mellitus with proliferative diabetic retinopathy without macular edema, bilateral: Secondary | ICD-10-CM | POA: Diagnosis not present

## 2017-04-23 DIAGNOSIS — N289 Disorder of kidney and ureter, unspecified: Secondary | ICD-10-CM

## 2017-04-23 DIAGNOSIS — M25562 Pain in left knee: Secondary | ICD-10-CM | POA: Diagnosis not present

## 2017-04-23 DIAGNOSIS — Z794 Long term (current) use of insulin: Secondary | ICD-10-CM

## 2017-04-23 DIAGNOSIS — M1712 Unilateral primary osteoarthritis, left knee: Secondary | ICD-10-CM | POA: Diagnosis not present

## 2017-04-23 MED ORDER — DICLOFENAC SODIUM 1 % TD GEL: 4.0000 g | Freq: Four times a day (QID) | TRANSDERMAL | 1 refills | Status: DC

## 2017-04-23 MED ORDER — TRAMADOL HCL 50 MG PO TABS: 50.0000 mg | ORAL_TABLET | Freq: Three times a day (TID) | ORAL | 0 refills | Status: DC | PRN

## 2017-04-23 MED ORDER — PREDNISONE 20 MG PO TABS: ORAL_TABLET | ORAL | 0 refills | Status: DC

## 2017-04-23 NOTE — Patient Instructions (Addendum)
  TAKE TYLENOL EXTRA STRENGTH 2 TABLETS FOUR TIMES DAILY AS NEEDED FOR PAIN.    IF you received an x-ray today, you will receive an invoice from [facility] Radiology. Please contact [facility]iology at [phone] with questions or concerns regarding your invoice.   IF you received labwork today, you will receive an invoice from [place]. Please contact [facility] at [phone] with questions or concerns regarding your invoice.   Our billing staff will not be able to assist you with questions regarding bills from these companies.  You will be contacted with the lab results as soon as they are available. The fastest way to get your results is to activate your My Chart account. Instructions are located on the last page of this paperwork. If you have not heard from [place] regarding the results in 2 weeks, please contact this office.

## 2017-04-23 NOTE — Progress Notes (Signed)
Subjective:    Patient ID: Leah Stafford, female    DOB: 06/30/1934, 83 y.o.   MRN: [id number]  04/23/2017  Knee Pain (L knee pain and swollen. Not so much pain but uncomfortable )   HPI This 83 y.o. female presents for evaluation of LEFT knee pain with swelling.  Now having LEFT knee pain.  No injury. No falls.  S/p RIGHT knee surgery in 1970s.  Minimal pain; mostly stiffness and mostly swelling.  No pain to complain about.  Stiffness when getting up. Must be careful when walking.  Waits a second before walking.  Renal insufficiency with creatinine of 1.36 at last visit.   Has been taking Percocet remaining from 2015.  No recent surgery; no chest pain, palpitations, SOB/orthopnea.    Left shoulder pain  has resolved.  Started taking Remeron at last visit for insomnia and depression. Doing well.  Emotionally doing better.   BP Readings from Last 3 Encounters:  04/23/17 128/63  02/12/17 134/60  01/04/17 162/65   Wt Readings from Last 3 Encounters:  04/23/17 154 lb 12.8 oz (70.2 kg)  02/12/17 145 lb (65.8 kg)   Immunization History  Administered Date(s) Administered  . Pneumococcal Conjugate-13 11/17/2014  . Pneumococcal Polysaccharide-23 09/18/2013    Review of Systems  Constitutional: Negative for chills, diaphoresis, fatigue and fever.  HENT: Negative for ear pain, postnasal drip, rhinorrhea, sinus pressure, sore throat and trouble swallowing.   Respiratory: Negative for cough and shortness of breath.   Cardiovascular: Negative for chest pain, palpitations and leg swelling.  Gastrointestinal: Negative for abdominal pain, constipation, diarrhea, nausea and vomiting.  Musculoskeletal: Positive for arthralgias, gait problem and joint swelling.  Psychiatric/Behavioral: Positive for dysphoric mood and sleep disturbance. Negative for self-injury and suicidal ideas.    Past Medical History:  Diagnosis Date  . Arthritis   . Cataract   . Depression   . Diabetes mellitus  without complication (HCC)    Diet and excercise  . Diabetic retinopathy ([place])   . High cholesterol   . Hypertension    Past Surgical History:  Procedure Laterality Date  . EYE SURGERY    . TUBAL LIGATION     Allergies  Allergen Reactions  . Codeine Nausea And Vomiting     Family History  Problem Relation Age of Onset  . Cancer Father   . Hyperlipidemia Sister   . Hypertension Sister   . Hypertension Brother   . Hyperlipidemia Brother   . Heart disease Brother        Objective:    BP 128/63   Pulse (!) 59   Temp 98 F (36.7 C) (Oral)   Resp 18   Ht 5\' 1"  (1.549 m)   Wt 154 lb 12.8 oz (70.2 kg)   SpO2 99%   BMI 29.25 kg/m  Physical Exam  Constitutional: She is oriented to person, place, and time. She appears well-developed and well-nourished. No distress.  HENT:  Head: Normocephalic and atraumatic.  Eyes: Conjunctivae are normal. Pupils are equal, round, and reactive to light.  Neck: Normal range of motion. Neck supple.  Cardiovascular: Normal rate, regular rhythm and normal heart sounds.  Exam reveals no gallop and no friction rub.   No murmur heard. 1+ pitting and non-pitting edema B lower extremities.  Pulmonary/Chest: Effort normal and breath sounds normal. She has no wheezes. She has no rales.  Musculoskeletal: She exhibits edema.       Left knee: She exhibits decreased range of motion, swelling and effusion. She  exhibits no ecchymosis, no deformity, no laceration, no erythema, normal alignment and no bony tenderness. No tenderness found. No medial joint line, no lateral joint line, no MCL, no LCL and no patellar tendon tenderness noted.  Neurological: She is alert and oriented to person, place, and time.  Skin: She is not diaphoretic.  Psychiatric: She has a normal mood and affect. Her behavior is normal.  Nursing note and vitals reviewed.  Results for orders placed or performed in visit on 02/12/17  CBC with Differential/Platelet  Result Value Ref  Range   WBC 5.4 3.4 - 10.8 x10E3/uL   RBC 3.38 (L) 3.77 - 5.28 x10E6/uL   Hemoglobin 10.5 (L) 11.1 - 15.9 g/dL   Hematocrit 31.0 (L) 34.0 - 46.6 %   MCV 92 79 - 97 fL   MCH 31.1 26.6 - 33.0 pg   MCHC 33.9 31.5 - 35.7 g/dL   RDW 13.2 12.3 - 15.4 %   Platelets 352 150 - 379 x10E3/uL   Neutrophils 54 Not Estab. %   Lymphs 29 Not Estab. %   Monocytes 11 Not Estab. %   Eos 5 Not Estab. %   Basos 1 Not Estab. %   Neutrophils Absolute 2.9 1.4 - 7.0 x10E3/uL   Lymphocytes Absolute 1.6 0.7 - 3.1 x10E3/uL   Monocytes Absolute 0.6 0.1 - 0.9 x10E3/uL   EOS (ABSOLUTE) 0.3 0.0 - 0.4 x10E3/uL   Basophils Absolute 0.1 0.0 - 0.2 x10E3/uL   Immature Granulocytes 0 Not Estab. %   Immature Grans (Abs) 0.0 0.0 - 0.1 x10E3/uL  Comprehensive metabolic panel  Result Value Ref Range   Glucose 135 (H) 65 - 99 mg/dL   BUN 23 8 - 27 mg/dL   Creatinine, Ser 1.36 (H) 0.57 - 1.00 mg/dL   GFR calc non Af Amer 37 (L) >59 mL/min/1.73   GFR calc Af Amer 42 (L) >59 mL/min/1.73   BUN/Creatinine Ratio 17 12 - 28   Sodium 133 (L) 134 - 144 mmol/L   Potassium 4.3 3.5 - 5.2 mmol/L   Chloride 94 (L) 96 - 106 mmol/L   CO2 26 18 - 29 mmol/L   Calcium 9.4 8.7 - 10.3 mg/dL   Total Protein 7.4 6.0 - 8.5 g/dL   Albumin 3.9 3.5 - 4.7 g/dL   Globulin, Total 3.5 1.5 - 4.5 g/dL   Albumin/Globulin Ratio 1.1 (L) 1.2 - 2.2   Bilirubin Total 0.4 0.0 - 1.2 mg/dL   Alkaline Phosphatase 87 39 - 117 IU/L   AST 18 0 - 40 IU/L   ALT 11 0 - 32 IU/L  Hemoglobin A1c  Result Value Ref Range   Hgb A1c MFr Bld 6.5 (H) 4.8 - 5.6 %   Est. average glucose Bld gHb Est-mCnc 140 mg/dL  Iron and TIBC  Result Value Ref Range   Total Iron Binding Capacity 257 250 - 450 ug/dL   UIBC 207 118 - 369 ug/dL   Iron 50 27 - 139 ug/dL   Iron Saturation 19 15 - 55 %  Specimen status report  Result Value Ref Range   specimen status report Comment    Depression screen [facility] 2/9 04/23/2017 02/12/2017  Decreased Interest 0 0  Down, Depressed, Hopeless  0 0  PHQ - 2 Score 0 0       Assessment & Plan:   1. Posterior left knee pain   2. Adjustment disorder with depressed mood   3. Renal insufficiency   4. 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  Rx for Prednisone, Voltaren gel provided due to renal insufficiency.  Also, rx for Tramadol provided for acute pain. -if no improvement in one month, call for referral to ortho. -emotionally improving with Remeron; also sleeping well   Orders Placed This Encounter  Procedures  . DG Knee Complete 4 Views Left    Standing Status:   Future    Number of Occurrences:   1    Standing Expiration Date:   04/23/2018    Order Specific Question:   Reason for Exam (SYMPTOM  OR DIAGNOSIS REQUIRED)    Answer:   L knee stiffness, posterior knee pain    Order Specific Question:   Preferred imaging location?    Answer:   External   Meds ordered this encounter  Medications  . diclofenac sodium (VOLTAREN) 1 % GEL    Sig: Apply 4 g topically 4 (four) times daily.    Dispense:  100 g    Refill:  1  . predniSONE (DELTASONE) 20 MG tablet    Sig: Two tablets daily x 5 days    Dispense:  10 tablet    Refill:  0  . traMADol (ULTRAM) 50 MG tablet    Sig: Take 1 tablet (50 mg total) by mouth every 8 (eight) hours as needed.    Dispense:  30 tablet    Refill:  0    No Follow-up on file.   Kazia Grisanti Elayne Guerin, M.D. Primary [facility] at [facility] [place] [address] [place], [place]  [address] [phone] phone [phone] fax

## 2017-04-23 NOTE — Telephone Encounter (Signed)
Tramadol 50mg  [facility] into [facility] on [address] and [address] 1 tablet by mouth q8hrs PRN, dispense 30 with no refills.

## 2017-05-21 ENCOUNTER — Ambulatory Visit: Payer: Medicare HMO | Admitting: Family Medicine

## 2017-05-23 ENCOUNTER — Other Ambulatory Visit: Payer: Self-pay | Admitting: Family Medicine

## 2017-05-23 NOTE — Telephone Encounter (Signed)
Pt states that there was a mix up on the medication that the lisinopril only was filled from [facility] not with the HCTZ and they need that one sent over and since it is a mail order can we also call in a small amount until the mail order could be completed -pt daughter states she is already swelling iin the ankles   [phone]

## 2017-05-24 ENCOUNTER — Telehealth: Payer: Self-pay

## 2017-05-24 NOTE — Telephone Encounter (Signed)
My Chart message to pt explaining med is combination med.

## 2017-05-24 NOTE — Telephone Encounter (Signed)
Pt's phone call states Lisinopril filled, not HCTZ.   Medication is a combination of both  90 day supply sent 5/7.  My chart message explaining above

## 2017-05-27 ENCOUNTER — Other Ambulatory Visit: Payer: Self-pay | Admitting: Family Medicine

## 2017-05-27 ENCOUNTER — Telehealth: Payer: Self-pay | Admitting: Family Medicine

## 2017-05-27 DIAGNOSIS — H26492 Other secondary cataract, left eye: Secondary | ICD-10-CM

## 2017-05-27 NOTE — Telephone Encounter (Signed)
Pt calling again for someone to call her about what to take for pain

## 2017-05-27 NOTE — Telephone Encounter (Signed)
Pt wanted to know what she can take for pain, pt is having some pain in her knee..  Please advise: [phone]

## 2017-05-28 ENCOUNTER — Encounter: Payer: Self-pay | Admitting: Family Medicine

## 2017-05-28 ENCOUNTER — Ambulatory Visit (INDEPENDENT_AMBULATORY_CARE_PROVIDER_SITE_OTHER): Payer: Medicare HMO | Admitting: Family Medicine

## 2017-05-28 VITALS — BP 152/71 | HR 63 | Temp 98.0°F | Resp 16 | Ht 61.0 in | Wt 153.8 lb

## 2017-05-28 DIAGNOSIS — Z9181 History of falling: Secondary | ICD-10-CM

## 2017-05-28 DIAGNOSIS — E1122 Type 2 diabetes mellitus with diabetic chronic kidney disease: Secondary | ICD-10-CM

## 2017-05-28 DIAGNOSIS — M170 Bilateral primary osteoarthritis of knee: Secondary | ICD-10-CM | POA: Diagnosis not present

## 2017-05-28 DIAGNOSIS — N182 Chronic kidney disease, stage 2 (mild): Secondary | ICD-10-CM | POA: Diagnosis not present

## 2017-05-28 MED ORDER — RAISED TOILET SEAT MISC: 0 refills | Status: DC

## 2017-05-28 MED ORDER — WALKER MISC: 0 refills | Status: DC

## 2017-05-28 NOTE — Patient Instructions (Addendum)
   IF you received an x-ray today, you will receive an invoice from [facility]. Please contact [facility] at [phone] with questions or concerns regarding your invoice.   IF you received labwork today, you will receive an invoice from [facility]. Please contact [facility] at [phone] with questions or concerns regarding your invoice.   Our billing staff will not be able to assist you with questions regarding bills from these companies.  You will be contacted with the lab results as soon as they are available. The fastest way to get your results is to activate your My Chart account. Instructions are located on the last page of this paperwork. If you have not heard from us regarding the results in 2 weeks, please contact this office.     Fall Prevention in the Home Falls can cause injuries. They can happen to people of all ages. There are many things you can do to make your home safe and to help prevent falls. What can I do on the outside of my home?  Regularly fix the edges of walkways and driveways and fix any cracks.  Remove anything that might make you trip as you walk through a door, such as a raised step or threshold.  Trim any bushes or trees on the path to your home.  Use bright outdoor lighting.  Clear any walking paths of anything that might make someone trip, such as rocks or tools.  Regularly check to see if handrails are loose or broken. Make sure that both sides of any steps have handrails.  Any raised decks and porches should have guardrails on the edges.  Have any leaves, snow, or ice cleared regularly.  Use sand or salt on walking paths during winter.  Clean up any spills in your garage right away. This includes oil or grease spills. What can I do in the bathroom?  Use night lights.  Install grab bars by the toilet and in the tub and shower. Do not use towel bars as grab bars.  Use non-skid mats or decals in the tub or shower.  If  you need to sit down in the shower, use a plastic, non-slip stool.  Keep the floor dry. Clean up any water that spills on the floor as soon as it happens.  Remove soap buildup in the tub or shower regularly.  Attach bath mats securely with double-sided non-slip rug tape.  Do not have throw rugs and other things on the floor that can make you trip. What can I do in the bedroom?  Use night lights.  Make sure that you have a light by your bed that is easy to reach.  Do not use any sheets or blankets that are too big for your bed. They should not hang down onto the floor.  Have a firm chair that has side arms. You can use this for support while you get dressed.  Do not have throw rugs and other things on the floor that can make you trip. What can I do in the kitchen?  Clean up any spills right away.  Avoid walking on wet floors.  Keep items that you use a lot in easy-to-reach places.  If you need to reach something above you, use a strong step stool that has a grab bar.  Keep electrical cords out of the way.  Do not use floor polish or wax that makes floors slippery. If you must use wax, use non-skid floor wax.  Do not have throw   rugs and other things on the floor that can make you trip. What can I do with my stairs?  Do not leave any items on the stairs.  Make sure that there are handrails on both sides of the stairs and use them. Fix handrails that are broken or loose. Make sure that handrails are as long as the stairways.  Check any carpeting to make sure that it is firmly attached to the stairs. Fix any carpet that is loose or worn.  Avoid having throw rugs at the top or bottom of the stairs. If you do have throw rugs, attach them to the floor with carpet tape.  Make sure that you have a light switch at the top of the stairs and the bottom of the stairs. If you do not have them, ask someone to add them for you. What else can I do to help prevent falls?  Wear shoes  that: ? Do not have high heels. ? Have rubber bottoms. ? Are comfortable and fit you well. ? Are closed at the toe. Do not wear sandals.  If you use a stepladder: ? Make sure that it is fully opened. Do not climb a closed stepladder. ? Make sure that both sides of the stepladder are locked into place. ? Ask someone to hold it for you, if possible.  Clearly mark and make sure that you can see: ? Any grab bars or handrails. ? First and last steps. ? Where the edge of each step is.  Use tools that help you move around (mobility aids) if they are needed. These include: ? Canes. ? Walkers. ? Scooters. ? Crutches.  Turn on the lights when you go into a dark area. Replace any light bulbs as soon as they burn out.  Set up your furniture so you have a clear path. Avoid moving your furniture around.  If any of your floors are uneven, fix them.  If there are any pets around you, be aware of where they are.  Review your medicines with your doctor. Some medicines can make you feel dizzy. This can increase your chance of falling. Ask your doctor what other things that you can do to help prevent falls. This information is not intended to replace advice given to you by your health care provider. Make sure you discuss any questions you have with your health care provider. Document Released: 09/08/2009 Document Revised: 04/19/2016 Document Reviewed: 12/17/2014 Elsevier Interactive Patient Education  2018 Elsevier Inc.  

## 2017-05-28 NOTE — Telephone Encounter (Signed)
Pt is already on Ultram and Voltaren. Please advise

## 2017-05-28 NOTE — Progress Notes (Signed)
Chief Complaint  Patient presents with  . Leg Swelling    both legs, onset:05/25/17, achy pain in the knee cap.  Pain causing pt to walk had to call grandson to come help her up out of the chair.  Per granddaughter she sleeps upright and she thinks she has poor circulation.   Pt has hx of knee surgery.  Needs help sitting and standing.    HPI  Patient reports that since May 24, 2017 when she was seen for left knee pain she has continued to have left knee pain.  She reports that over the years her right knee has been find since the knee replacement.  She reports that she now has swelling in the right knee.  She reports that the right knee is worse now than the left.  She reports that her ankles are also swollen.  Her pain is described as an nagging achy pain She reports that it waxes and wanes She was taking prednisone as instructed and tramadol sparingly.  She reports that the Voltaren gel helps as well.  She reports that she had an orthopedic surgeon in the past but it has been years since she has been seen.  She reports that she takes tylenol mostly and ibuprofen for her knee pain She states that she takes about 4 tablets a month Lab Results  Component Value Date   CREATININE 1.36 (H) 02/12/2017    Past Medical History:  Diagnosis Date  . Arthritis   . Cataract   . Depression   . Diabetes mellitus without complication (HCC)    Diet and excercise  . Diabetic retinopathy ([place])   . High cholesterol   . Hypertension     Current Outpatient Prescriptions  Medication Sig Dispense Refill  . aspirin 81 MG tablet Take 81 mg by mouth daily.     . carvedilol (COREG) 12.5 MG tablet Take 12.5 mg by mouth 2 (two) times daily with a meal.    . Cholecalciferol (VITAMIN D PO) Take 5,000 Units by mouth daily. OTC, strength not known     . diclofenac sodium (VOLTAREN) 1 % GEL Apply 4 g topically 4 (four) times daily. 100 g 1  . lisinopril-hydrochlorothiazide (PRINZIDE,ZESTORETIC) 20-12.5 MG  tablet Take 1 tablet by mouth daily. 90 tablet 0  . mirtazapine (REMERON) 7.5 MG tablet Take 1 tablet (7.5 mg total) by mouth at bedtime. 30 tablet 2  . Multiple Vitamin (MULTIVITAMIN WITH MINERALS) TABS tablet Take 1 tablet by mouth daily.    . pravastatin (PRAVACHOL) 10 MG tablet Take 10 mg by mouth daily.    . traMADol (ULTRAM) 50 MG tablet Take 1 tablet (50 mg total) by mouth every 8 (eight) hours as needed. 30 tablet 0  . vitamin C (ASCORBIC ACID) 500 MG tablet Take 500 mg by mouth daily.    . DUREZOL 0.05 % EMUL Inject 1 drop into the eye daily.    . Misc. Devices (RAISED TOILET SEAT) MISC For use to prevent falls 1 each 0  . Misc. Devices (WALKER) MISC Use walker to prevent falls 1 each 0   No current facility-administered medications for this visit.     Allergies:  Allergies  Allergen Reactions  . Codeine Nausea And Vomiting    Past Surgical History:  Procedure Laterality Date  . EYE SURGERY    . TUBAL LIGATION      Social History   Social History  . Marital status: Widowed    Spouse name: N/A  . Number  of children: 3  . Years of education: N/A   Occupational History  . retired    Social History Main Topics  . Smoking status: Never Smoker  . Smokeless tobacco: Former Systems developer  . Alcohol use No  . Drug use: No  . Sexual activity: Not Currently   Other Topics Concern  . None   Social History Narrative   Marital status: widowed; not dating in 2018      Children: 3 children; 9 grandchildren; 8 gg.      Lives: with daughter      Employment: retired.       Tobacco: none      Alcohol: none      Exercise: previous walking; no walking during winter months.      ADLs: no driving.  Cleans, washes clothes; cleans house; pays bills.  Grocery shopping with daughter      Advanced Directives:  None;  FULL CODE; daughter HCPOA.          ROS Review of Systems See HPI Constitution: No fevers or chills No malaise No diaphoresis Skin: No rash or itching Eyes: no  blurry vision, no double vision GU: no dysuria or hematuria Neuro: no dizziness or headaches  Objective: Vitals:   05/28/17 1341  BP: (!) 152/71  Pulse: 63  Resp: 16  Temp: 98 F (36.7 C)  TempSrc: Oral  SpO2: 99%  Weight: 153 lb 12.8 oz (69.8 kg)  Height: 5\' 1"  (1.549 m)    Physical Exam  Constitutional: She is oriented to person, place, and time. She appears well-developed and well-nourished.  HENT:  Head: Normocephalic and atraumatic.  Cardiovascular: Normal rate, regular rhythm and normal heart sounds.   Pulmonary/Chest: Effort normal and breath sounds normal. No respiratory distress. She has no wheezes.  Neurological: She is alert and oriented to person, place, and time.     Right knee with trace effusion Left knee with chronic OA changes like enlarged joint and crepitus Trace pedal edema nontender  Assessment and Plan Jazariah was seen today for leg swelling.  Diagnoses and all orders for this visit:  Primary osteoarthritis of both knees-  Discussed that her knee replacement have been worn out and that since she was doing well for so long she should have orthopedics evaluate -     Ambulatory referral to Orthopedic Surgery  At high risk for falls- gave handicap parking  CKD stage 2 due to type 2 diabetes mellitus ([place])- discussed that she should not take nsaids  Discussed that it would be best to get reevaluated by Orthopedics  Other orders -     Misc. Devices (RAISED TOILET SEAT) MISC; For use to prevent falls -     Misc. Devices [facility]) MISC; Use walker to prevent falls   A total of 25 minutes were spent face-to-face with the patient during this encounter and over half of that time was spent on counseling and coordination of care. Including reviewing renal function and discussing contraindications to medications Also discussed ways to prevent falls Prescribed a raised toilet seat and a walker as well as handicap parking   [place]

## 2017-06-03 ENCOUNTER — Other Ambulatory Visit: Payer: Self-pay | Admitting: Family Medicine

## 2017-06-03 NOTE — Telephone Encounter (Signed)
Evaluated by Dr. Nolon Rod on 05/28/17; will defer to [facility]

## 2017-06-06 MED ORDER — TRAMADOL HCL 50 MG PO TABS: 50.0000 mg | ORAL_TABLET | Freq: Three times a day (TID) | ORAL | 0 refills | Status: DC | PRN

## 2017-06-06 MED ORDER — DICLOFENAC SODIUM 1 % TD GEL: 4.0000 g | Freq: Four times a day (QID) | TRANSDERMAL | 1 refills | Status: DC

## 2017-06-06 NOTE — Telephone Encounter (Signed)
Please phone in tramadol refill If it cannot be phone in then please ask a provider in the office to print and sign it for me so that the patient can get something for pain She uses them sparingly but cannot use NSAIDs due to her renal function  Lab Results  Component Value Date   CREATININE 1.36 (H) 02/12/2017

## 2017-06-07 ENCOUNTER — Telehealth: Payer: Self-pay

## 2017-06-07 MED ORDER — DICLOFENAC SODIUM 1 % TD GEL: 4.0000 g | Freq: Four times a day (QID) | TRANSDERMAL | 1 refills | Status: DC

## 2017-06-07 NOTE — Telephone Encounter (Signed)
[facility] [address], per patient request. Rx for Tramadol 50mg  [facility].

## 2017-06-07 NOTE — Telephone Encounter (Signed)
[facility] [address] Tramadol 50mg .

## 2017-06-13 ENCOUNTER — Ambulatory Visit (INDEPENDENT_AMBULATORY_CARE_PROVIDER_SITE_OTHER): Payer: Medicare HMO | Admitting: Ophthalmology

## 2017-06-13 DIAGNOSIS — E113593 Type 2 diabetes mellitus with proliferative diabetic retinopathy without macular edema, bilateral: Secondary | ICD-10-CM | POA: Diagnosis not present

## 2017-06-13 DIAGNOSIS — H43813 Vitreous degeneration, bilateral: Secondary | ICD-10-CM | POA: Diagnosis not present

## 2017-06-13 DIAGNOSIS — E11319 Type 2 diabetes mellitus with unspecified diabetic retinopathy without macular edema: Secondary | ICD-10-CM | POA: Diagnosis not present

## 2017-06-13 DIAGNOSIS — H35033 Hypertensive retinopathy, bilateral: Secondary | ICD-10-CM | POA: Diagnosis not present

## 2017-06-13 DIAGNOSIS — I10 Essential (primary) hypertension: Secondary | ICD-10-CM

## 2017-06-25 DIAGNOSIS — H26492 Other secondary cataract, left eye: Secondary | ICD-10-CM | POA: Diagnosis not present

## 2017-06-25 DIAGNOSIS — R6889 Other general symptoms and signs: Secondary | ICD-10-CM | POA: Diagnosis not present

## 2017-06-25 DIAGNOSIS — Z961 Presence of intraocular lens: Secondary | ICD-10-CM | POA: Diagnosis not present

## 2017-06-25 DIAGNOSIS — E113593 Type 2 diabetes mellitus with proliferative diabetic retinopathy without macular edema, bilateral: Secondary | ICD-10-CM | POA: Diagnosis not present

## 2017-07-01 ENCOUNTER — Ambulatory Visit (INDEPENDENT_AMBULATORY_CARE_PROVIDER_SITE_OTHER): Payer: Medicare HMO

## 2017-07-01 ENCOUNTER — Ambulatory Visit (INDEPENDENT_AMBULATORY_CARE_PROVIDER_SITE_OTHER): Payer: Medicare HMO | Admitting: Orthopaedic Surgery

## 2017-07-01 ENCOUNTER — Encounter (INDEPENDENT_AMBULATORY_CARE_PROVIDER_SITE_OTHER): Payer: Self-pay | Admitting: Orthopaedic Surgery

## 2017-07-01 VITALS — BP 122/64 | HR 68 | Resp 16 | Ht 61.0 in | Wt 153.0 lb

## 2017-07-01 DIAGNOSIS — G8929 Other chronic pain: Secondary | ICD-10-CM

## 2017-07-01 DIAGNOSIS — M25561 Pain in right knee: Secondary | ICD-10-CM

## 2017-07-01 DIAGNOSIS — R6889 Other general symptoms and signs: Secondary | ICD-10-CM | POA: Diagnosis not present

## 2017-07-01 NOTE — Progress Notes (Signed)
Office Visit Note   Patient: Leah Stafford           Date of Birth: [birth year]           MRN: [id number] Visit Date: 07/01/2017              Requested by: [name], MD [place], [place] [address] PCP: Wardell Honour, MD   Assessment & Plan: Visit Diagnoses:  1. 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 Presently Mrs. Osborne is doing well. No specific treatment at this time. Discussed injections, Visco supplementation as future treatment options  Follow-Up Instructions: Return if symptoms worsen or fail to improve.   Orders:  Orders Placed This Encounter  Procedures  . XR KNEE 3 VIEW RIGHT   No orders of the defined types were placed in this encounter.     Procedures: No procedures performed   Clinical Data: No additional findings.   Subjective: Chief Complaint  Patient presents with  . Left Knee - Pain  Mrs. Balducci relates that she's had trouble off and on with both of her knees for a "long period time. She had some type of procedure to her right knee in the early 1970s and has done relatively well until just recently without injury or trauma she's had some recurrent pain. She also was had some discomfort in her left knee. When she made the appointment she was really having a difficult time bearing weight and even sleeping but over period several days at seemed to resolve. Presently she is doing very well. He denies any history of injury or trauma or denies fever or chills. She's had some swelling of both of her ankles and she has history of hypertension and is on lisinopril. No significant back pain or groin discomfort. Prior films performed of left knee were reviewed on the PACS system. There are significant degenerative changes associated with calcification of the menisci. There appears to be significant  degenerative arthritis in the left knee with CPPD.  HPI  Review of Systems   Objective: Vital Signs: BP 122/64   Pulse 68   Resp 16   Ht 5\' 1"  (1.549 m)   Wt 153 lb (69.4 kg)   BMI 28.91 kg/m   Physical Exam  Ortho Exam awake alert and oriented 3. Comfortable sitting. Minimal effusion of both knees. No significant medial lateral joint pain on either knee. Well-healed medial incision about the right knee.  lacks a few degrees to full extension bilaterally with positive patellar crepitation. No significant instability of either knee. Mild nonpitting edema both ankles. Normal sensibility. +1 pulses. Flexion about 95-100 bilaterally. No popliteal pain. Walks without ambulatory aid. Straight leg raise negative bilaterally painless range of motion of both hips  Specialty Comments:  No specialty comments available.  Imaging: Xr Knee 3 View Right  Result Date: 07/01/2017 3 views of the right knee replacement in the standing projection. There is evidence of ectopic calcification along the menisci consistent with calcium pyrophosphate disease. Bones are diffusely calcified consistent with at least osteopenia. Decreased medial lateral joint spaces consistent with osteoarthritis socially with peripheral osteophytes in all 3 compartments    PMFS History: Patient Active Problem List   Diagnosis Date Noted  . Pure hypercholesterolemia 02/12/2017  . Vitreous hemorrhage of right eye ([place]) 02/07/2016  . Left posterior capsular opacification 08/02/2015  . Pseudophakia of  both eyes 08/02/2015  . Renal insufficiency 09/16/2013  . 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 Hypertension 03/30/2013   Past Medical History:  Diagnosis Date  . Arthritis   . Cataract   . Depression   . Diabetes mellitus without complication (HCC)    Diet and excercise  . Diabetic retinopathy ([place])    . High cholesterol   . Hypertension     Family History  Problem Relation Age of Onset  . Cancer Father   . Hyperlipidemia Sister   . Hypertension Sister   . Hypertension Brother   . Hyperlipidemia Brother   . Heart disease Brother     Past Surgical History:  Procedure Laterality Date  . EYE SURGERY    . TUBAL LIGATION     Social History   Occupational History  . retired    Social History Main Topics  . Smoking status: Never Smoker  . Smokeless tobacco: Former Systems developer  . Alcohol use No  . Drug use: No  . Sexual activity: Not Currently     Garald Balding, MD   Note - This record has been created using [employer].  Chart creation errors have been sought, but may not always  have been located. Such creation errors do not reflect on  the standard of medical care.

## 2017-07-02 ENCOUNTER — Telehealth (INDEPENDENT_AMBULATORY_CARE_PROVIDER_SITE_OTHER): Payer: Self-pay | Admitting: Orthopaedic Surgery

## 2017-07-02 NOTE — Telephone Encounter (Signed)
Patient was seen yesterday and forgot to ask about a recommendation for a foot doctor to trim her toenails. Please advise.

## 2017-07-10 DIAGNOSIS — N813 Complete uterovaginal prolapse: Secondary | ICD-10-CM | POA: Diagnosis not present

## 2017-07-17 ENCOUNTER — Telehealth: Payer: Self-pay | Admitting: Family Medicine

## 2017-07-17 NOTE — Telephone Encounter (Signed)
Pt is needing a refill on her aravastatin and have it sent to the [facility] mail order  She states that a request should have already been requested   Best number [phone]

## 2017-07-18 ENCOUNTER — Other Ambulatory Visit: Payer: Self-pay | Admitting: Emergency Medicine

## 2017-07-18 MED ORDER — PRAVASTATIN SODIUM 10 MG PO TABS: 10.0000 mg | ORAL_TABLET | Freq: Every day | ORAL | 3 refills | Status: DC

## 2017-07-18 NOTE — Telephone Encounter (Signed)
Medication clarification: Spoke with patient and she is requesting Pravachol refill. Refill ordered and sent to [facility]

## 2017-07-19 ENCOUNTER — Encounter: Payer: Self-pay | Admitting: Family Medicine

## 2017-07-19 ENCOUNTER — Telehealth: Payer: Self-pay | Admitting: Family Medicine

## 2017-07-19 ENCOUNTER — Ambulatory Visit (INDEPENDENT_AMBULATORY_CARE_PROVIDER_SITE_OTHER): Payer: Medicare HMO | Admitting: Family Medicine

## 2017-07-19 ENCOUNTER — Ambulatory Visit (INDEPENDENT_AMBULATORY_CARE_PROVIDER_SITE_OTHER): Payer: Medicare HMO

## 2017-07-19 VITALS — BP 156/64 | HR 54 | Temp 98.9°F | Resp 16 | Ht 61.0 in | Wt 157.8 lb

## 2017-07-19 DIAGNOSIS — M25562 Pain in left knee: Secondary | ICD-10-CM | POA: Diagnosis not present

## 2017-07-19 DIAGNOSIS — G8929 Other chronic pain: Secondary | ICD-10-CM | POA: Diagnosis not present

## 2017-07-19 DIAGNOSIS — M19012 Primary osteoarthritis, left shoulder: Secondary | ICD-10-CM

## 2017-07-19 DIAGNOSIS — Z794 Long term (current) use of insulin: Secondary | ICD-10-CM | POA: Diagnosis not present

## 2017-07-19 DIAGNOSIS — R29898 Other symptoms and signs involving the musculoskeletal system: Secondary | ICD-10-CM | POA: Diagnosis not present

## 2017-07-19 DIAGNOSIS — M25512 Pain in left shoulder: Secondary | ICD-10-CM | POA: Diagnosis not present

## 2017-07-19 DIAGNOSIS — R2681 Unsteadiness on feet: Secondary | ICD-10-CM

## 2017-07-19 DIAGNOSIS — M1712 Unilateral primary osteoarthritis, left knee: Secondary | ICD-10-CM

## 2017-07-19 DIAGNOSIS — E113593 Type 2 diabetes mellitus with proliferative diabetic retinopathy without macular edema, bilateral: Secondary | ICD-10-CM | POA: Diagnosis not present

## 2017-07-19 NOTE — Telephone Encounter (Signed)
Patient requested paperwork given to provider on 07/19/2017 be faxed to [facility] at the number [phone].

## 2017-07-19 NOTE — Patient Instructions (Signed)
     IF you received an x-ray today, you will receive an invoice from [facility]. Please contact [facility] at [phone] with questions or concerns regarding your invoice.   IF you received labwork today, you will receive an invoice from [facility]. Please contact [facility] at [phone] with questions or concerns regarding your invoice.   Our billing staff will not be able to assist you with questions regarding bills from these companies.  You will be contacted with the lab results as soon as they are available. The fastest way to get your results is to activate your My Chart account. Instructions are located on the last page of this paperwork. If you have not heard from us regarding the results in 2 weeks, please contact this office.     

## 2017-07-19 NOTE — Progress Notes (Signed)
Subjective:    Patient ID: Leah Stafford, female    DOB: August 19, 1934, 83 y.o.   MRN: [id number]  07/19/2017  form to fill out (per pt for physical help around the house)   HPI This 83 y.o. female presents for evaluation of ongoing LEFT shoulder and LEFT knee pain.  S/p consultation by orthopedics/[facility] on 07/01/17.  Has been struggling with care around the house.  May warrant additional injections in knees.  Also suffered with leg swelling with acute pain in LEFT knee.  Suffering with decreased ROM of LEFT shoulder.    LEFT shoulder pain: onset of LEFT shoulder pain; having LEFT shoulder issues; unable to lift shoulder up independently.  Evaluated in 01/2017.  Diagnosed several years ago with bursitis.  Intermittent issue.  No recent xray and no recent physical therapy.  Doing exercises.    Wants form completed; cannot lift with LEFT arm.  RIGHT handed dominant; can use both hands.  Does not trust self during the day to cook independently.  Balancing self well without walker.  Suffering with excessive stiffness.  No driving.  Silver sneakers; trying to get back together.  Going to silver sneakers three days per week; usually goes two days per week.    BP Readings from Last 3 Encounters:  07/19/17 (!) 156/64  07/01/17 122/64  05/28/17 (!) 152/71   Wt Readings from Last 3 Encounters:  07/19/17 157 lb 12.8 oz (71.6 kg)  07/01/17 153 lb (69.4 kg)  05/28/17 153 lb 12.8 oz (69.8 kg)   Immunization History  Administered Date(s) Administered  . Pneumococcal Conjugate-13 11/17/2014  . Pneumococcal Polysaccharide-23 09/18/2013    Review of Systems  Constitutional: Negative for chills, diaphoresis, fatigue and fever.  Eyes: Negative for visual disturbance.  Respiratory: Negative for cough and shortness of breath.   Cardiovascular: Negative for chest pain, palpitations and leg swelling.  Gastrointestinal: Negative for abdominal pain, constipation, diarrhea, nausea and vomiting.    Endocrine: Negative for cold intolerance, heat intolerance, polydipsia, polyphagia and polyuria.  Musculoskeletal: Positive for arthralgias, gait problem and joint swelling. Negative for back pain, neck pain and neck stiffness.  Neurological: Negative for dizziness, tremors, seizures, syncope, facial asymmetry, speech difficulty, weakness, light-headedness, numbness and headaches.    Past Medical History:  Diagnosis Date  . Arthritis   . Cataract   . Depression   . Diabetes mellitus without complication (HCC)    Diet and excercise  . Diabetic retinopathy ([place])   . High cholesterol   . Hypertension    Past Surgical History:  Procedure Laterality Date  . EYE SURGERY    . TUBAL LIGATION     Allergies  Allergen Reactions  . Codeine Nausea And Vomiting    Social History   Social History  . Marital status: Widowed    Spouse name: N/A  . Number of children: 3  . Years of education: N/A   Occupational History  . retired    Social History Main Topics  . Smoking status: Never Smoker  . Smokeless tobacco: Former Systems developer  . Alcohol use No  . Drug use: No  . Sexual activity: Not Currently   Other Topics Concern  . Not on file   Social History Narrative   Marital status: widowed; not dating in 2018      Children: 3 children; 9 grandchildren; 8 gg.      Lives: alone; daughter five minutes per day.      Employment: retired.       Tobacco: none  Alcohol: none      Exercise: previous walking; no walking during winter months.      ADLs: no driving.  Cleans, washes clothes; cleans house; pays bills.  Grocery shopping with daughter.        Advanced Directives:  None;  FULL CODE; daughter HCPOA.         Family History  Problem Relation Age of Onset  . Cancer Father   . Hyperlipidemia Sister   . Hypertension Sister   . Hypertension Brother   . Hyperlipidemia Brother   . Heart disease Brother        Objective:    BP (!) 156/64   Pulse (!) 54   Temp 98.9 F (37.2  C) (Oral)   Resp 16   Ht 5\' 1"  (1.549 m)   Wt 157 lb 12.8 oz (71.6 kg)   SpO2 99%   BMI 29.82 kg/m  Physical Exam  Constitutional: She is oriented to person, place, and time. She appears well-developed and well-nourished. No distress.  HENT:  Head: Normocephalic and atraumatic.  Right Ear: External ear normal.  Left Ear: External ear normal.  Nose: Nose normal.  Mouth/Throat: Oropharynx is clear and moist.  Eyes: Pupils are equal, round, and reactive to light. Conjunctivae and EOM are normal.  Neck: Normal range of motion. Neck supple. Carotid bruit is not present. No thyromegaly present.  Cardiovascular: Normal rate, regular rhythm, normal heart sounds and intact distal pulses.  Exam reveals no gallop and no friction rub.   No murmur heard. Pulmonary/Chest: Effort normal and breath sounds normal. She has no wheezes. She has no rales.  Abdominal: Soft. Bowel sounds are normal. She exhibits no distension and no mass. There is no tenderness. There is no rebound and no guarding.  Musculoskeletal:       Left shoulder: She exhibits decreased range of motion, pain and decreased strength. She exhibits no tenderness, no bony tenderness, no swelling, no spasm and normal pulse.       Left knee: She exhibits decreased range of motion and swelling. Tenderness found. Medial joint line tenderness noted. No lateral joint line, no MCL, no LCL and no patellar tendon tenderness noted.  Lymphadenopathy:    She has no cervical adenopathy.  Neurological: She is alert and oriented to person, place, and time. No cranial nerve deficit.  Skin: Skin is warm and dry. No rash noted. She is not diaphoretic. No erythema. No pallor.  Psychiatric: She has a normal mood and affect. Her behavior is normal.    No results found. Depression screen [facility] 2/9 05/28/2017 04/23/2017 02/12/2017  Decreased Interest 0 0 0  Down, Depressed, Hopeless 0 0 0  PHQ - 2 Score 0 0 0   Fall Risk  05/28/2017 04/23/2017 02/12/2017  Falls in  the past year? No No No   Dg Shoulder Left  Result Date: 07/19/2017 CLINICAL DATA:  Left shoulder pain for 3 years, worsening. No known injury. EXAM: LEFT SHOULDER - 2+ VIEW COMPARISON:  None. FINDINGS: There is no acute bony or joint abnormality. Subtle calcifications in the rotator cuff tendons at the greater tuberosity consistent with tendinopathy noted. Moderate acromioclavicular osteoarthritis is seen. IMPRESSION: No acute abnormality. Mild appearing calcific rotator cuff tendinopathy. Moderate acromioclavicular osteoarthritis. Electronically Signed   By: Inge Rise M.D.   On: 07/19/2017 16:37   Xr Knee 3 View Right  Result Date: 07/01/2017 3 views of the right knee replacement in the standing projection. There is evidence of ectopic calcification along the  menisci consistent with calcium pyrophosphate disease. Bones are diffusely calcified consistent with at least osteopenia. Decreased medial lateral joint spaces consistent with osteoarthritis socially with peripheral osteophytes in all 3 compartments       Assessment & Plan:   1. 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 Acute pain of left shoulder   3. Chronic pain of left knee   4. Muscular deconditioning   5. Unsteady gait    -persistent LEFT knee and shoulder pain with recent imaging of both knee and shoulder confirming osteoarthritis; pain is interfering with ADLs at home.  Patient does not drive due to retinopathy; refer for [facility] referral for nursing evaluation and physical therapy.  Patient now living alone. Due to osteoarthritis, has suffered with deconditioning and thus unsteady gait.   -obtain labs for DMII and chronic disease management.   -patient presents with forms to complete for in-home assistance; will refer to [facility] to determine appropriate services needed.  Orders Placed This Encounter  Procedures  . DG  Shoulder Left    Standing Status:   Future    Number of Occurrences:   1    Standing Expiration Date:   07/19/2018    Order Specific Question:   Reason for Exam (SYMPTOM  OR DIAGNOSIS REQUIRED)    Answer:   L shoulder pain for three years with recent worsening    Order Specific Question:   Preferred imaging location?    Answer:   External  . CBC with Differential/Platelet  . Comprehensive metabolic panel  . Hemoglobin A1c  . Microalbumin, urine  . Ambulatory referral to Home Health    Referral Priority:   Routine    Referral Type:   Home Health Care    Referral Reason:   Specialty Services Required    Requested Specialty:   [place]    Number of Visits Requested:   1   No orders of the defined types were placed in this encounter.   Return in about 3 months (around 10/19/2017) for recheck deconditioning, L shoulder pain.   Shaliyah Taite Elayne Guerin, M.D. Primary Care at [facility] previously [facility] [address] [place], [place]  [address] [phone] phone [phone] fax

## 2017-07-20 LAB — COMPREHENSIVE METABOLIC PANEL
A/G RATIO: 1.1 — AB (ref 1.2–2.2)
ALT: 12 IU/L (ref 0–32)
AST: 19 IU/L (ref 0–40)
Albumin: 4.1 g/dL (ref 3.5–4.7)
Alkaline Phosphatase: 109 IU/L (ref 39–117)
BILIRUBIN TOTAL: 0.4 mg/dL (ref 0.0–1.2)
BUN / CREAT RATIO: 13 (ref 12–28)
BUN: 15 mg/dL (ref 8–27)
CALCIUM: 10 mg/dL (ref 8.7–10.3)
CHLORIDE: 93 mmol/L — AB (ref 96–106)
CO2: 25 mmol/L (ref 20–29)
Creatinine, Ser: 1.2 mg/dL — ABNORMAL HIGH (ref 0.57–1.00)
GFR calc non Af Amer: 42 mL/min/{1.73_m2} — ABNORMAL LOW (ref >59)
GFR, EST AFRICAN AMERICAN: 49 mL/min/{1.73_m2} — AB (ref >59)
Globulin, Total: 3.8 g/dL (ref 1.5–4.5)
Glucose: 123 mg/dL — ABNORMAL HIGH (ref 65–99)
POTASSIUM: 4.1 mmol/L (ref 3.5–5.2)
SODIUM: 134 mmol/L (ref 134–144)
TOTAL PROTEIN: 7.9 g/dL (ref 6.0–8.5)

## 2017-07-20 LAB — CBC WITH DIFFERENTIAL/PLATELET
BASOS: 0 %
Basophils Absolute: 0 10*3/uL (ref 0.0–0.2)
EOS (ABSOLUTE): 0.2 10*3/uL (ref 0.0–0.4)
EOS: 5 %
Hematocrit: 31.5 % — ABNORMAL LOW (ref 34.0–46.6)
Hemoglobin: 10.7 g/dL — ABNORMAL LOW (ref 11.1–15.9)
IMMATURE GRANS (ABS): 0 10*3/uL (ref 0.0–0.1)
IMMATURE GRANULOCYTES: 0 %
Lymphocytes Absolute: 1.3 10*3/uL (ref 0.7–3.1)
Lymphs: 28 %
MCH: 30.8 pg (ref 26.6–33.0)
MCHC: 34 g/dL (ref 31.5–35.7)
MCV: 91 fL (ref 79–97)
MONOS ABS: 0.4 10*3/uL (ref 0.1–0.9)
Monocytes: 8 %
NEUTROS PCT: 59 %
Neutrophils Absolute: 2.7 10*3/uL (ref 1.4–7.0)
PLATELETS: 322 10*3/uL (ref 150–379)
RBC: 3.47 x10E6/uL — AB (ref 3.77–5.28)
RDW: 14.4 % (ref 12.3–15.4)
WBC: 4.7 10*3/uL (ref 3.4–10.8)

## 2017-07-20 LAB — HEMOGLOBIN A1C
Est. average glucose Bld gHb Est-mCnc: 146 mg/dL
Hgb A1c MFr Bld: 6.7 % — ABNORMAL HIGH (ref 4.8–5.6)

## 2017-07-20 LAB — MICROALBUMIN, URINE: MICROALBUM., U, RANDOM: 49.8 ug/mL

## 2017-07-23 ENCOUNTER — Encounter: Payer: Self-pay | Admitting: Family Medicine

## 2017-07-23 ENCOUNTER — Telehealth: Payer: Self-pay | Admitting: Family Medicine

## 2017-07-23 NOTE — Telephone Encounter (Signed)
PATIENT CALLED TO LET DR. Tamala Julian KNOW THAT HER [employer] MAIL-IN TOLD HER IT WOULD BE 7 - 10 DAYS BEFORE HER CARVEDILOL (COREG) 12.5 MG AND PRAVASTATIN (PRAVACHOL) 10 MG WOULD BE MAILED TO HER. SHE WILL BE GOING OUT OF TOWN ON THURS. (07/25/17) AND SHE SAID SHE IS AFRAID TO GO WITHOUT HER MEDICINE. SHE WOULD LIKE TO HAVE ABOUT 10 PILLS OF EACH CALLED INTO HER LOCAL PHARMACY. (I DID EXPLAIN OUR REFILL POLICY TO HER). BEST PHONE [phone] (CELL) [facility] [address] [facility] [address] [place]

## 2017-07-24 ENCOUNTER — Telehealth: Payer: Self-pay | Admitting: Family Medicine

## 2017-07-24 NOTE — Telephone Encounter (Signed)
Pt is completely out of her diabetes meds and is going out of town tomorrow so she is hoping to get this called in today   Best number [phone]

## 2017-07-25 ENCOUNTER — Telehealth: Payer: Self-pay | Admitting: Family Medicine

## 2017-07-25 MED ORDER — CARVEDILOL 12.5 MG PO TABS: 12.5000 mg | ORAL_TABLET | Freq: Two times a day (BID) | ORAL | 0 refills | Status: DC

## 2017-07-25 NOTE — Telephone Encounter (Signed)
Leah Stafford from [facility] sent me a message via Epic to let me know the pt would like to start Home Health care after the holiday on 07/31/17. Thanks!

## 2017-07-25 NOTE — Telephone Encounter (Signed)
Spoke with patient, requesting refill for Coreg [facility] [place]. Per chart, patient seen on 07/19/17, not due for follow up until 09/2017. 30 days of medication sent to pharmacy until patient receives mail order Rx./ S.Zamara Cozad,CMA

## 2017-07-27 NOTE — Telephone Encounter (Signed)
Call patient --- as well discussed at her visit, I recommend [facility] referral to undergo an assessment with physical therapy, nursing, and occupational assessment to be performed.  Advised patient at visit that I would NOT fill out forms as this is not normal approach to treating shoulder pain and knee pain.  Yet, I did agree to place referral for advanced home care to confirm she is able to care for self at home.

## 2017-07-29 DIAGNOSIS — M19012 Primary osteoarthritis, left shoulder: Secondary | ICD-10-CM | POA: Insufficient documentation

## 2017-07-29 DIAGNOSIS — M1712 Unilateral primary osteoarthritis, left knee: Secondary | ICD-10-CM | POA: Insufficient documentation

## 2017-07-31 DIAGNOSIS — E113593 Type 2 diabetes mellitus with proliferative diabetic retinopathy without macular edema, bilateral: Secondary | ICD-10-CM | POA: Diagnosis not present

## 2017-07-31 DIAGNOSIS — M25512 Pain in left shoulder: Secondary | ICD-10-CM | POA: Diagnosis not present

## 2017-07-31 DIAGNOSIS — G8929 Other chronic pain: Secondary | ICD-10-CM | POA: Diagnosis not present

## 2017-07-31 DIAGNOSIS — I10 Essential (primary) hypertension: Secondary | ICD-10-CM | POA: Diagnosis not present

## 2017-07-31 DIAGNOSIS — M24512 Contracture, left shoulder: Secondary | ICD-10-CM | POA: Diagnosis not present

## 2017-07-31 DIAGNOSIS — M1712 Unilateral primary osteoarthritis, left knee: Secondary | ICD-10-CM | POA: Diagnosis not present

## 2017-07-31 NOTE — Telephone Encounter (Signed)
Pt advised. Stated that she had [facility] come today and she is now improving.

## 2017-08-01 ENCOUNTER — Telehealth: Payer: Self-pay | Admitting: Family Medicine

## 2017-08-01 NOTE — Telephone Encounter (Signed)
Sree called from home health physical therapy stated that he needs a verbal order from Dr. Tamala Julian for pt to have physical therpay.  Please advise Sree: [phone]

## 2017-08-02 ENCOUNTER — Telehealth: Payer: Self-pay | Admitting: Family Medicine

## 2017-08-02 DIAGNOSIS — M1712 Unilateral primary osteoarthritis, left knee: Secondary | ICD-10-CM

## 2017-08-02 DIAGNOSIS — M24512 Contracture, left shoulder: Secondary | ICD-10-CM | POA: Diagnosis not present

## 2017-08-02 DIAGNOSIS — I10 Essential (primary) hypertension: Secondary | ICD-10-CM | POA: Diagnosis not present

## 2017-08-02 DIAGNOSIS — G8929 Other chronic pain: Secondary | ICD-10-CM | POA: Diagnosis not present

## 2017-08-02 DIAGNOSIS — E113593 Type 2 diabetes mellitus with proliferative diabetic retinopathy without macular edema, bilateral: Secondary | ICD-10-CM | POA: Diagnosis not present

## 2017-08-02 DIAGNOSIS — M25512 Pain in left shoulder: Secondary | ICD-10-CM | POA: Diagnosis not present

## 2017-08-02 NOTE — Telephone Encounter (Signed)
Shree from [facility] stated that pt needs a prescription for a 3n1 commode sent to [facility]..  Please adv: [phone]

## 2017-08-05 ENCOUNTER — Other Ambulatory Visit: Payer: Self-pay | Admitting: Family Medicine

## 2017-08-05 DIAGNOSIS — M1712 Unilateral primary osteoarthritis, left knee: Secondary | ICD-10-CM | POA: Diagnosis not present

## 2017-08-05 DIAGNOSIS — G8929 Other chronic pain: Secondary | ICD-10-CM | POA: Diagnosis not present

## 2017-08-05 DIAGNOSIS — M25512 Pain in left shoulder: Secondary | ICD-10-CM | POA: Diagnosis not present

## 2017-08-05 DIAGNOSIS — M24512 Contracture, left shoulder: Secondary | ICD-10-CM | POA: Diagnosis not present

## 2017-08-05 DIAGNOSIS — I10 Essential (primary) hypertension: Secondary | ICD-10-CM | POA: Diagnosis not present

## 2017-08-05 DIAGNOSIS — E113593 Type 2 diabetes mellitus with proliferative diabetic retinopathy without macular edema, bilateral: Secondary | ICD-10-CM | POA: Diagnosis not present

## 2017-08-05 NOTE — Telephone Encounter (Signed)
Left message on Sree's voicemail; provided verbal OK to start physical therapy.

## 2017-08-05 NOTE — Telephone Encounter (Signed)
Please fax order to [facility] per Sree's requests.

## 2017-08-05 NOTE — Telephone Encounter (Signed)
Please advise 

## 2017-08-08 DIAGNOSIS — I10 Essential (primary) hypertension: Secondary | ICD-10-CM | POA: Diagnosis not present

## 2017-08-08 DIAGNOSIS — E113593 Type 2 diabetes mellitus with proliferative diabetic retinopathy without macular edema, bilateral: Secondary | ICD-10-CM | POA: Diagnosis not present

## 2017-08-08 DIAGNOSIS — M24512 Contracture, left shoulder: Secondary | ICD-10-CM | POA: Diagnosis not present

## 2017-08-08 DIAGNOSIS — M25512 Pain in left shoulder: Secondary | ICD-10-CM | POA: Diagnosis not present

## 2017-08-08 DIAGNOSIS — G8929 Other chronic pain: Secondary | ICD-10-CM | POA: Diagnosis not present

## 2017-08-08 DIAGNOSIS — M1712 Unilateral primary osteoarthritis, left knee: Secondary | ICD-10-CM | POA: Diagnosis not present

## 2017-08-12 DIAGNOSIS — M1712 Unilateral primary osteoarthritis, left knee: Secondary | ICD-10-CM | POA: Diagnosis not present

## 2017-08-12 DIAGNOSIS — G8929 Other chronic pain: Secondary | ICD-10-CM | POA: Diagnosis not present

## 2017-08-12 DIAGNOSIS — M25512 Pain in left shoulder: Secondary | ICD-10-CM | POA: Diagnosis not present

## 2017-08-12 DIAGNOSIS — E113593 Type 2 diabetes mellitus with proliferative diabetic retinopathy without macular edema, bilateral: Secondary | ICD-10-CM | POA: Diagnosis not present

## 2017-08-12 DIAGNOSIS — M24512 Contracture, left shoulder: Secondary | ICD-10-CM | POA: Diagnosis not present

## 2017-08-12 DIAGNOSIS — I10 Essential (primary) hypertension: Secondary | ICD-10-CM | POA: Diagnosis not present

## 2017-08-13 MED ORDER — COMMODE BEDSIDE MISC: 0 refills | Status: DC

## 2017-08-13 NOTE — Telephone Encounter (Signed)
Referral placed to [facility] for DME bedside commode 3 in 1.

## 2017-08-13 NOTE — Telephone Encounter (Signed)
Please call to provide VERBAL order for bedside commode as requested.

## 2017-08-15 DIAGNOSIS — G8929 Other chronic pain: Secondary | ICD-10-CM | POA: Diagnosis not present

## 2017-08-15 DIAGNOSIS — I10 Essential (primary) hypertension: Secondary | ICD-10-CM | POA: Diagnosis not present

## 2017-08-15 DIAGNOSIS — M25512 Pain in left shoulder: Secondary | ICD-10-CM | POA: Diagnosis not present

## 2017-08-15 DIAGNOSIS — E113593 Type 2 diabetes mellitus with proliferative diabetic retinopathy without macular edema, bilateral: Secondary | ICD-10-CM | POA: Diagnosis not present

## 2017-08-15 DIAGNOSIS — M1712 Unilateral primary osteoarthritis, left knee: Secondary | ICD-10-CM | POA: Diagnosis not present

## 2017-08-15 DIAGNOSIS — M24512 Contracture, left shoulder: Secondary | ICD-10-CM | POA: Diagnosis not present

## 2017-08-21 ENCOUNTER — Other Ambulatory Visit: Payer: Self-pay | Admitting: Family Medicine

## 2017-08-21 ENCOUNTER — Telehealth: Payer: Self-pay | Admitting: Family Medicine

## 2017-08-21 DIAGNOSIS — E113593 Type 2 diabetes mellitus with proliferative diabetic retinopathy without macular edema, bilateral: Secondary | ICD-10-CM | POA: Diagnosis not present

## 2017-08-21 DIAGNOSIS — I10 Essential (primary) hypertension: Secondary | ICD-10-CM | POA: Diagnosis not present

## 2017-08-21 DIAGNOSIS — M24512 Contracture, left shoulder: Secondary | ICD-10-CM | POA: Diagnosis not present

## 2017-08-21 DIAGNOSIS — M1712 Unilateral primary osteoarthritis, left knee: Secondary | ICD-10-CM | POA: Diagnosis not present

## 2017-08-21 DIAGNOSIS — G8929 Other chronic pain: Secondary | ICD-10-CM | POA: Diagnosis not present

## 2017-08-21 DIAGNOSIS — M25512 Pain in left shoulder: Secondary | ICD-10-CM | POA: Diagnosis not present

## 2017-08-21 NOTE — Telephone Encounter (Signed)
[employer] calling for a 90 day refill on Carvedilol but she also need a short term supply [facility] the [facility]

## 2017-08-22 DIAGNOSIS — M1712 Unilateral primary osteoarthritis, left knee: Secondary | ICD-10-CM | POA: Diagnosis not present

## 2017-08-22 DIAGNOSIS — E113593 Type 2 diabetes mellitus with proliferative diabetic retinopathy without macular edema, bilateral: Secondary | ICD-10-CM | POA: Diagnosis not present

## 2017-08-22 DIAGNOSIS — G8929 Other chronic pain: Secondary | ICD-10-CM | POA: Diagnosis not present

## 2017-08-22 DIAGNOSIS — M25512 Pain in left shoulder: Secondary | ICD-10-CM | POA: Diagnosis not present

## 2017-08-22 DIAGNOSIS — M24512 Contracture, left shoulder: Secondary | ICD-10-CM | POA: Diagnosis not present

## 2017-08-22 DIAGNOSIS — I10 Essential (primary) hypertension: Secondary | ICD-10-CM | POA: Diagnosis not present

## 2017-08-23 ENCOUNTER — Telehealth: Payer: Self-pay | Admitting: Family Medicine

## 2017-08-23 ENCOUNTER — Telehealth: Payer: Self-pay | Admitting: *Deleted

## 2017-08-23 NOTE — Telephone Encounter (Signed)
Routed to Dr. Smith  

## 2017-08-23 NOTE — Telephone Encounter (Signed)
Pt called the ofc requesting raised toilet seat which was ordered on 05/28/2017,she didn't receive it. I spoke with Vivien Rota, at [facility] and she stated that pt will have to get device locally, it can be ordered through [facility], Ref #[id number]. Pt stated that she doesn't need a walker. Pls give an order to fax. thanks

## 2017-08-23 NOTE — Telephone Encounter (Signed)
1.Pt states she called 08/21/17 (no record of encounter) asking for refill on carvedilol (COREG) 12.5 MG tablet // she calling back today to check the status of req.// pt is on last dose today// she is also asking that 90 supply req. Is sent to [facility] mail order- signed by Dr. Tamala Julian instead of Philis Nettle?? ( as she was infomed that Philis Nettle has stated she is no longer under her care)  [facility] [phone] [facility] to [place]  2. [employer] is also asking for consult on toilet seat that was ordered, but need order clarifications    [phone] pt is asking for call

## 2017-08-23 NOTE — Telephone Encounter (Signed)
Spoke with pt Carvedilol 12.5 mg 1 tab po twice a day with meal #60,0 refill.

## 2017-08-27 DIAGNOSIS — M1712 Unilateral primary osteoarthritis, left knee: Secondary | ICD-10-CM | POA: Diagnosis not present

## 2017-08-27 DIAGNOSIS — M19012 Primary osteoarthritis, left shoulder: Secondary | ICD-10-CM | POA: Diagnosis not present

## 2017-08-27 DIAGNOSIS — M25562 Pain in left knee: Secondary | ICD-10-CM | POA: Diagnosis not present

## 2017-08-27 DIAGNOSIS — R2681 Unsteadiness on feet: Secondary | ICD-10-CM | POA: Diagnosis not present

## 2017-08-27 DIAGNOSIS — E113593 Type 2 diabetes mellitus with proliferative diabetic retinopathy without macular edema, bilateral: Secondary | ICD-10-CM | POA: Diagnosis not present

## 2017-08-27 DIAGNOSIS — M25512 Pain in left shoulder: Secondary | ICD-10-CM | POA: Diagnosis not present

## 2017-08-27 DIAGNOSIS — M24512 Contracture, left shoulder: Secondary | ICD-10-CM | POA: Diagnosis not present

## 2017-08-27 DIAGNOSIS — R29898 Other symptoms and signs involving the musculoskeletal system: Secondary | ICD-10-CM | POA: Diagnosis not present

## 2017-08-27 DIAGNOSIS — G8929 Other chronic pain: Secondary | ICD-10-CM | POA: Diagnosis not present

## 2017-08-27 DIAGNOSIS — I10 Essential (primary) hypertension: Secondary | ICD-10-CM | POA: Diagnosis not present

## 2017-08-27 NOTE — Telephone Encounter (Signed)
Please call [employer] with order for bedside commode raised toilet seat.  Give verbal order; also please fax over order in [place].

## 2017-08-28 ENCOUNTER — Ambulatory Visit: Payer: Medicare HMO | Admitting: Podiatry

## 2017-08-28 NOTE — Telephone Encounter (Signed)
[employer] called again for an update on the med refill request. Talked to Jenny Reichmann and per Jenny Reichmann pt needs an OV for refills on her Carvediol.  Adv [facility] and they stated ok.

## 2017-08-30 NOTE — Telephone Encounter (Signed)
Tried to call [employer] about toilet sit but they informed me that they do not carry toilet seats. So will try to figure out where I need to send the order for the toilet seat.

## 2017-09-02 ENCOUNTER — Ambulatory Visit: Payer: Medicare HMO | Admitting: Podiatry

## 2017-09-03 DIAGNOSIS — M25512 Pain in left shoulder: Secondary | ICD-10-CM | POA: Diagnosis not present

## 2017-09-03 DIAGNOSIS — M24512 Contracture, left shoulder: Secondary | ICD-10-CM | POA: Diagnosis not present

## 2017-09-03 DIAGNOSIS — G8929 Other chronic pain: Secondary | ICD-10-CM | POA: Diagnosis not present

## 2017-09-03 DIAGNOSIS — E113593 Type 2 diabetes mellitus with proliferative diabetic retinopathy without macular edema, bilateral: Secondary | ICD-10-CM | POA: Diagnosis not present

## 2017-09-03 DIAGNOSIS — I10 Essential (primary) hypertension: Secondary | ICD-10-CM | POA: Diagnosis not present

## 2017-09-03 DIAGNOSIS — M1712 Unilateral primary osteoarthritis, left knee: Secondary | ICD-10-CM | POA: Diagnosis not present

## 2017-09-06 ENCOUNTER — Encounter: Payer: Self-pay | Admitting: Podiatry

## 2017-09-06 ENCOUNTER — Ambulatory Visit (INDEPENDENT_AMBULATORY_CARE_PROVIDER_SITE_OTHER): Payer: Medicare HMO | Admitting: Podiatry

## 2017-09-06 ENCOUNTER — Other Ambulatory Visit: Payer: Self-pay

## 2017-09-06 VITALS — BP 149/70 | HR 56

## 2017-09-06 DIAGNOSIS — B351 Tinea unguium: Secondary | ICD-10-CM

## 2017-09-06 DIAGNOSIS — M79674 Pain in right toe(s): Secondary | ICD-10-CM | POA: Diagnosis not present

## 2017-09-06 DIAGNOSIS — M79675 Pain in left toe(s): Secondary | ICD-10-CM | POA: Diagnosis not present

## 2017-09-06 DIAGNOSIS — R6889 Other general symptoms and signs: Secondary | ICD-10-CM | POA: Diagnosis not present

## 2017-09-06 NOTE — Progress Notes (Signed)
   Subjective:    Patient ID: Leah Stafford, female    DOB: 07/21/1934, 83 y.o.   MRN: [id number]  HPI  Chief Complaint  Patient presents with  . Nail Problem    Debride/"borderline diabetic"    [name] the office they for concerns of thick, painful, elongated toenails that she cannot trim herself. She denies any redness or drainage coming from the toenail sites they do get painful mostly with pressure in shoes. She said no recent treatment for this. She states that she is borderline diabetic last A1c was 6.4.  PCP- Dr. Reginia Forts, MD Last seen 07/19/2017  Review of Systems     Objective:   Physical Exam General: AAO x3, NAD  Dermatological: Nails are hypertrophic, dystrophic, brittle, discolored, elongated 10. No surrounding redness or drainage. Tenderness nails 1-5 bilaterally. No open lesions or pre-ulcerative lesions are identified today.  Vascular: Dorsalis Pedis artery and Posterior Tibial artery pedal pulses are 2/4 bilateral with immedate capillary fill time. There is no pain with calf compression, swelling, warmth, erythema. Legs appear to be of equal size. So signs of DVT or infection.   Neruologic: Grossly intact via light touch bilateral.  Protective threshold with Semmes Wienstein monofilament intact to all pedal sites bilateral.   Musculoskeletal: HAV bilaterally. No pain, crepitus, or limitation noted with foot and ankle range of motion bilateral. Muscular strength 5/5 in all groups tested bilateral.     Assessment & Plan:  83 year old female with symptomatic onychomycosis -Treatment options discussed including all alternatives, risks, and complications.  -Etiology of symptoms were discussed -Discussed treatment options for nail fungus. She wishes to hold off on any medications and continue with regular debridements at this point.  -Nails debrided 10 without complications or bleeding. -Daily foot inspection -Follow-up in 3 months or sooner if any  problems arise. In the meantime, encouraged to call the office with any questions, concerns, change in symptoms.   Celesta Gentile, DPM

## 2017-09-10 DIAGNOSIS — G8929 Other chronic pain: Secondary | ICD-10-CM | POA: Diagnosis not present

## 2017-09-10 DIAGNOSIS — I10 Essential (primary) hypertension: Secondary | ICD-10-CM | POA: Diagnosis not present

## 2017-09-10 DIAGNOSIS — E113593 Type 2 diabetes mellitus with proliferative diabetic retinopathy without macular edema, bilateral: Secondary | ICD-10-CM | POA: Diagnosis not present

## 2017-09-10 DIAGNOSIS — M25512 Pain in left shoulder: Secondary | ICD-10-CM | POA: Diagnosis not present

## 2017-09-10 DIAGNOSIS — M24512 Contracture, left shoulder: Secondary | ICD-10-CM | POA: Diagnosis not present

## 2017-09-10 DIAGNOSIS — M1712 Unilateral primary osteoarthritis, left knee: Secondary | ICD-10-CM | POA: Diagnosis not present

## 2017-09-23 ENCOUNTER — Ambulatory Visit (INDEPENDENT_AMBULATORY_CARE_PROVIDER_SITE_OTHER): Payer: Medicare HMO | Admitting: Family Medicine

## 2017-09-23 ENCOUNTER — Encounter: Payer: Self-pay | Admitting: Family Medicine

## 2017-09-23 VITALS — BP 122/68 | HR 68 | Temp 98.5°F | Resp 16 | Ht 61.81 in | Wt 156.0 lb

## 2017-09-23 DIAGNOSIS — Z0000 Encounter for general adult medical examination without abnormal findings: Secondary | ICD-10-CM | POA: Diagnosis not present

## 2017-09-23 DIAGNOSIS — I10 Essential (primary) hypertension: Secondary | ICD-10-CM

## 2017-09-23 DIAGNOSIS — M1712 Unilateral primary osteoarthritis, left knee: Secondary | ICD-10-CM | POA: Diagnosis not present

## 2017-09-23 DIAGNOSIS — E2839 Other primary ovarian failure: Secondary | ICD-10-CM

## 2017-09-23 DIAGNOSIS — E7800 Pure hypercholesterolemia, unspecified: Secondary | ICD-10-CM

## 2017-09-23 DIAGNOSIS — F329 Major depressive disorder, single episode, unspecified: Secondary | ICD-10-CM

## 2017-09-23 DIAGNOSIS — N289 Disorder of kidney and ureter, unspecified: Secondary | ICD-10-CM | POA: Diagnosis not present

## 2017-09-23 DIAGNOSIS — E1121 Type 2 diabetes mellitus with diabetic nephropathy: Secondary | ICD-10-CM | POA: Diagnosis not present

## 2017-09-23 DIAGNOSIS — M19012 Primary osteoarthritis, left shoulder: Secondary | ICD-10-CM | POA: Diagnosis not present

## 2017-09-23 LAB — POCT URINALYSIS DIP (MANUAL ENTRY)
BILIRUBIN UA: NEGATIVE mg/dL
Bilirubin, UA: NEGATIVE
Blood, UA: NEGATIVE
Glucose, UA: NEGATIVE mg/dL
NITRITE UA: NEGATIVE
PH UA: 6 (ref 5.0–8.0)
PROTEIN UA: NEGATIVE mg/dL
Spec Grav, UA: <=1.005 — AB (ref 1.010–1.025)
UROBILINOGEN UA: 0.2 U/dL

## 2017-09-23 MED ORDER — ZOSTER VAC RECOMB ADJUVANTED 50 MCG/0.5ML IM SUSR: 0.5000 mL | Freq: Once | INTRAMUSCULAR | 1 refills | Status: AC

## 2017-09-23 MED ORDER — MIRTAZAPINE 7.5 MG PO TABS: 7.5000 mg | ORAL_TABLET | Freq: Every day | ORAL | 1 refills | Status: DC

## 2017-09-23 MED ORDER — LISINOPRIL-HYDROCHLOROTHIAZIDE 20-12.5 MG PO TABS: 1.0000 | ORAL_TABLET | Freq: Every day | ORAL | 1 refills | Status: DC

## 2017-09-23 MED ORDER — CARVEDILOL 12.5 MG PO TABS: 12.5000 mg | ORAL_TABLET | Freq: Two times a day (BID) | ORAL | 1 refills | Status: DC

## 2017-09-23 NOTE — Progress Notes (Addendum)
Subjective:    Patient ID: Leah Stafford, female    DOB: 02-09-1934, 83 y.o.   MRN: [id number]  09/23/2017  Annual Exam (follow-up); Hypertension; Diabetes; Hyperlipidemia; and Osteoarthritis    HPI This 83 y.o. female presents for [place] and three month evaluation of DMII, hypertension,hypercholesterolemia, shoulder pain and knee pain B chronic. Management changes made at last visit include the following:  -persistent LEFT knee and shoulder pain with recent imaging of both knee and shoulder confirming osteoarthritis; pain is interfering with ADLs at home.  Patient does not drive due to retinopathy; refer for [facility] referral for nursing evaluation and physical therapy.  Patient now living alone. Due to osteoarthritis, has suffered with deconditioning and thus unsteady gait.   -obtain labs for DMII and chronic disease management.   -patient presents with forms to complete for in-home assistance; will refer to [facility] to determine appropriate services needed.  Loved physical therapy; graduated two weeks ago.  Went two times per week and then one time per week.  May will need physical therapy during winter months to avoid deconditioning during winter months. Loved his services.  Enjoyed working with each other.  Not having pain; might have pain in legs and knees twice where must take TYlenol.   Using Voltarent twice per months.  Shoulders are good; no pain.  Full range of motion.   Stopped taking ASA 81mg  due to being cold.  Drinking water and coffee.    Last physical: not sure Pap smear: n/a Mammogram:  N/a due to age Colonoscopy:  N/a due to age Bone density:  Not sure Eye exam:  Every 3-6 months.   BP Readings from Last 3 Encounters:  09/23/17 122/68  09/06/17 (!) 149/70  07/19/17 (!) 156/64   Wt Readings from Last 3 Encounters:  09/23/17 156 lb (70.8 kg)  07/19/17 157 lb 12.8 oz (71.6 kg)  07/01/17 153 lb (69.4 kg)   Immunization History  Administered  Date(s) Administered  . Pneumococcal Conjugate-13 11/17/2014  . Pneumococcal Polysaccharide-23 09/18/2013    Review of Systems  Constitutional: Negative for activity change, appetite change, chills, diaphoresis, fatigue, fever and unexpected weight change.  HENT: Negative for congestion, dental problem, drooling, ear discharge, ear pain, facial swelling, hearing loss, mouth sores, nosebleeds, postnasal drip, rhinorrhea, sinus pressure, sneezing, sore throat, tinnitus, trouble swallowing and voice change.   Eyes: Negative for photophobia, pain, discharge, redness, itching and visual disturbance.  Respiratory: Negative for apnea, cough, choking, chest tightness, shortness of breath, wheezing and stridor.   Cardiovascular: Negative for chest pain, palpitations and leg swelling.  Gastrointestinal: Negative for abdominal distention, abdominal pain, anal bleeding, blood in stool, constipation, diarrhea, nausea, rectal pain and vomiting.  Endocrine: Negative for cold intolerance, heat intolerance, polydipsia, polyphagia and polyuria.  Genitourinary: Negative for decreased urine volume, difficulty urinating, dyspareunia, dysuria, enuresis, flank pain, frequency, genital sores, hematuria, menstrual problem, pelvic pain, urgency, vaginal bleeding, vaginal discharge and vaginal pain.  Musculoskeletal: Negative for arthralgias, back pain, gait problem, joint swelling, myalgias, neck pain and neck stiffness.  Skin: Negative for color change, pallor, rash and wound.  Allergic/Immunologic: Negative for environmental allergies, food allergies and immunocompromised state.  Neurological: Negative for dizziness, tremors, seizures, syncope, facial asymmetry, speech difficulty, weakness, light-headedness, numbness and headaches.  Hematological: Negative for adenopathy. Does not bruise/bleed easily.  Psychiatric/Behavioral: Negative for agitation, behavioral problems, confusion, decreased concentration, dysphoric mood,  hallucinations, self-injury, sleep disturbance and suicidal ideas. The patient is not nervous/anxious and is not hyperactive.  Past Medical History:  Diagnosis Date  . Arthritis   . Cataract   . Depression   . Diabetes mellitus without complication (HCC)    Diet and excercise  . Diabetic retinopathy ([place])   . High cholesterol   . Hypertension    Past Surgical History:  Procedure Laterality Date  . EYE SURGERY    . TUBAL LIGATION     Allergies  Allergen Reactions  . Codeine Nausea And Vomiting   Current Outpatient Prescriptions on File Prior to Visit  Medication Sig Dispense Refill  . aspirin 81 MG tablet Take 81 mg by mouth daily.     . Cholecalciferol (VITAMIN D PO) Take 5,000 Units by mouth daily. OTC, strength not known     . diclofenac sodium (VOLTAREN) 1 % GEL Apply 4 g topically 4 (four) times daily. 100 g 1  . DUREZOL 0.05 % EMUL Inject 1 drop into the eye daily.    . Misc. Devices (COMMODE BEDSIDE) MISC One bedside commode 3 in 1  Dx: osteoarthritis knee 1 each 0  . Misc. Devices (RAISED TOILET SEAT) MISC For use to prevent falls 1 each 0  . Misc. Devices (WALKER) MISC Use walker to prevent falls 1 each 0  . Multiple Vitamin (MULTIVITAMIN WITH MINERALS) TABS tablet Take 1 tablet by mouth daily.    . pravastatin (PRAVACHOL) 10 MG tablet Take 1 tablet (10 mg total) by mouth daily. Please send overnight 90 tablet 3  . traMADol (ULTRAM) 50 MG tablet Take 1 tablet (50 mg total) by mouth every 8 (eight) hours as needed. 30 tablet 0  . vitamin C (ASCORBIC ACID) 500 MG tablet Take 500 mg by mouth daily.     No current facility-administered medications on file prior to visit.    Social History   Social History  . Marital status: Widowed    Spouse name: N/A  . Number of children: 3  . Years of education: N/A   Occupational History  . retired    Social History Main Topics  . Smoking status: Never Smoker  . Smokeless tobacco: Former Systems developer  . Alcohol use No  . Drug  use: No  . Sexual activity: Not Currently   Other Topics Concern  . Not on file   Social History Narrative   Marital status: widowed; not dating in 2018      Children: 3 children; 9 grandchildren; 8 gg.      Lives: alone; daughter five minutes per day.      Employment: retired.       Tobacco: none      Alcohol: none      Exercise: previous walking; no walking during winter months.      ADLs: no driving.  Cleans, washes clothes; cleans house; pays bills.  Grocery shopping with daughter.  No assistant devices in 2018.       Advanced Directives:  None;  FULL CODE; daughter HCPOA.         Family History  Problem Relation Age of Onset  . Cancer Father   . Hyperlipidemia Sister   . Hypertension Sister   . Hypertension Brother   . Hyperlipidemia Brother   . Heart disease Brother        Objective:    BP 122/68   Pulse 68   Temp 98.5 F (36.9 C) (Oral)   Resp 16   Ht 5' 1.81" (1.57 m)   Wt 156 lb (70.8 kg)   SpO2 97%   BMI 28.71  kg/m  Physical Exam  Constitutional: She is oriented to person, place, and time. She appears well-developed and well-nourished. No distress.  HENT:  Head: Normocephalic and atraumatic.  Right Ear: External ear normal.  Left Ear: External ear normal.  Nose: Nose normal.  Mouth/Throat: Oropharynx is clear and moist.  Eyes: Pupils are equal, round, and reactive to light. Conjunctivae and EOM are normal.  Neck: Normal range of motion and full passive range of motion without pain. Neck supple. No JVD present. Carotid bruit is not present. No thyromegaly present.  Cardiovascular: Normal rate, regular rhythm and normal heart sounds.  Exam reveals no gallop and no friction rub.   No murmur heard. Pulmonary/Chest: Effort normal and breath sounds normal. She has no wheezes. She has no rales.  Abdominal: Soft. Bowel sounds are normal. She exhibits no distension and no mass. There is no tenderness. There is no rebound and no guarding.  Musculoskeletal:        Right shoulder: Normal.       Left shoulder: Normal.       Cervical back: Normal.  Lymphadenopathy:    She has no cervical adenopathy.  Neurological: She is alert and oriented to person, place, and time. She has normal reflexes. No cranial nerve deficit. She exhibits normal muscle tone. Coordination normal.  Skin: Skin is warm and dry. No rash noted. She is not diaphoretic. No erythema. No pallor.  Psychiatric: She has a normal mood and affect. Her behavior is normal. Judgment and thought content normal.  Nursing note and vitals reviewed.  No results found. Depression screen [facility] 2/9 09/23/2017 05/28/2017 04/23/2017 02/12/2017  Decreased Interest 0 0 0 0  Down, Depressed, Hopeless 0 0 0 0  PHQ - 2 Score 0 0 0 0   Fall Risk  09/23/2017 05/28/2017 04/23/2017 02/12/2017  Falls in the past year? No No No No    Functional Status Survey: Is the patient deaf or have difficulty hearing?: No Does the patient have difficulty seeing, even when wearing glasses/contacts?: Yes Does the patient have difficulty concentrating, remembering, or making decisions?: No Does the patient have difficulty walking or climbing stairs?: No Does the patient have difficulty dressing or bathing?: No Does the patient have difficulty doing errands alone such as visiting a doctor's office or shopping?: Yes     Assessment & Plan:   1. Encounter for Medicare annual wellness exam   2. Type 2 diabetes mellitus with diabetic nephropathy, without long-term current use of insulin (HCC)   3. Pure hypercholesterolemia   4. Primary osteoarthritis of left knee   5. Primary osteoarthritis of left shoulder   6. Renal insufficiency   7. Reactive depression   8. Essential hypertension   9. Estrogen deficiency    -moderate fall risk; undergoing treatment for depression; no evidence of hearing loss.  Discussed advanced directives and living will; also discussed end of life issues including code status.  -anticipatory guidance  provided --- exercise, weight loss, safe driving practices, aspirin 81mg  daily. -obtain age appropriate screening labs and labs for chronic disease management. -doing much better s/p physical therapy for osteoarthritis knees and shoulders; had become deconditioned due to osteoarthritis related pain; doing much better and much more independent. -hypertension, hypercholesterolemia, and DMII controlled; obtain labs for chronic medical management. Refills provided.   Orders Placed This Encounter  Procedures  . DG Bone Density    Standing Status:   Future    Standing Expiration Date:   11/25/2018    Order Specific Question:  Reason for Exam (SYMPTOM  OR DIAGNOSIS REQUIRED)    Answer:   estrogen deficiency    Order Specific Question:   Preferred imaging location?    Answer:   [facility]  . Lipid panel  . Microalbumin / creatinine urine ratio  . TSH  . CBC with Differential/Platelet  . Comprehensive metabolic panel  . POCT urinalysis dipstick   Meds ordered this encounter  Medications  . Zoster Vaccine Adjuvanted [facility]) injection    Sig: Inject 0.5 mLs into the muscle once.    Dispense:  0.5 mL    Refill:  1  . carvedilol (COREG) 12.5 MG tablet    Sig: Take 1 tablet (12.5 mg total) by mouth 2 (two) times daily with a meal.    Dispense:  180 tablet    Refill:  1  . mirtazapine (REMERON) 7.5 MG tablet    Sig: Take 1 tablet (7.5 mg total) by mouth at bedtime.    Dispense:  90 tablet    Refill:  1  . lisinopril-hydrochlorothiazide (PRINZIDE,ZESTORETIC) 20-12.5 MG tablet    Sig: Take 1 tablet by mouth daily.    Dispense:  90 tablet    Refill:  1    Return in about 3 months (around 12/24/2017) for complete physical examiniation.   Jerrald Doverspike Elayne Guerin, M.D. Primary Care at [facility] [address] [place], [place]  [phone] phone [phone] fax

## 2017-09-23 NOTE — Patient Instructions (Addendum)
   IF you received an x-ray today, you will receive an invoice from [facility]. Please contact [facility] at [phone] with questions or concerns regarding your invoice.   IF you received labwork today, you will receive an invoice from [facility]. Please contact [facility] at [phone] with questions or concerns regarding your invoice.   Our billing staff will not be able to assist you with questions regarding bills from these companies.  You will be contacted with the lab results as soon as they are available. The fastest way to get your results is to activate your My Chart account. Instructions are located on the last page of this paperwork. If you have not heard from us regarding the results in 2 weeks, please contact this office.      Preventive Care 81 Years and Older, Female Preventive care refers to lifestyle choices and visits with your health care provider that can promote health and wellness. What does preventive care include?  A yearly physical exam. This is also called an annual well check.  Dental exams once or twice a year.  Routine eye exams. Ask your health care provider how often you should have your eyes checked.  Personal lifestyle choices, including: ? Daily care of your teeth and gums. ? Regular physical activity. ? Eating a healthy diet. ? Avoiding tobacco and drug use. ? Limiting alcohol use. ? Practicing safe sex. ? Taking low-dose aspirin every day. ? Taking vitamin and mineral supplements as recommended by your health care provider. What happens during an annual well check? The services and screenings done by your health care provider during your annual well check will depend on your age, overall health, lifestyle risk factors, and family history of disease. Counseling Your health care provider may ask you questions about your:  Alcohol use.  Tobacco use.  Drug use.  Emotional well-being.  Home and relationship  well-being.  Sexual activity.  Eating habits.  History of falls.  Memory and ability to understand (cognition).  Work and work environment.  Reproductive health.  Screening You may have the following tests or measurements:  Height, weight, and BMI.  Blood pressure.  Lipid and cholesterol levels. These may be checked every 5 years, or more frequently if you are over 50 years old.  Skin check.  Lung cancer screening. You may have this screening every year starting at age 55 if you have a 30-pack-year history of smoking and currently smoke or have quit within the past 15 years.  Fecal occult blood test (FOBT) of the stool. You may have this test every year starting at age 50.  Flexible sigmoidoscopy or colonoscopy. You may have a sigmoidoscopy every 5 years or a colonoscopy every 10 years starting at age 50.  Hepatitis C blood test.  Hepatitis B blood test.  Sexually transmitted disease (STD) testing.  Diabetes screening. This is done by checking your blood sugar (glucose) after you have not eaten for a while (fasting). You may have this done every 1-3 years.  Bone density scan. This is done to screen for osteoporosis. You may have this done starting at age 65.  Mammogram. This may be done every 1-2 years. Talk to your health care provider about how often you should have regular mammograms.  Talk with your health care provider about your test results, treatment options, and if necessary, the need for more tests. Vaccines Your health care provider may recommend certain vaccines, such as:  Influenza vaccine. This is recommended every year.    Tetanus, diphtheria, and acellular pertussis (Tdap, Td) vaccine. You may need a Td booster every 10 years.  Varicella vaccine. You may need this if you have not been vaccinated.  Zoster vaccine. You may need this after age 60.  Measles, mumps, and rubella (MMR) vaccine. You may need at least one dose of MMR if you were born in  1955 or later. You may also need a second dose.  Pneumococcal 13-valent conjugate (PCV13) vaccine. One dose is recommended after age 65.  Pneumococcal polysaccharide (PPSV23) vaccine. One dose is recommended after age 65.  Meningococcal vaccine. You may need this if you have certain conditions.  Hepatitis A vaccine. You may need this if you have certain conditions or if you travel or work in places where you may be exposed to hepatitis A.  Hepatitis B vaccine. You may need this if you have certain conditions or if you travel or work in places where you may be exposed to hepatitis B.  Haemophilus influenzae type b (Hib) vaccine. You may need this if you have certain conditions.  Talk to your health care provider about which screenings and vaccines you need and how often you need them. This information is not intended to replace advice given to you by your health care provider. Make sure you discuss any questions you have with your health care provider. Document Released: 12/09/2015 Document Revised: 08/01/2016 Document Reviewed: 09/13/2015 Elsevier Interactive Patient Education  2017 Elsevier Inc.  

## 2017-09-24 LAB — CBC WITH DIFFERENTIAL/PLATELET
Basophils Absolute: 0 10*3/uL (ref 0.0–0.2)
Basos: 1 %
EOS (ABSOLUTE): 0.2 10*3/uL (ref 0.0–0.4)
EOS: 5 %
HEMATOCRIT: 31 % — AB (ref 34.0–46.6)
HEMOGLOBIN: 10.5 g/dL — AB (ref 11.1–15.9)
IMMATURE GRANULOCYTES: 1 %
Immature Grans (Abs): 0 10*3/uL (ref 0.0–0.1)
LYMPHS: 34 %
Lymphocytes Absolute: 1.4 10*3/uL (ref 0.7–3.1)
MCH: 31.1 pg (ref 26.6–33.0)
MCHC: 33.9 g/dL (ref 31.5–35.7)
MCV: 92 fL (ref 79–97)
MONOCYTES: 10 %
Monocytes Absolute: 0.4 10*3/uL (ref 0.1–0.9)
NEUTROS PCT: 49 %
Neutrophils Absolute: 2 10*3/uL (ref 1.4–7.0)
Platelets: 278 10*3/uL (ref 150–379)
RBC: 3.38 x10E6/uL — AB (ref 3.77–5.28)
RDW: 13.6 % (ref 12.3–15.4)
WBC: 4.1 10*3/uL (ref 3.4–10.8)

## 2017-09-24 LAB — COMPREHENSIVE METABOLIC PANEL
A/G RATIO: 1.2 (ref 1.2–2.2)
ALBUMIN: 3.9 g/dL (ref 3.5–4.7)
ALT: 12 IU/L (ref 0–32)
AST: 22 IU/L (ref 0–40)
Alkaline Phosphatase: 90 IU/L (ref 39–117)
BILIRUBIN TOTAL: 0.5 mg/dL (ref 0.0–1.2)
BUN / CREAT RATIO: 11 — AB (ref 12–28)
BUN: 16 mg/dL (ref 8–27)
CHLORIDE: 94 mmol/L — AB (ref 96–106)
CO2: 21 mmol/L (ref 20–29)
Calcium: 9.7 mg/dL (ref 8.7–10.3)
Creatinine, Ser: 1.45 mg/dL — ABNORMAL HIGH (ref 0.57–1.00)
GFR calc non Af Amer: 34 mL/min/{1.73_m2} — ABNORMAL LOW (ref >59)
GFR, EST AFRICAN AMERICAN: 39 mL/min/{1.73_m2} — AB (ref >59)
GLOBULIN, TOTAL: 3.2 g/dL (ref 1.5–4.5)
Glucose: 163 mg/dL — ABNORMAL HIGH (ref 65–99)
POTASSIUM: 4.1 mmol/L (ref 3.5–5.2)
Sodium: 133 mmol/L — ABNORMAL LOW (ref 134–144)
TOTAL PROTEIN: 7.1 g/dL (ref 6.0–8.5)

## 2017-09-24 LAB — LIPID PANEL
CHOLESTEROL TOTAL: 159 mg/dL (ref 100–199)
Chol/HDL Ratio: 2.4 ratio (ref 0.0–4.4)
HDL: 67 mg/dL (ref >39)
LDL Calculated: 78 mg/dL (ref 0–99)
Triglycerides: 69 mg/dL (ref 0–149)
VLDL CHOLESTEROL CAL: 14 mg/dL (ref 5–40)

## 2017-09-24 LAB — MICROALBUMIN / CREATININE URINE RATIO
CREATININE, UR: 91.4 mg/dL
Microalb/Creat Ratio: 53.1 mg/g creat — ABNORMAL HIGH (ref 0.0–30.0)
Microalbumin, Urine: 48.5 ug/mL

## 2017-09-24 LAB — TSH: TSH: 1.12 u[IU]/mL (ref 0.450–4.500)

## 2017-09-25 ENCOUNTER — Encounter: Payer: Self-pay | Admitting: Family Medicine

## 2017-09-25 DIAGNOSIS — F329 Major depressive disorder, single episode, unspecified: Secondary | ICD-10-CM | POA: Insufficient documentation

## 2017-09-30 ENCOUNTER — Telehealth: Payer: Self-pay | Admitting: Family Medicine

## 2017-09-30 NOTE — Telephone Encounter (Signed)
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 I have not seen these forms. Apparently they were completed during her last OV but they were never given to me to make copies of. Can you let me know where these forms might be.  Thank you!

## 2017-10-01 NOTE — Telephone Encounter (Signed)
I will call and see what she is needing. Thank you

## 2017-10-01 NOTE — Telephone Encounter (Signed)
I don't recall completing new FMLA forms since 01/2017; the 01/2017 forms are in media.  Please call to clarify further ---- what did I not complete correctly?  Is she referencing the 01/2017 forms?

## 2017-10-02 ENCOUNTER — Telehealth: Payer: Self-pay | Admitting: Family Medicine

## 2017-10-02 DIAGNOSIS — R6889 Other general symptoms and signs: Secondary | ICD-10-CM | POA: Diagnosis not present

## 2017-10-02 DIAGNOSIS — N813 Complete uterovaginal prolapse: Secondary | ICD-10-CM | POA: Diagnosis not present

## 2017-10-02 DIAGNOSIS — R339 Retention of urine, unspecified: Secondary | ICD-10-CM | POA: Diagnosis not present

## 2017-10-02 NOTE — Telephone Encounter (Signed)
Spoke with patients daughter Leah Stafford about the FMLA forms that Dr Tamala Julian had completed back in [place] states she needs the time extended to more time off because she is having to take more time off with her mom since she is having trouble with her arm and legs swelling more.  So right now the paperwork is set for 1 day a month for 4-5 hours at a time. The patient is wanting it changed to 3 times a month up to 8 hours a day.  I told her I would ask Dr Tamala Julian about doing this but that she may need to come in and see her to get this completed.  Let me know if you want to change the forms that we have on file or if you will need new forms to complete.

## 2017-10-04 ENCOUNTER — Ambulatory Visit: Payer: Medicare HMO | Admitting: Family Medicine

## 2017-10-04 NOTE — Telephone Encounter (Signed)
Please call daughter --- I just saw her mother in follow-up and she was doing much much better.  I do not feel that increasing daughter's FMLA time is warranted at this time.

## 2017-10-07 ENCOUNTER — Ambulatory Visit (INDEPENDENT_AMBULATORY_CARE_PROVIDER_SITE_OTHER): Payer: Medicare HMO | Admitting: Family Medicine

## 2017-10-07 ENCOUNTER — Ambulatory Visit: Payer: Medicare HMO | Admitting: Family Medicine

## 2017-10-07 ENCOUNTER — Encounter: Payer: Self-pay | Admitting: Family Medicine

## 2017-10-07 ENCOUNTER — Other Ambulatory Visit: Payer: Self-pay

## 2017-10-07 VITALS — BP 124/62 | HR 63 | Temp 98.6°F | Resp 17 | Ht 61.81 in | Wt 157.4 lb

## 2017-10-07 DIAGNOSIS — M25431 Effusion, right wrist: Secondary | ICD-10-CM

## 2017-10-07 DIAGNOSIS — R6889 Other general symptoms and signs: Secondary | ICD-10-CM | POA: Diagnosis not present

## 2017-10-07 DIAGNOSIS — H26492 Other secondary cataract, left eye: Secondary | ICD-10-CM

## 2017-10-07 MED ORDER — DICLOFENAC SODIUM 1 % TD GEL: 4.0000 g | Freq: Four times a day (QID) | TRANSDERMAL | 1 refills | Status: DC

## 2017-10-07 MED ORDER — ACETAMINOPHEN 500 MG PO TABS: 1000.0000 mg | ORAL_TABLET | Freq: Three times a day (TID) | ORAL | 0 refills | Status: DC | PRN

## 2017-10-07 NOTE — Progress Notes (Signed)
Chief Complaint  Patient presents with  . right arm/hand pain    onset: thursday, but worsened on Friday/Saturday, per pt hx of arthritis in hand and knees.      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    4 review of systems  Past Medical History:  Diagnosis Date  . Arthritis   . Cataract   . Depression   . Diabetes mellitus without complication (HCC)    Diet and excercise  . Diabetic retinopathy ([place])   . High cholesterol   . Hypertension     Current Outpatient Medications  Medication Sig Dispense Refill  . acetaminophen (TYLENOL) 500 MG tablet Take 2 tablets (1,000 mg total) every 8 (eight) hours as needed by mouth for moderate pain. 30 tablet 0  . Cholecalciferol (VITAMIN D PO) Take 5,000 Units by mouth daily. OTC, strength not known     . diclofenac sodium (VOLTAREN) 1 % GEL Apply 4 g 4 (four) times daily topically. 100 g 1  . lisinopril-hydrochlorothiazide (PRINZIDE,ZESTORETIC) 20-12.5 MG tablet Take 1 tablet by mouth daily. 90 tablet 1  . mirtazapine (REMERON) 7.5 MG tablet Take 1 tablet (7.5 mg total) by mouth at bedtime. 90 tablet 1  . Misc. Devices (COMMODE BEDSIDE) MISC One bedside commode 3 in 1  Dx: osteoarthritis knee 1 each 0  . Misc. Devices (RAISED TOILET SEAT) MISC For use to prevent falls 1 each 0  . Multiple Vitamin (MULTIVITAMIN WITH MINERALS) TABS tablet Take 1 tablet by mouth daily.    . pravastatin (PRAVACHOL) 10 MG tablet Take 1 tablet (10 mg total) by mouth daily. Please send overnight 90 tablet 3  . vitamin C (ASCORBIC  ACID) 500 MG tablet Take 500 mg by mouth daily.    Marland Kitchen aspirin 81 MG tablet Take 81 mg by mouth daily.     . carvedilol (COREG) 12.5 MG tablet Take 1 tablet (12.5 mg total) by mouth 2 (two) times daily with a meal. 180 tablet 1  . DUREZOL 0.05 % EMUL Inject 1 drop into the eye daily.    . Misc. Devices [facility]) MISC Use walker to prevent falls (Patient not taking: Reported on 10/07/2017) 1 each 0  . traMADol (ULTRAM) 50 MG tablet Take 1 tablet (50 mg total) by mouth every 8 (eight) hours as needed. (Patient not taking: Reported on 10/07/2017) 30 tablet 0   No current facility-administered medications for this visit.     Allergies:  Allergies  Allergen Reactions  . Codeine Nausea And Vomiting    Past Surgical History:  Procedure Laterality Date  . EYE SURGERY    . TUBAL LIGATION      Social History   Socioeconomic History  . Marital status: Widowed    Spouse name: None  . Number of children: 3  . Years of education: None  . Highest education level: None  Social Needs  . Financial resource strain: None  . Food insecurity - worry: None  . Food insecurity - inability: None  . Transportation  needs - medical: None  . Transportation needs - non-medical: None  Occupational History  . Occupation: retired  Tobacco Use  . Smoking status: Never Smoker  . Smokeless tobacco: Former Network engineer and Sexual Activity  . Alcohol use: No  . Drug use: No  . Sexual activity: Not Currently  Other Topics Concern  . None  Social History Narrative   Marital status: widowed; not dating in 2018      Children: 3 children; 9 grandchildren; 8 gg.      Lives: alone; daughter five minutes per day.      Employment: retired.       Tobacco: none      Alcohol: none      Exercise: previous walking; no walking during winter months.      ADLs: no driving.  Cleans, washes clothes; cleans house; pays bills.  Grocery shopping with daughter.  No assistant devices in 2018.       Advanced Directives:   None;  FULL CODE; daughter HCPOA.          Family History  Problem Relation Age of Onset  . Cancer Father   . Hyperlipidemia Sister   . Hypertension Sister   . Hypertension Brother   . Hyperlipidemia Brother   . Heart disease Brother      ROS Review of Systems See HPI Constitution: No fevers or chills No malaise No diaphoresis Skin: No rash or itching Eyes: no blurry vision, no double vision GU: no dysuria or hematuria Neuro: no dizziness or headaches    Objective: Vitals:   10/07/17 1157  BP: 124/62  Pulse: 63  Resp: 17  Temp: 98.6 F (37 C)  TempSrc: Oral  SpO2: 100%  Weight: 157 lb 6.4 oz (71.4 kg)  Height: 5' 1.81" (1.57 m)    Physical Exam  Constitutional: She is oriented to person, place, and time. She appears well-developed and well-nourished.  HENT:  Head: Normocephalic and atraumatic.  Eyes: Conjunctivae and EOM are normal.  Cardiovascular: Normal rate, regular rhythm and normal heart sounds.  Pulmonary/Chest: Effort normal and breath sounds normal. No stridor. No respiratory distress.  Neurological: She is alert and oriented to person, place, and time.    Right shoulder with tenderness to palpation of the [facility] joint, the deltoid and the posterior shoulder Limited range of motion noted Elbow exam tender to palpation with difficulty supinating No effusion at the bursa Wrist with swelling that extends to all fingers Pt cannot grasp Difficulty with range of motion Normal pulses  Assessment and Plan Amalie was seen today for right arm/hand pain.  Diagnoses and all orders for this visit:  Swelling of wrist joint, right -     Uric Acid -     Basic metabolic panel  Left posterior capsular opacification -     diclofenac sodium (VOLTAREN) 1 % GEL; Apply 4 g 4 (four) times daily topically.  Other orders -     Cancel: Tdap vaccine greater than or equal to 7yo IM -     acetaminophen (TYLENOL) 500 MG tablet; Take 2 tablets (1,000 mg total) every 8  (eight) hours as needed by mouth for moderate pain.  Continue tylenol Pt cannot tolerate tramadol Discussed follow up with Ortho She declined oral steroid but    Leah Stafford

## 2017-10-07 NOTE — Patient Instructions (Addendum)
Take 2 extra strength tylenol every 8 hours for arthritis pain That is 1000mg  every eight hours for pain    IF you received an x-ray today, you will receive an invoice from [facility]. Please contact [facility] at [phone] with questions or concerns regarding your invoice.   IF you received labwork today, you will receive an invoice from [facility]. Please contact [facility] at [phone] with questions or concerns regarding your invoice.   Our billing staff will not be able to assist you with questions regarding bills from these companies.  You will be contacted with the lab results as soon as they are available. The fastest way to get your results is to activate your My Chart account. Instructions are located on the last page of this paperwork. If you have not heard from [place] regarding the results in 2 weeks, please contact this office.

## 2017-10-08 LAB — BASIC METABOLIC PANEL
BUN / CREAT RATIO: 20 (ref 12–28)
BUN: 15 mg/dL (ref 8–27)
CALCIUM: 9.8 mg/dL (ref 8.7–10.3)
CHLORIDE: 102 mmol/L (ref 96–106)
CO2: 23 mmol/L (ref 20–29)
CREATININE: 0.76 mg/dL (ref 0.57–1.00)
GFR calc Af Amer: 85 mL/min/{1.73_m2} (ref >59)
GFR calc non Af Amer: 74 mL/min/{1.73_m2} (ref >59)
GLUCOSE: 87 mg/dL (ref 65–99)
Potassium: 4.5 mmol/L (ref 3.5–5.2)
Sodium: 143 mmol/L (ref 134–144)

## 2017-10-08 LAB — URIC ACID: URIC ACID: 5.1 mg/dL (ref 2.5–7.1)

## 2017-10-09 NOTE — Telephone Encounter (Signed)
Will call daughter and see what she says

## 2017-10-11 NOTE — Telephone Encounter (Signed)
Patients daughter stated she has an apt with Dr Tamala Julian and she will discuss why she needs these forms updated when she comes in.

## 2017-10-14 ENCOUNTER — Encounter (INDEPENDENT_AMBULATORY_CARE_PROVIDER_SITE_OTHER): Payer: Medicare HMO | Admitting: Ophthalmology

## 2017-10-15 ENCOUNTER — Encounter (INDEPENDENT_AMBULATORY_CARE_PROVIDER_SITE_OTHER): Payer: Self-pay | Admitting: Orthopaedic Surgery

## 2017-10-15 ENCOUNTER — Ambulatory Visit (INDEPENDENT_AMBULATORY_CARE_PROVIDER_SITE_OTHER): Payer: Medicare HMO | Admitting: Orthopaedic Surgery

## 2017-10-15 ENCOUNTER — Ambulatory Visit (INDEPENDENT_AMBULATORY_CARE_PROVIDER_SITE_OTHER): Payer: Medicare HMO

## 2017-10-15 VITALS — BP 149/55 | HR 43 | Resp 16 | Ht 61.0 in | Wt 157.0 lb

## 2017-10-15 DIAGNOSIS — M25511 Pain in right shoulder: Secondary | ICD-10-CM

## 2017-10-15 DIAGNOSIS — R6889 Other general symptoms and signs: Secondary | ICD-10-CM | POA: Diagnosis not present

## 2017-10-15 DIAGNOSIS — G8929 Other chronic pain: Secondary | ICD-10-CM

## 2017-10-15 NOTE — Progress Notes (Signed)
Office Visit Note   Patient: Leah Stafford           Date of Birth: 1934-01-16           MRN: [id number] Visit Date: 10/15/2017              Requested by: Wardell Honour, MD [address] [place], [place] [address] PCP: Wardell Honour, MD   Assessment & Plan: Visit Diagnoses:  1. Chronic right shoulder pain     Plan: Discussed the diagnostic possibilities with [name]. These would include a very mild adhesive capsulitis and possible rotator cuff tear. I think is an initial first step to be worthwhile to inject the shoulder with cortisone. With the possibility she might be sore over Thanksgiving she wanted to wait until next week. The meantime she'll take Tylenol use ice or heat to work on her exercises. We'll plan to see her back early next week and inject the shoulder  Follow-Up Instructions: No Follow-up on file.   Orders:  No orders of the defined types were placed in this encounter.  No orders of the defined types were placed in this encounter.     Procedures: No procedures performed   Clinical Data: No additional findings.   Subjective: Chief Complaint  Patient presents with  . Right Shoulder - Pain  [name] noted insidious onset of right shoulder pain approximately 1 month ago. No history of injury or trauma. Prior to that she had had some left shoulder pain of similar onset which is supple feeling resolved. She's also had some problems with left greater than right knee which at present is somewhat "quieter". In terms of her right shoulder she's had some difficulty raising her arm over her head and sleeping on that side. He denies any problem with her cervical spine. She's not had any numbness or tingling.  HPI  Review of Systems  Constitutional: Negative for chills, fatigue and fever.  Eyes: Negative for itching.  Respiratory: Negative for chest tightness and shortness of breath.   Cardiovascular: Negative for chest pain, palpitations and leg  swelling.  Gastrointestinal: Negative for blood in stool, constipation and diarrhea.  Endocrine: Negative for polyuria.  Genitourinary: Negative for dysuria.  Musculoskeletal: Positive for neck pain. Negative for back pain, joint swelling and neck stiffness.  Allergic/Immunologic: Negative for immunocompromised state.  Neurological: Negative for dizziness and numbness.  Hematological: Does not bruise/bleed easily.  Psychiatric/Behavioral: The patient is not nervous/anxious.      Objective: Vital Signs: BP (!) 149/55   Pulse (!) 43   Resp 16   Ht 5\' 1"  (1.549 m)   Wt 157 lb (71.2 kg)   BMI 29.66 kg/m   Physical Exam  Ortho Exam awake alert and oriented 3. Comfortable sitting. Positive impingement on the extremes of internal and external rotation. Positive empty can testing. Biceps appears to be intact. Mild tenderness in the anterior subacromial region. Pain with abduction and 90. I can passively place her arm fully over her head but with some pain. Some limitation of internal rotation as she can barely touch the middle of her back. Good grip and good release. No pain with range of motion of cervical spine.   Imaging: No results found.   PMFS History: Patient Active Problem List   Diagnosis Date Noted  . Reactive depression 09/25/2017  . Primary osteoarthritis of left knee 07/29/2017  . Primary osteoarthritis of left shoulder 07/29/2017  . 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 Pure hypercholesterolemia 02/12/2017  .  Vitreous hemorrhage of right eye ([place]) 02/07/2016  . Left posterior capsular opacification 08/02/2015  . Pseudophakia of both eyes 08/02/2015  . Renal insufficiency 09/16/2013  . 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 Hypertension 03/30/2013   Past Medical History:  Diagnosis Date  . Arthritis   . Cataract   . Depression   . Diabetes  mellitus without complication (HCC)    Diet and excercise  . Diabetic retinopathy ([place])   . High cholesterol   . Hypertension     Family History  Problem Relation Age of Onset  . Cancer Father   . Hyperlipidemia Sister   . Hypertension Sister   . Hypertension Brother   . Hyperlipidemia Brother   . Heart disease Brother     Past Surgical History:  Procedure Laterality Date  . EYE SURGERY    . TUBAL LIGATION     Social History   Occupational History  . Occupation: retired  Tobacco Use  . Smoking status: Never Smoker  . Smokeless tobacco: Former Network engineer and Sexual Activity  . Alcohol use: No  . Drug use: No  . Sexual activity: Not Currently

## 2017-10-16 ENCOUNTER — Telehealth: Payer: Self-pay | Admitting: Family Medicine

## 2017-10-16 ENCOUNTER — Other Ambulatory Visit: Payer: Self-pay | Admitting: Family Medicine

## 2017-10-16 MED ORDER — MIRTAZAPINE 7.5 MG PO TABS: 7.5000 mg | ORAL_TABLET | Freq: Every day | ORAL | 1 refills | Status: DC

## 2017-10-16 MED ORDER — CARVEDILOL 12.5 MG PO TABS: 12.5000 mg | ORAL_TABLET | Freq: Two times a day (BID) | ORAL | 1 refills | Status: DC

## 2017-10-16 NOTE — Telephone Encounter (Signed)
Mirtazapine and carvedilol both changed to [facility] delivery

## 2017-10-16 NOTE — Telephone Encounter (Signed)
Remeron  rx  Refill  Request

## 2017-10-16 NOTE — Telephone Encounter (Signed)
Copied from [place] [phone]. Topic: Quick Communication - See Telephone Encounter >> Oct 16, 2017  1:15 PM Burnis Medin, NT wrote: CRM for notification. See Telephone encounter for: Pt. Is calling in for a medication refill for mirtazapine (REMERON) 7.5 MG table and  carvedilol (COREG) 12.5 MG tablet. Pt uses [facility]  10/16/17.

## 2017-10-21 ENCOUNTER — Encounter (INDEPENDENT_AMBULATORY_CARE_PROVIDER_SITE_OTHER): Payer: Self-pay | Admitting: Orthopaedic Surgery

## 2017-10-21 ENCOUNTER — Ambulatory Visit (INDEPENDENT_AMBULATORY_CARE_PROVIDER_SITE_OTHER): Payer: Medicare HMO | Admitting: Orthopaedic Surgery

## 2017-10-21 DIAGNOSIS — R6889 Other general symptoms and signs: Secondary | ICD-10-CM | POA: Diagnosis not present

## 2017-10-21 DIAGNOSIS — M25511 Pain in right shoulder: Secondary | ICD-10-CM

## 2017-10-21 DIAGNOSIS — G8929 Other chronic pain: Secondary | ICD-10-CM

## 2017-10-21 MED ORDER — METHYLPREDNISOLONE ACETATE 40 MG/ML IJ SUSP
80.0000 mg | INTRAMUSCULAR | Status: AC | PRN
  Administered 2017-10-21: 80 mg

## 2017-10-21 MED ORDER — BUPIVACAINE HCL 0.5 % IJ SOLN
2.0000 mL | INTRAMUSCULAR | Status: AC | PRN
  Administered 2017-10-21: 2 mL via INTRA_ARTICULAR

## 2017-10-21 MED ORDER — LIDOCAINE HCL 2 % IJ SOLN
2.0000 mL | INTRAMUSCULAR | Status: AC | PRN
  Administered 2017-10-21: 2 mL

## 2017-10-21 NOTE — Progress Notes (Signed)
Office Visit Note   Patient: Leah Stafford           Date of Birth: 1934-06-08           MRN: [id number] Visit Date: 10/21/2017              Requested by: Wardell Honour, MD [address] [place], [place] [address] PCP: Wardell Honour, MD   Assessment & Plan: Visit Diagnoses:  1. Chronic right shoulder pain     Plan: Subacromial cortisone injection right shoulder and evaluated in 2 weeks. Diagnostic possibilities include early adhesive capsulitis versus rotator cuff tear  Follow-Up Instructions: Return in about 2 weeks (around 11/04/2017).   Orders:  No orders of the defined types were placed in this encounter.  No orders of the defined types were placed in this encounter.     Procedures: Large Joint Inj: R subacromial bursa on 10/21/2017 4:08 PM Indications: pain and diagnostic evaluation Details: 25 G 1.5 in needle, anterolateral approach  Arthrogram: No  Medications: 2 mL lidocaine 2 %; 2 mL bupivacaine 0.5 %; 80 mg methylPREDNISolone acetate 40 MG/ML Consent was given by the patient. Immediately prior to procedure a time out was called to verify the correct patient, procedure, equipment, support staff and site/side marked as required. Patient was prepped and draped in the usual sterile fashion.       Clinical Data: No additional findings.   Subjective: No chief complaint on file. Seen last week for evaluation of right shoulder pain as outlined in my office note. Came back today to discuss different treatment options including cortisone injection. She like to try the injection and monitor her response. Difficulty with overhead activity and sleeping on that shoulder  HPI  Review of Systems   Objective: Vital Signs: There were no vitals taken for this visit.  Physical Exam  Ortho Exam awake alert and oriented 3. Comfortable sitting. Positive impingement right shoulder. Having difficulty raising her arm above 100 of flexion and about 70 of abduction.  Some local tenderness along the anterior lateral subacromial space. Skin intact. No crepitation  Specialty Comments:  No specialty comments available.  Imaging: No results found.   PMFS History: Patient Active Problem List   Diagnosis Date Noted  . Reactive depression 09/25/2017  . Primary osteoarthritis of left knee 07/29/2017  . Primary osteoarthritis of left shoulder 07/29/2017  . 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 Pure hypercholesterolemia 02/12/2017  . Vitreous hemorrhage of right eye ([place]) 02/07/2016  . Left posterior capsular opacification 08/02/2015  . Pseudophakia of both eyes 08/02/2015  . Renal insufficiency 09/16/2013  . 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 Hypertension 03/30/2013   Past Medical History:  Diagnosis Date  . Arthritis   . Cataract   . Depression   . Diabetes mellitus without complication (HCC)    Diet and excercise  . Diabetic retinopathy ([place])   . High cholesterol   . Hypertension     Family History  Problem Relation Age of Onset  . Cancer Father   . Hyperlipidemia Sister   . Hypertension Sister   . Hypertension Brother   . Hyperlipidemia Brother   . Heart disease Brother     Past Surgical History:  Procedure Laterality Date  . EYE SURGERY    . TUBAL LIGATION     Social History   Occupational History  . Occupation: retired  Tobacco Use  .  Smoking status: Never Smoker  . Smokeless tobacco: Former Network engineer and Sexual Activity  . Alcohol use: No  . Drug use: No  . Sexual activity: Not Currently     Garald Balding, MD   Note - This record has been created using [employer].  Chart creation errors have been sought, but may not always  have been located. Such creation errors do not reflect on  the standard of medical care.

## 2017-11-04 ENCOUNTER — Ambulatory Visit (INDEPENDENT_AMBULATORY_CARE_PROVIDER_SITE_OTHER): Payer: Medicare HMO | Admitting: Orthopaedic Surgery

## 2017-11-12 ENCOUNTER — Ambulatory Visit: Payer: Medicare HMO | Admitting: Podiatry

## 2017-11-15 ENCOUNTER — Encounter: Payer: Self-pay | Admitting: Podiatry

## 2017-11-15 ENCOUNTER — Ambulatory Visit (INDEPENDENT_AMBULATORY_CARE_PROVIDER_SITE_OTHER): Payer: Medicare HMO | Admitting: Podiatry

## 2017-11-15 DIAGNOSIS — E119 Type 2 diabetes mellitus without complications: Secondary | ICD-10-CM | POA: Diagnosis not present

## 2017-11-15 DIAGNOSIS — R6889 Other general symptoms and signs: Secondary | ICD-10-CM | POA: Diagnosis not present

## 2017-11-15 DIAGNOSIS — B351 Tinea unguium: Secondary | ICD-10-CM | POA: Diagnosis not present

## 2017-11-15 DIAGNOSIS — M79674 Pain in right toe(s): Secondary | ICD-10-CM

## 2017-11-15 DIAGNOSIS — M79675 Pain in left toe(s): Secondary | ICD-10-CM

## 2017-11-16 NOTE — Progress Notes (Signed)
Complaint:  Visit Type: Patient returns to my office for continued preventative foot care services. Complaint: Patient states" my nails have grown long and thick and become painful to walk and wear shoes" Patient has been diagnosed with DM with no foot complications. The patient presents for preventative foot care services. No changes to ROS  Podiatric Exam: Vascular: dorsalis pedis and posterior tibial pulses are palpable bilateral. Capillary return is immediate. Temperature gradient is WNL. Skin turgor WNL  Sensorium: Normal Semmes Weinstein monofilament test. Normal tactile sensation bilaterally. Nail Exam: Pt has thick disfigured discolored nails with subungual debris noted bilateral entire nail hallux through fifth toenails Ulcer Exam: There is no evidence of ulcer or pre-ulcerative changes or infection. Orthopedic Exam: Muscle tone and strength are WNL. No limitations in general ROM. No crepitus or effusions noted. Foot type and digits show no abnormalities. HAV  B/L. Skin: No Porokeratosis. No infection or ulcers.    Diagnosis:  Onychomycosis, , Pain in right toe, pain in left toes  Treatment & Plan Procedures and Treatment: Consent by patient was obtained for treatment procedures.   Debridement of mycotic and hypertrophic toenails, 1 through 5 bilateral and clearing of subungual debris. No ulceration, no infection noted.  Return Visit-Office Procedure: Patient instructed to return to the office for a follow up visit 3 months for continued evaluation and treatment.    Felicia Bloomquist DPM 

## 2017-11-22 ENCOUNTER — Ambulatory Visit (INDEPENDENT_AMBULATORY_CARE_PROVIDER_SITE_OTHER): Payer: Medicare HMO | Admitting: Orthopaedic Surgery

## 2017-11-22 ENCOUNTER — Encounter (INDEPENDENT_AMBULATORY_CARE_PROVIDER_SITE_OTHER): Payer: Self-pay | Admitting: Orthopaedic Surgery

## 2017-11-22 VITALS — BP 132/58 | HR 61 | Resp 16 | Ht 62.0 in | Wt 155.0 lb

## 2017-11-22 DIAGNOSIS — R6889 Other general symptoms and signs: Secondary | ICD-10-CM | POA: Diagnosis not present

## 2017-11-22 DIAGNOSIS — M25511 Pain in right shoulder: Secondary | ICD-10-CM | POA: Diagnosis not present

## 2017-11-22 DIAGNOSIS — G8929 Other chronic pain: Secondary | ICD-10-CM | POA: Diagnosis not present

## 2017-11-22 NOTE — Progress Notes (Signed)
Office Visit Note   Patient: Leah Stafford           Date of Birth: 11/08/1934           MRN: [id number] Visit Date: 11/22/2017              Requested by: Wardell Honour, MD [address] [place], [place] [address] PCP: Wardell Honour, MD   Assessment & Plan: Visit Diagnoses:  1. Chronic right shoulder pain     Plan: Seen several weeks ago for evaluation of right shoulder pain. Subacromial cortisone injection has made a huge difference. In fact it's made a difference with some of her other joint complaints as well. We'll plan to see her back as needed. Suggest anti-inflammatory medicines as needed and exercises  Follow-Up Instructions: Return if symptoms worsen or fail to improve.   Orders:  No orders of the defined types were placed in this encounter.  No orders of the defined types were placed in this encounter.     Procedures: No procedures performed   Clinical Data: No additional findings.   Subjective: Chief Complaint  Patient presents with  . Right Shoulder - Follow-up  . Shoulder Pain    Doing good, no trouble since injection    HPI  Review of Systems  Constitutional: Negative for fatigue.  HENT: Negative for trouble swallowing.   Eyes: Negative for pain.  Respiratory: Negative for chest tightness.   Cardiovascular: Negative for chest pain and leg swelling.  Gastrointestinal: Negative for constipation.  Endocrine: Negative for cold intolerance.  Genitourinary: Negative for difficulty urinating.  Musculoskeletal: Negative for joint swelling.  Skin: Negative for rash.  Allergic/Immunologic: Negative for food allergies.  Neurological: Negative for numbness.  Hematological: Does not bruise/bleed easily.  Psychiatric/Behavioral: Negative for sleep disturbance.     Objective: Vital Signs: BP (!) 132/58 (BP Location: Left Arm, Patient Position: Sitting, Cuff Size: Normal)   Pulse 61   Resp 16   Ht 5\' 2"  (1.575 m)   Wt 155 lb (70.3 kg)   BMI  28.35 kg/m   Physical Exam  Ortho Exam right shoulder with painless range of motion. Negative impingement. Negative empty can testing. Good strength. No pain with overhead motion. Quick active abduction and flexion. Good grip and good release.  Specialty Comments:  No specialty comments available.  Imaging: No results found.   PMFS History: Patient Active Problem List   Diagnosis Date Noted  . Reactive depression 09/25/2017  . Primary osteoarthritis of left knee 07/29/2017  . Primary osteoarthritis of left shoulder 07/29/2017  . 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 Pure hypercholesterolemia 02/12/2017  . Vitreous hemorrhage of right eye ([place]) 02/07/2016  . Left posterior capsular opacification 08/02/2015  . Pseudophakia of both eyes 08/02/2015  . Renal insufficiency 09/16/2013  . 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 Hypertension 03/30/2013   Past Medical History:  Diagnosis Date  . Arthritis   . Cataract   . Depression   . Diabetes mellitus without complication (HCC)    Diet and excercise  . Diabetic retinopathy ([place])   . High cholesterol   . Hypertension     Family History  Problem Relation Age of Onset  . Cancer Father   . Hyperlipidemia Sister   . Hypertension Sister   . Hypertension Brother   . Hyperlipidemia Brother   . Heart disease Brother     Past  Surgical History:  Procedure Laterality Date  . EYE SURGERY    . TUBAL LIGATION     Social History   Occupational History  . Occupation: retired  Tobacco Use  . Smoking status: Never Smoker  . Smokeless tobacco: Former Systems developer    Types: Snuff  Substance and Sexual Activity  . Alcohol use: No  . Drug use: No  . Sexual activity: Not Currently

## 2018-01-07 DIAGNOSIS — Z0100 Encounter for examination of eyes and vision without abnormal findings: Secondary | ICD-10-CM | POA: Diagnosis not present

## 2018-01-08 ENCOUNTER — Telehealth: Payer: Self-pay

## 2018-01-08 ENCOUNTER — Other Ambulatory Visit: Payer: Self-pay | Admitting: Family Medicine

## 2018-01-08 NOTE — Telephone Encounter (Signed)
Copied from [place]. Topic: Quick Communication - See Telephone Encounter >> Jan 08, 2018  3:36 PM Oneta Rack wrote:  Relation to pt: self  Call back number: [phone] Pharmacy: [facility], [place] [phone] ([phone] (Fax)    Reason for call:  Patient requesting pravastatin (PRAVACHOL) 10 MG tablet and mirtazapine (REMERON) 7.5 MG tablet, patient states mail order contacted, please advise >> Jan 08, 2018  3:45 PM Oneta Rack wrote:  Relation to pt: self  Call back number: [phone] Pharmacy: [place], [place] [phone]) [phone] (Fax)    Reason for call:  Patient requesting pravastatin (PRAVACHOL) 10 MG tablet and mirtazapine (REMERON) 7.5 MG tablet, patient states mail order contacted, please advise

## 2018-01-23 ENCOUNTER — Encounter (INDEPENDENT_AMBULATORY_CARE_PROVIDER_SITE_OTHER): Payer: Self-pay | Admitting: Orthopaedic Surgery

## 2018-01-23 ENCOUNTER — Ambulatory Visit (INDEPENDENT_AMBULATORY_CARE_PROVIDER_SITE_OTHER): Payer: Medicare HMO | Admitting: Orthopaedic Surgery

## 2018-01-23 VITALS — BP 150/68 | HR 56 | Resp 12 | Ht 62.0 in | Wt 165.0 lb

## 2018-01-23 DIAGNOSIS — R6889 Other general symptoms and signs: Secondary | ICD-10-CM | POA: Diagnosis not present

## 2018-01-23 DIAGNOSIS — M1712 Unilateral primary osteoarthritis, left knee: Secondary | ICD-10-CM

## 2018-01-23 MED ORDER — LIDOCAINE HCL 1 % IJ SOLN
2.0000 mL | INTRAMUSCULAR | Status: AC | PRN
  Administered 2018-01-23: 2 mL

## 2018-01-23 MED ORDER — METHYLPREDNISOLONE ACETATE 40 MG/ML IJ SUSP
80.0000 mg | INTRAMUSCULAR | Status: AC | PRN
  Administered 2018-01-23: 80 mg

## 2018-01-23 MED ORDER — BUPIVACAINE HCL 0.5 % IJ SOLN
2.0000 mL | INTRAMUSCULAR | Status: AC | PRN
  Administered 2018-01-23: 2 mL via INTRA_ARTICULAR

## 2018-01-23 NOTE — Progress Notes (Deleted)
Office Visit Note   Patient: Leah Stafford           Date of Birth: Mar 31, 1934           MRN: [id number] Visit Date: 01/23/2018              Requested by: Wardell Honour, MD [address] [place],  [id number] PCP: Wardell Honour, MD   Assessment & Plan: Visit Diagnoses: No diagnosis found.  Plan: ***  Follow-Up Instructions: No Follow-up on file.   Orders:  No orders of the defined types were placed in this encounter.  No orders of the defined types were placed in this encounter.     Procedures: No procedures performed   Clinical Data: No additional findings.   Subjective: Chief Complaint  Patient presents with  . Left Knee - Pain, Edema    Ms. Brannum is an 84 y o here for chronic left knee pain, with stiffness.    HPI  Review of Systems  Constitutional: Negative for chills, fatigue and fever.  HENT: Negative for hearing loss and tinnitus.   Eyes: Negative for itching.  Respiratory: Negative for chest tightness and shortness of breath.   Cardiovascular: Negative for chest pain, palpitations and leg swelling.  Gastrointestinal: Negative for blood in stool, constipation and diarrhea.  Endocrine: Negative for polyuria.  Genitourinary: Negative for dysuria.  Musculoskeletal: Negative for back pain, joint swelling, neck pain and neck stiffness.  Allergic/Immunologic: Negative for immunocompromised state.  Neurological: Negative for dizziness, numbness and headaches.  Hematological: Does not bruise/bleed easily.  Psychiatric/Behavioral: Negative for sleep disturbance. The patient is not nervous/anxious.      Objective: Vital Signs: BP (!) 150/68   Pulse (!) 56   Resp 12   Ht 5\' 2"  (1.575 m)   Wt 165 lb (74.8 kg)   BMI 30.18 kg/m   Physical Exam  Ortho Exam  Specialty Comments:  No specialty comments available.  Imaging: No results found.   PMFS History: Patient Active Problem List   Diagnosis Date Noted  . Reactive depression  09/25/2017  . Primary osteoarthritis of left knee 07/29/2017  . Primary osteoarthritis of left shoulder 07/29/2017  . 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 Pure hypercholesterolemia 02/12/2017  . Vitreous hemorrhage of right eye ([facility]) 02/07/2016  . Left posterior capsular opacification 08/02/2015  . Pseudophakia of both eyes 08/02/2015  . Renal insufficiency 09/16/2013  . 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 Hypertension 03/30/2013   Past Medical History:  Diagnosis Date  . Arthritis   . Cataract   . Depression   . Diabetes mellitus without complication (HCC)    Diet and excercise  . Diabetic retinopathy ([facility])   . High cholesterol   . Hypertension     Family History  Problem Relation Age of Onset  . Cancer Father   . Hyperlipidemia Sister   . Hypertension Sister   . Hypertension Brother   . Hyperlipidemia Brother   . Heart disease Brother     Past Surgical History:  Procedure Laterality Date  . EYE SURGERY    . TUBAL LIGATION     Social History   Occupational History  . Occupation: retired  Tobacco Use  . Smoking status: Never Smoker  . Smokeless tobacco: Former Systems developer    Types: Snuff  Substance and Sexual Activity  . Alcohol use: No  . Drug use: No  .  Sexual activity: Not Currently

## 2018-01-23 NOTE — Progress Notes (Signed)
Office Visit Note   Patient: Leah Stafford           Date of Birth: 1934-04-07           MRN: [id number] Visit Date: 01/23/2018              Requested by: Wardell Honour, MD [address] [place], [place] [address] PCP: Wardell Honour, MD   Assessment & Plan: Visit Diagnoses:  1. Primary osteoarthritis of left knee     Plan: Osteoarthritis left knee with associated chondrocalcinosis. We'll plan on injecting cortisone left knee and follow-up as needed  Follow-Up Instructions: Return if symptoms worsen or fail to improve.   Orders:  No orders of the defined types were placed in this encounter.  No orders of the defined types were placed in this encounter.     Procedures: Large Joint Inj: L knee on 01/23/2018 2:45 PM Indications: pain and diagnostic evaluation Details: 25 G 1.5 in needle, anteromedial approach  Arthrogram: No  Medications: 2 mL lidocaine 1 %; 2 mL bupivacaine 0.5 %; 80 mg methylPREDNISolone acetate 40 MG/ML Procedure, treatment alternatives, risks and benefits explained, specific risks discussed. Consent was given by the patient. Patient was prepped and draped in the usual sterile fashion.       Clinical Data: No additional findings.   Subjective: Chief Complaint  Patient presents with  . Left Knee - Pain, Edema    Leah Stafford is an 84 y o here for chronic left knee pain, with stiffness. She has never been injected in knee. Started hurting last week with the rain  Leah Stafford is been seen on several occasions in the past. She's had a cortisone injection in her right shoulder and is doing quite well. She's also been diagnosed with osteoarthritis of both of her knees associated with chondrocalcinosis. She had films in an May of last year on the left and in August of last year on the right. He notes that she's never had any cortisone but is having more trouble on the left and would like to have "shot". No recent injury or trauma. No fever or chills.  Without use of any ambulatory aid she denies any back or groin pain. I did review films of her left knee performed in standing projection from May 2018. She certainly has some decrease in bone density. Joint spaces are relatively well maintained but there is diffuse calcification within the soft tissue including what appears to be a popliteal cyst.  HPI  Review of Systems   Objective: Vital Signs: BP (!) 150/68   Pulse (!) 56   Resp 12   Ht 5\' 2"  (1.575 m)   Wt 165 lb (74.8 kg)   BMI 30.18 kg/m   Physical Exam  Ortho Exam awake alert and oriented 3 comfortable sitting. Does have a mild limp referable to her left knee. Left knee was not hot red or swollen. No increased varus or valgus. Patella crepitation. More medial than lateral joint pain. Little bit of popliteal fullness but no pain. No calf discomfort. Good pulses distally without edema. Full extension and overall 100 of flexion  Specialty Comments:  No specialty comments available.  Imaging: No results found.   PMFS History: Patient Active Problem List   Diagnosis Date Noted  . Reactive depression 09/25/2017  . Primary osteoarthritis of left knee 07/29/2017  . Primary osteoarthritis of left shoulder 07/29/2017  . 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 Pure hypercholesterolemia 02/12/2017  . Vitreous hemorrhage of  right eye ([facility]) 02/07/2016  . Left posterior capsular opacification 08/02/2015  . Pseudophakia of both eyes 08/02/2015  . Renal insufficiency 09/16/2013  . 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 Hypertension 03/30/2013   Past Medical History:  Diagnosis Date  . Arthritis   . Cataract   . Depression   . Diabetes mellitus without complication (HCC)    Diet and excercise  . Diabetic retinopathy ([facility])   . High cholesterol   . Hypertension     Family History  Problem Relation Age of  Onset  . Cancer Father   . Hyperlipidemia Sister   . Hypertension Sister   . Hypertension Brother   . Hyperlipidemia Brother   . Heart disease Brother     Past Surgical History:  Procedure Laterality Date  . EYE SURGERY    . TUBAL LIGATION     Social History   Occupational History  . Occupation: retired  Tobacco Use  . Smoking status: Never Smoker  . Smokeless tobacco: Former Systems developer    Types: Snuff  Substance and Sexual Activity  . Alcohol use: No  . Drug use: No  . Sexual activity: Not Currently     Leah Balding, MD   Note - This record has been created using [employer].  Chart creation errors have been sought, but may not always  have been located. Such creation errors do not reflect on  the standard of medical care.

## 2018-02-01 ENCOUNTER — Other Ambulatory Visit: Payer: Self-pay | Admitting: Family Medicine

## 2018-02-03 NOTE — Telephone Encounter (Signed)
LOV 09/23/17 with Dr. Tamala Julian  Needs appt per pharmacy note.    [place], [place]  [place] refill request

## 2018-02-11 ENCOUNTER — Ambulatory Visit: Payer: Medicare HMO | Admitting: Podiatry

## 2018-02-26 ENCOUNTER — Ambulatory Visit: Payer: Medicare HMO | Admitting: Family Medicine

## 2018-03-03 ENCOUNTER — Encounter: Payer: Self-pay | Admitting: Family Medicine

## 2018-03-03 ENCOUNTER — Ambulatory Visit (INDEPENDENT_AMBULATORY_CARE_PROVIDER_SITE_OTHER): Payer: Medicare HMO | Admitting: Family Medicine

## 2018-03-03 VITALS — BP 128/56 | HR 63 | Temp 98.6°F | Ht 61.0 in | Wt 157.0 lb

## 2018-03-03 DIAGNOSIS — E1121 Type 2 diabetes mellitus with diabetic nephropathy: Secondary | ICD-10-CM | POA: Diagnosis not present

## 2018-03-03 DIAGNOSIS — E113593 Type 2 diabetes mellitus with proliferative diabetic retinopathy without macular edema, bilateral: Secondary | ICD-10-CM | POA: Diagnosis not present

## 2018-03-03 DIAGNOSIS — M19012 Primary osteoarthritis, left shoulder: Secondary | ICD-10-CM

## 2018-03-03 DIAGNOSIS — N289 Disorder of kidney and ureter, unspecified: Secondary | ICD-10-CM | POA: Diagnosis not present

## 2018-03-03 DIAGNOSIS — I10 Essential (primary) hypertension: Secondary | ICD-10-CM | POA: Diagnosis not present

## 2018-03-03 DIAGNOSIS — M1712 Unilateral primary osteoarthritis, left knee: Secondary | ICD-10-CM | POA: Diagnosis not present

## 2018-03-03 DIAGNOSIS — Z794 Long term (current) use of insulin: Secondary | ICD-10-CM

## 2018-03-03 DIAGNOSIS — E7800 Pure hypercholesterolemia, unspecified: Secondary | ICD-10-CM | POA: Diagnosis not present

## 2018-03-03 DIAGNOSIS — F329 Major depressive disorder, single episode, unspecified: Secondary | ICD-10-CM

## 2018-03-03 DIAGNOSIS — R6889 Other general symptoms and signs: Secondary | ICD-10-CM | POA: Diagnosis not present

## 2018-03-03 LAB — POCT URINALYSIS DIP (MANUAL ENTRY)
BILIRUBIN UA: NEGATIVE
BILIRUBIN UA: NEGATIVE mg/dL
Glucose, UA: NEGATIVE mg/dL
Nitrite, UA: NEGATIVE
PROTEIN UA: NEGATIVE mg/dL
SPEC GRAV UA: 1.01 (ref 1.010–1.025)
Urobilinogen, UA: 0.2 E.U./dL
pH, UA: 5.5 (ref 5.0–8.0)

## 2018-03-03 LAB — POCT GLYCOSYLATED HEMOGLOBIN (HGB A1C): HEMOGLOBIN A1C: 7.3

## 2018-03-03 MED ORDER — CARVEDILOL 12.5 MG PO TABS: 12.5000 mg | ORAL_TABLET | Freq: Two times a day (BID) | ORAL | 1 refills | Status: DC

## 2018-03-03 MED ORDER — MIRTAZAPINE 7.5 MG PO TABS: 7.5000 mg | ORAL_TABLET | Freq: Every day | ORAL | 1 refills | Status: DC

## 2018-03-03 MED ORDER — LISINOPRIL-HYDROCHLOROTHIAZIDE 20-12.5 MG PO TABS: 1.0000 | ORAL_TABLET | Freq: Every day | ORAL | 1 refills | Status: DC

## 2018-03-03 NOTE — Patient Instructions (Addendum)
   IF you received an x-ray today, you will receive an invoice from [facility]. Please contact [facility] at [phone] with questions or concerns regarding your invoice.   IF you received labwork today, you will receive an invoice from [facility]. Please contact [facility] at [phone] with questions or concerns regarding your invoice.   Our billing staff will not be able to assist you with questions regarding bills from these companies.  You will be contacted with the lab results as soon as they are available. The fastest way to get your results is to activate your My Chart account. Instructions are located on the last page of this paperwork. If you have not heard from us regarding the results in 2 weeks, please contact this office.      Managing Your Hypertension Hypertension is commonly called high blood pressure. This is when the force of your blood pressing against the walls of your arteries is too strong. Arteries are blood vessels that carry blood from your heart throughout your body. Hypertension forces the heart to work harder to pump blood, and may cause the arteries to become narrow or stiff. Having untreated or uncontrolled hypertension can cause heart attack, stroke, kidney disease, and other problems. What are blood pressure readings? A blood pressure reading consists of a higher number over a lower number. Ideally, your blood pressure should be below 120/80. The first ("top") number is called the systolic pressure. It is a measure of the pressure in your arteries as your heart beats. The second ("bottom") number is called the diastolic pressure. It is a measure of the pressure in your arteries as the heart relaxes. What does my blood pressure reading mean? Blood pressure is classified into four stages. Based on your blood pressure reading, your health care provider may use the following stages to determine what type of treatment you need, if any. Systolic  pressure and diastolic pressure are measured in a unit called mm Hg. Normal  Systolic pressure: below 120.  Diastolic pressure: below 80. Elevated  Systolic pressure: 120-129.  Diastolic pressure: below 80. Hypertension stage 1  Systolic pressure: 130-139.  Diastolic pressure: 80-89. Hypertension stage 2  Systolic pressure: 140 or above.  Diastolic pressure: 90 or above. What health risks are associated with hypertension? Managing your hypertension is an important responsibility. Uncontrolled hypertension can lead to:  A heart attack.  A stroke.  A weakened blood vessel (aneurysm).  Heart failure.  Kidney damage.  Eye damage.  Metabolic syndrome.  Memory and concentration problems.  What changes can I make to manage my hypertension? Hypertension can be managed by making lifestyle changes and possibly by taking medicines. Your health care provider will help you make a plan to bring your blood pressure within a normal range. Eating and drinking  Eat a diet that is high in fiber and potassium, and low in salt (sodium), added sugar, and fat. An example eating plan is called the DASH (Dietary Approaches to Stop Hypertension) diet. To eat this way: ? Eat plenty of fresh fruits and vegetables. Try to fill half of your plate at each meal with fruits and vegetables. ? Eat whole grains, such as whole wheat pasta, brown rice, or whole grain bread. Fill about one quarter of your plate with whole grains. ? Eat low-fat diary products. ? Avoid fatty cuts of meat, processed or cured meats, and poultry with skin. Fill about one quarter of your plate with lean proteins such as fish, chicken without skin, beans, eggs,   and tofu. ? Avoid premade and processed foods. These tend to be higher in sodium, added sugar, and fat.  Reduce your daily sodium intake. Most people with hypertension should eat less than 1,500 mg of sodium a day.  Limit alcohol intake to no more than 1 drink a day  for nonpregnant women and 2 drinks a day for men. One drink equals 12 oz of beer, 5 oz of wine, or 1 oz of hard liquor. Lifestyle  Work with your health care provider to maintain a healthy body weight, or to lose weight. Ask what an ideal weight is for you.  Get at least 30 minutes of exercise that causes your heart to beat faster (aerobic exercise) most days of the week. Activities may include walking, swimming, or biking.  Include exercise to strengthen your muscles (resistance exercise), such as weight lifting, as part of your weekly exercise routine. Try to do these types of exercises for 30 minutes at least 3 days a week.  Do not use any products that contain nicotine or tobacco, such as cigarettes and e-cigarettes. If you need help quitting, ask your health care provider.  Control any long-term (chronic) conditions you have, such as high cholesterol or diabetes. Monitoring  Monitor your blood pressure at home as told by your health care provider. Your personal target blood pressure may vary depending on your medical conditions, your age, and other factors.  Have your blood pressure checked regularly, as often as told by your health care provider. Working with your health care provider  Review all the medicines you take with your health care provider because there may be side effects or interactions.  Talk with your health care provider about your diet, exercise habits, and other lifestyle factors that may be contributing to hypertension.  Visit your health care provider regularly. Your health care provider can help you create and adjust your plan for managing hypertension. Will I need medicine to control my blood pressure? Your health care provider may prescribe medicine if lifestyle changes are not enough to get your blood pressure under control, and if:  Your systolic blood pressure is 130 or higher.  Your diastolic blood pressure is 80 or higher.  Take medicines only as told  by your health care provider. Follow the directions carefully. Blood pressure medicines must be taken as prescribed. The medicine does not work as well when you skip doses. Skipping doses also puts you at risk for problems. Contact a health care provider if:  You think you are having a reaction to medicines you have taken.  You have repeated (recurrent) headaches.  You feel dizzy.  You have swelling in your ankles.  You have trouble with your vision. Get help right away if:  You develop a severe headache or confusion.  You have unusual weakness or numbness, or you feel faint.  You have severe pain in your chest or abdomen.  You vomit repeatedly.  You have trouble breathing. Summary  Hypertension is when the force of blood pumping through your arteries is too strong. If this condition is not controlled, it may put you at risk for serious complications.  Your personal target blood pressure may vary depending on your medical conditions, your age, and other factors. For most people, a normal blood pressure is less than 120/80.  Hypertension is managed by lifestyle changes, medicines, or both. Lifestyle changes include weight loss, eating a healthy, low-sodium diet, exercising more, and limiting alcohol. This information is not intended to replace advice   given to you by your health care provider. Make sure you discuss any questions you have with your health care provider. Document Released: 08/06/2012 Document Revised: 10/10/2016 Document Reviewed: 10/10/2016 Elsevier Interactive Patient Education  2018 [employer]  

## 2018-03-03 NOTE — Progress Notes (Signed)
Subjective:    Patient ID: Leah Stafford, female    DOB: 1934-04-25, 84 y.o.   MRN: [id number]  03/03/2018  Follow-up (follow up on Type 2 diabetes mellitus)    HPI This 84 y.o. female presents for six month evaluation of DMII, hypertension, hypercholesterolemia, osteoarthritis.   Has been seeing Dr. Durward Fortes for L knee osteoarthritis; s/p steroid injection in L knee; also recevied in R shoulder in November 2018. Weight down from last visit. Some swelling in LEFT knee but mild. Not checking blood sugar at home; has not purchased meter yet.  Checking blood pressure at home; checking every week. No medication in five years for diabetes.    BP Readings from Last 3 Encounters:  03/03/18 (!) 128/56  01/23/18 (!) 150/68  11/22/17 (!) 132/58   Wt Readings from Last 3 Encounters:  03/03/18 157 lb (71.2 kg)  01/23/18 165 lb (74.8 kg)  11/22/17 155 lb (70.3 kg)   Immunization History  Administered Date(s) Administered  . Pneumococcal Conjugate-13 11/17/2014  . Pneumococcal Polysaccharide-23 09/18/2013    Review of Systems  Constitutional: Negative for chills, diaphoresis, fatigue and fever.  Eyes: Negative for visual disturbance.  Respiratory: Negative for cough and shortness of breath.   Cardiovascular: Negative for chest pain, palpitations and leg swelling.  Gastrointestinal: Negative for abdominal pain, constipation, diarrhea, nausea and vomiting.  Endocrine: Negative for cold intolerance, heat intolerance, polydipsia, polyphagia and polyuria.  Musculoskeletal: Positive for arthralgias and gait problem.  Neurological: Negative for dizziness, tremors, seizures, syncope, facial asymmetry, speech difficulty, weakness, light-headedness, numbness and headaches.  Psychiatric/Behavioral: Negative for dysphoric mood, self-injury, sleep disturbance and suicidal ideas. The patient is not nervous/anxious.     Past Medical History:  Diagnosis Date  . Arthritis   . Cataract   .  Depression   . Diabetes mellitus without complication (HCC)    Diet and excercise  . Diabetic retinopathy ([place])   . High cholesterol   . Hypertension    Past Surgical History:  Procedure Laterality Date  . EYE SURGERY    . TUBAL LIGATION     Allergies  Allergen Reactions  . Codeine Nausea And Vomiting   Current Outpatient Medications on File Prior to Visit  Medication Sig Dispense Refill  . Cholecalciferol (VITAMIN D PO) Take 5,000 Units by mouth daily. OTC, strength not known     . Dextromethorphan HBr (COUGH SUPPRESSANT PO)     . DUREZOL 0.05 % EMUL Inject 1 drop into the eye daily.    . Incontinence Supply Disposable (ASSURANCE UNDERWEAR WOMENS) MISC     . Misc. Devices (COMMODE BEDSIDE) MISC One bedside commode 3 in 1  Dx: osteoarthritis knee 1 each 0  . Misc. Devices (RAISED TOILET SEAT) MISC For use to prevent falls 1 each 0  . Multiple Vitamin (MULTIVITAMIN WITH MINERALS) TABS tablet Take 1 tablet by mouth daily.    . pravastatin (PRAVACHOL) 10 MG tablet Take 1 tablet (10 mg total) by mouth daily. Please send overnight 90 tablet 3  . vitamin C (ASCORBIC ACID) 500 MG tablet Take 500 mg by mouth daily.    Marland Kitchen acetaminophen (TYLENOL) 500 MG tablet Take 2 tablets (1,000 mg total) every 8 (eight) hours as needed by mouth for moderate pain. 30 tablet 0  . Acetaminophen 500 MG coapsule as needed.    Marland Kitchen aspirin 81 MG tablet Take 81 mg by mouth daily.     . diclofenac sodium (VOLTAREN) 1 % GEL Apply 4 g 4 (four) times daily topically.  100 g 1  . Elastic Bandages & Supports (BACK SUPPORT) MISC     . Misc. Devices (WALKER) MISC Use walker to prevent falls 1 each 0   No current facility-administered medications on file prior to visit.    Social History   Socioeconomic History  . Marital status: Widowed    Spouse name: Not on file  . Number of children: 3  . Years of education: Not on file  . Highest education level: Not on file  Occupational History  . Occupation: retired    Scientific laboratory technician  . Financial resource strain: Not on file  . Food insecurity:    Worry: Not on file    Inability: Not on file  . Transportation needs:    Medical: Not on file    Non-medical: Not on file  Tobacco Use  . Smoking status: Never Smoker  . Smokeless tobacco: Former Systems developer    Types: Snuff  Substance and Sexual Activity  . Alcohol use: No  . Drug use: No  . Sexual activity: Not Currently  Lifestyle  . Physical activity:    Days per week: Not on file    Minutes per session: Not on file  . Stress: Not on file  Relationships  . Social connections:    Talks on phone: Not on file    Gets together: Not on file    Attends religious service: Not on file    Active member of club or organization: Not on file    Attends meetings of clubs or organizations: Not on file    Relationship status: Not on file  . Intimate partner violence:    Fear of current or ex partner: Not on file    Emotionally abused: Not on file    Physically abused: Not on file    Forced sexual activity: Not on file  Other Topics Concern  . Not on file  Social History Narrative   Marital status: widowed; not dating in 2018      Children: 3 children; 9 grandchildren; 8 gg.      Lives: alone; daughter five minutes per day.      Employment: retired.       Tobacco: none      Alcohol: none      Exercise: previous walking; no walking during winter months.      ADLs: no driving.  Cleans, washes clothes; cleans house; pays bills.  Grocery shopping with daughter.  No assistant devices in 2018.       Advanced Directives:  None;  FULL CODE; daughter HCPOA.         Family History  Problem Relation Age of Onset  . Cancer Father   . Hyperlipidemia Sister   . Hypertension Sister   . Hypertension Brother   . Hyperlipidemia Brother   . Heart disease Brother        Objective:    BP (!) 128/56 (BP Location: Left Arm, Patient Position: Sitting, Cuff Size: Normal)   Pulse 63   Temp 98.6 F (37 C) (Oral)   Ht 5'  1" (1.549 m)   Wt 157 lb (71.2 kg)   SpO2 98%   BMI 29.66 kg/m  Physical Exam  Constitutional: She is oriented to person, place, and time. She appears well-developed and well-nourished. No distress.  HENT:  Head: Normocephalic and atraumatic.  Right Ear: External ear normal.  Left Ear: External ear normal.  Nose: Nose normal.  Mouth/Throat: Oropharynx is clear and moist.  Eyes: Pupils are  equal, round, and reactive to light. Conjunctivae and EOM are normal.  Neck: Normal range of motion. Neck supple. Carotid bruit is not present. No thyromegaly present.  Cardiovascular: Normal rate, regular rhythm, normal heart sounds and intact distal pulses. Exam reveals no gallop and no friction rub.  No murmur heard. Pulmonary/Chest: Effort normal and breath sounds normal. She has no wheezes. She has no rales.  Abdominal: Soft. Bowel sounds are normal. She exhibits no distension and no mass. There is no tenderness. There is no rebound and no guarding.  Lymphadenopathy:    She has no cervical adenopathy.  Neurological: She is alert and oriented to person, place, and time. No cranial nerve deficit.  Skin: Skin is warm and dry. No rash noted. She is not diaphoretic. No erythema. No pallor.  Psychiatric: She has a normal mood and affect. Her behavior is normal.   No results found. Depression screen [facility] 2/9 03/03/2018 10/07/2017 09/23/2017 05/28/2017 04/23/2017  Decreased Interest 0 0 0 0 0  Down, Depressed, Hopeless 0 0 0 0 0  PHQ - 2 Score 0 0 0 0 0   Fall Risk  03/03/2018 10/07/2017 09/23/2017 05/28/2017 04/23/2017  Falls in the past year? No No No No No        Assessment & Plan:   1. Type 2 diabetes mellitus with diabetic nephropathy, without long-term current use of insulin ([place])   2. Pure hypercholesterolemia   3. Renal insufficiency   4. Essential hypertension   5. Primary osteoarthritis of left shoulder   6. Primary osteoarthritis of left knee   7. Reactive depression     Stable chronic  medical conditions including diabetes mellitus, hypercholesterolemia, renal sufficiency, essential hypertension.  Obtain labs for chronic disease management.  Refills provided.  Reactive depression: Improving.  Refill of mirtazapine 7.5 mg at bedtime provided.  Osteoarthritis of shoulders and knees: Status post consultation by Dr. Durward Fortes of orthopedics.  Status post physical therapy in the past 6 months with improved ambulation.  Doing well with at this time with no concerns.  Orders Placed This Encounter  Procedures  . Comprehensive metabolic panel  . CBC with Differential/Platelet  . Lipid panel  . POCT glycosylated hemoglobin (Hb A1C)  . POCT urinalysis dipstick   Meds ordered this encounter  Medications  . carvedilol (COREG) 12.5 MG tablet    Sig: Take 1 tablet (12.5 mg total) by mouth 2 (two) times daily with a meal.    Dispense:  180 tablet    Refill:  1  . lisinopril-hydrochlorothiazide (PRINZIDE,ZESTORETIC) 20-12.5 MG tablet    Sig: Take 1 tablet by mouth daily.    Dispense:  90 tablet    Refill:  1  . DISCONTD: mirtazapine (REMERON) 7.5 MG tablet    Sig: Take 1 tablet (7.5 mg total) by mouth at bedtime.    Dispense:  90 tablet    Refill:  1    Return in about 6 months (around 09/02/2018) for complete physical examiniation.   Maisley Hainsworth Elayne Guerin, M.D. Primary [facility] at [facility] [address] [place], [place]  [phone] phone [phone] fax

## 2018-03-04 LAB — LIPID PANEL
Chol/HDL Ratio: 2.4 ratio (ref 0.0–4.4)
Cholesterol, Total: 164 mg/dL (ref 100–199)
HDL: 68 mg/dL (ref >39)
LDL Calculated: 80 mg/dL (ref 0–99)
Triglycerides: 81 mg/dL (ref 0–149)
VLDL Cholesterol Cal: 16 mg/dL (ref 5–40)

## 2018-03-04 LAB — COMPREHENSIVE METABOLIC PANEL
A/G RATIO: 1.3 (ref 1.2–2.2)
ALK PHOS: 90 IU/L (ref 39–117)
ALT: 15 IU/L (ref 0–32)
AST: 22 IU/L (ref 0–40)
Albumin: 4 g/dL (ref 3.5–4.7)
BILIRUBIN TOTAL: 0.6 mg/dL (ref 0.0–1.2)
BUN/Creatinine Ratio: 9 — ABNORMAL LOW (ref 12–28)
BUN: 11 mg/dL (ref 8–27)
CHLORIDE: 98 mmol/L (ref 96–106)
CO2: 23 mmol/L (ref 20–29)
Calcium: 9.9 mg/dL (ref 8.7–10.3)
Creatinine, Ser: 1.26 mg/dL — ABNORMAL HIGH (ref 0.57–1.00)
GFR calc Af Amer: 46 mL/min/{1.73_m2} — ABNORMAL LOW (ref >59)
GFR calc non Af Amer: 40 mL/min/{1.73_m2} — ABNORMAL LOW (ref >59)
GLOBULIN, TOTAL: 3.1 g/dL (ref 1.5–4.5)
Glucose: 139 mg/dL — ABNORMAL HIGH (ref 65–99)
POTASSIUM: 4.1 mmol/L (ref 3.5–5.2)
SODIUM: 136 mmol/L (ref 134–144)
Total Protein: 7.1 g/dL (ref 6.0–8.5)

## 2018-03-04 LAB — CBC WITH DIFFERENTIAL/PLATELET
BASOS ABS: 0 10*3/uL (ref 0.0–0.2)
Basos: 0 %
EOS (ABSOLUTE): 0.2 10*3/uL (ref 0.0–0.4)
Eos: 5 %
Hematocrit: 29.4 % — ABNORMAL LOW (ref 34.0–46.6)
Hemoglobin: 10.3 g/dL — ABNORMAL LOW (ref 11.1–15.9)
IMMATURE GRANULOCYTES: 0 %
Immature Grans (Abs): 0 10*3/uL (ref 0.0–0.1)
Lymphocytes Absolute: 1.2 10*3/uL (ref 0.7–3.1)
Lymphs: 25 %
MCH: 31.2 pg (ref 26.6–33.0)
MCHC: 35 g/dL (ref 31.5–35.7)
MCV: 89 fL (ref 79–97)
MONOS ABS: 0.7 10*3/uL (ref 0.1–0.9)
Monocytes: 13 %
NEUTROS PCT: 57 %
Neutrophils Absolute: 2.9 10*3/uL (ref 1.4–7.0)
PLATELETS: 293 10*3/uL (ref 150–379)
RBC: 3.3 x10E6/uL — ABNORMAL LOW (ref 3.77–5.28)
RDW: 14 % (ref 12.3–15.4)
WBC: 5 10*3/uL (ref 3.4–10.8)

## 2018-03-11 ENCOUNTER — Encounter: Payer: Self-pay | Admitting: Podiatry

## 2018-03-11 ENCOUNTER — Encounter: Payer: Self-pay | Admitting: Family Medicine

## 2018-03-11 ENCOUNTER — Ambulatory Visit (INDEPENDENT_AMBULATORY_CARE_PROVIDER_SITE_OTHER): Payer: Medicare HMO | Admitting: Podiatry

## 2018-03-11 DIAGNOSIS — M79675 Pain in left toe(s): Secondary | ICD-10-CM

## 2018-03-11 DIAGNOSIS — B351 Tinea unguium: Secondary | ICD-10-CM

## 2018-03-11 DIAGNOSIS — R6889 Other general symptoms and signs: Secondary | ICD-10-CM | POA: Diagnosis not present

## 2018-03-11 DIAGNOSIS — M79674 Pain in right toe(s): Secondary | ICD-10-CM | POA: Diagnosis not present

## 2018-03-11 DIAGNOSIS — E119 Type 2 diabetes mellitus without complications: Secondary | ICD-10-CM | POA: Diagnosis not present

## 2018-03-11 NOTE — Progress Notes (Signed)
Complaint:  Visit Type: Patient returns to my office for continued preventative foot care services. Complaint: Patient states" my nails have grown long and thick and become painful to walk and wear shoes" Patient has been diagnosed with DM with no foot complications. The patient presents for preventative foot care services. No changes to ROS  Podiatric Exam: Vascular: dorsalis pedis and posterior tibial pulses are palpable bilateral. Capillary return is immediate. Temperature gradient is WNL. Skin turgor WNL  Sensorium: Normal Semmes Weinstein monofilament test. Normal tactile sensation bilaterally. Nail Exam: Pt has thick disfigured discolored nails with subungual debris noted bilateral entire nail hallux through fifth toenails Ulcer Exam: There is no evidence of ulcer or pre-ulcerative changes or infection. Orthopedic Exam: Muscle tone and strength are WNL. No limitations in general ROM. No crepitus or effusions noted. Foot type and digits show no abnormalities. HAV  B/L. Skin: No Porokeratosis. No infection or ulcers.    Diagnosis:  Onychomycosis, , Pain in right toe, pain in left toes  Treatment & Plan Procedures and Treatment: Consent by patient was obtained for treatment procedures.   Debridement of mycotic and hypertrophic toenails, 1 through 5 bilateral and clearing of subungual debris. No ulceration, no infection noted.  Return Visit-Office Procedure: Patient instructed to return to the office for a follow up visit 3 months for continued evaluation and treatment.    Goldman Birchall DPM 

## 2018-03-26 ENCOUNTER — Other Ambulatory Visit: Payer: Self-pay | Admitting: Family Medicine

## 2018-04-07 ENCOUNTER — Other Ambulatory Visit: Payer: Self-pay | Admitting: Family Medicine

## 2018-04-07 NOTE — Telephone Encounter (Signed)
LOV 03/03/2018 with Dr. Tamala Julian / Refill request for Carvedilol / Refill not appropriate / Last written:  Disp Refills Start End   carvedilol (COREG) 12.5 MG tablet 180 tablet 1 03/03/2018

## 2018-04-09 ENCOUNTER — Other Ambulatory Visit: Payer: Self-pay | Admitting: Family Medicine

## 2018-04-23 ENCOUNTER — Encounter: Payer: Self-pay | Admitting: Family Medicine

## 2018-05-06 ENCOUNTER — Encounter: Payer: Self-pay | Admitting: Family Medicine

## 2018-05-08 ENCOUNTER — Telehealth: Payer: Self-pay

## 2018-05-08 NOTE — Progress Notes (Signed)
Sent message to Dr. Tamala Julian and Caryl Comes to check on paperwork.

## 2018-05-08 NOTE — Telephone Encounter (Signed)
Copied from [place] [id number]. Topic: General - Other >> May 06, 2018 12:00 PM Marin Olp L wrote: Reason for CRM: Patient daughter Redmond Pulling, faxed over a certification form to be completed by Dr. Tamala Julian for [employer] transportation services and requested for it to be mail back to the patient.  She is calling to check on the status as it was faxed around 05/27.  >> May 07, 2018  3:59 PM Robina Ade, Helene Kelp D wrote: Patient needs paperwork before end of week to turn in for her transportation.

## 2018-05-08 NOTE — Telephone Encounter (Signed)
Copied from [place] [id number]. Topic: General - Other >> May 06, 2018 12:00 PM Marin Olp L wrote: Reason for CRM: Patient daughter Redmond Pulling, faxed over a certification form to be completed by Dr. Tamala Julian for [employer] transportation services and requested for it to be mail back to the patient.  She is calling to check on the status as it was faxed around 05/27.  >> May 07, 2018  3:59 PM Robina Ade, Helene Kelp D wrote: Patient needs paperwork before end of week to turn in for her transportation.    Sent message to Dr. Tamala Julian and Caryl Comes to check on status of form-high priority.

## 2018-05-09 NOTE — Telephone Encounter (Signed)
Form has been completed and faxed.  Can we make a copy for patient and mail to her?

## 2018-05-16 ENCOUNTER — Other Ambulatory Visit: Payer: Self-pay | Admitting: Family Medicine

## 2018-06-10 ENCOUNTER — Ambulatory Visit: Payer: Medicare HMO | Admitting: Podiatry

## 2018-06-16 ENCOUNTER — Other Ambulatory Visit: Payer: Self-pay | Admitting: Family Medicine

## 2018-06-18 ENCOUNTER — Other Ambulatory Visit: Payer: Self-pay | Admitting: Family Medicine

## 2018-06-18 DIAGNOSIS — Z4689 Encounter for fitting and adjustment of other specified devices: Secondary | ICD-10-CM | POA: Diagnosis not present

## 2018-06-18 DIAGNOSIS — R6889 Other general symptoms and signs: Secondary | ICD-10-CM | POA: Diagnosis not present

## 2018-06-18 DIAGNOSIS — N813 Complete uterovaginal prolapse: Secondary | ICD-10-CM | POA: Diagnosis not present

## 2018-06-23 ENCOUNTER — Other Ambulatory Visit: Payer: Self-pay | Admitting: Family Medicine

## 2018-07-07 DIAGNOSIS — Z961 Presence of intraocular lens: Secondary | ICD-10-CM | POA: Diagnosis not present

## 2018-07-07 DIAGNOSIS — E113592 Type 2 diabetes mellitus with proliferative diabetic retinopathy without macular edema, left eye: Secondary | ICD-10-CM | POA: Diagnosis not present

## 2018-07-07 DIAGNOSIS — R6889 Other general symptoms and signs: Secondary | ICD-10-CM | POA: Diagnosis not present

## 2018-07-07 DIAGNOSIS — H26492 Other secondary cataract, left eye: Secondary | ICD-10-CM | POA: Diagnosis not present

## 2018-07-07 LAB — HM DIABETES EYE EXAM

## 2018-07-08 ENCOUNTER — Telehealth: Payer: Self-pay | Admitting: Family Medicine

## 2018-07-08 NOTE — Telephone Encounter (Signed)
Patients daughter came into the clinic on 07/07/18 stating that [employer] had only gotten pages 1 and 3 they did not get page 2. So she brought in another packet that needs to be completed by Dr Tamala Julian or whoever is covering her back this week. Looking at what was scanned into her chart we did not get a copy of page 2 either. I am sending this to [place] to let her know and see if maybe she has an extra copy.  Please mail a completed copy to the patients address once it is finished along with faxing it to [phone]  I will place the forms at [facility] desk at [address]

## 2018-07-11 NOTE — Telephone Encounter (Signed)
Leah Stafford calling to give Leah Stafford the fax number to fax the forms to Fax#: [phone]

## 2018-07-18 ENCOUNTER — Other Ambulatory Visit: Payer: Self-pay | Admitting: Family Medicine

## 2018-07-24 NOTE — Telephone Encounter (Addendum)
Pt is calling back checking on the status of [employer] paperwork. Pt would like a copy mail to her home address to keep on file

## 2018-07-25 ENCOUNTER — Telehealth: Payer: Self-pay | Admitting: Family Medicine

## 2018-07-25 NOTE — Telephone Encounter (Signed)
Copied from [place] [id number]. Topic: General - Other >> Jul 25, 2018  1:22 PM Yvette Rack wrote: Reason for CRM: Pt calling again checking on the status of [employer] paperwork. Pt states neither she or [employer] has received the paperwork and she would like a call back with an update. Cb# [phone]

## 2018-07-29 NOTE — Telephone Encounter (Signed)
Patient calling to check on [employer] forms which will allow her to have transportation to and from doctors appointments.

## 2018-07-31 NOTE — Telephone Encounter (Signed)
Spoke with pt and she advises she has not heard back regarding status of [employer] forms.  Found pt forms and placed in blue folder in [facility] box for completion at 08/07/18 st 1:40 pm re-establish care appt with [facility]. (former [facility] pt).  Advised pt we need to bring her in 1st to re-establish care with new provider then we will bring her back in Oct. After 29th for wellness exam and medication refills.  Pt agreeable.    FYI Dgaddy, CMA

## 2018-07-31 NOTE — Telephone Encounter (Signed)
Pt has appt with dr Nolon Rod on 08/07/18 at 1:40 pm and [employer] forms will be addressed at this visit.  Pt agreeable and notified. Dgaddy, CMA

## 2018-08-07 ENCOUNTER — Encounter: Payer: Self-pay | Admitting: Family Medicine

## 2018-08-07 ENCOUNTER — Ambulatory Visit (INDEPENDENT_AMBULATORY_CARE_PROVIDER_SITE_OTHER): Payer: Medicare HMO | Admitting: Family Medicine

## 2018-08-07 ENCOUNTER — Other Ambulatory Visit: Payer: Self-pay

## 2018-08-07 VITALS — BP 139/69 | HR 56 | Temp 98.8°F | Resp 16 | Ht 61.0 in | Wt 151.0 lb

## 2018-08-07 DIAGNOSIS — Z029 Encounter for administrative examinations, unspecified: Secondary | ICD-10-CM

## 2018-08-07 DIAGNOSIS — R6889 Other general symptoms and signs: Secondary | ICD-10-CM | POA: Diagnosis not present

## 2018-08-07 DIAGNOSIS — M19012 Primary osteoarthritis, left shoulder: Secondary | ICD-10-CM | POA: Diagnosis not present

## 2018-08-07 NOTE — Progress Notes (Signed)
Chief Complaint  Patient presents with  . re-establish care    [employer] form completion, no concerns per pt    HPI  This is a patient of Dr. Tamala Julian who needs to establish care She needs transportation assistance due to her difficulty with mobility and vision She reports that she gets [employer] to transport her to the doctor but cannot get to work or church or the grocery store She uses a walker or cane sometimes   Past Medical History:  Diagnosis Date  . Arthritis   . Cataract   . Depression   . Diabetes mellitus without complication (HCC)    Diet and excercise  . Diabetic retinopathy ([place])   . High cholesterol   . Hypertension     Current Outpatient Medications  Medication Sig Dispense Refill  . acetaminophen (TYLENOL) 500 MG tablet Take 2 tablets (1,000 mg total) every 8 (eight) hours as needed by mouth for moderate pain. 30 tablet 0  . carvedilol (COREG) 12.5 MG tablet TAKE 1 TABLET TWICE DAILY WITH A MEAL 180 tablet 0  . Cholecalciferol (VITAMIN D PO) Take 5,000 Units by mouth daily. OTC, strength not known     . diclofenac sodium (VOLTAREN) 1 % GEL Apply 4 g 4 (four) times daily topically. 100 g 1  . DUREZOL 0.05 % EMUL Inject 1 drop into the eye daily.    Regino Schultze Bandages & Supports (BACK SUPPORT) MISC     . Incontinence Supply Disposable (ASSURANCE UNDERWEAR WOMENS) MISC     . lisinopril-hydrochlorothiazide (PRINZIDE,ZESTORETIC) 20-12.5 MG tablet TAKE 1 TABLET EVERY DAY (NEED MD APPOINTMENT) 90 tablet 0  . Multiple Vitamin (MULTIVITAMIN WITH MINERALS) TABS tablet Take 1 tablet by mouth daily.    . pravastatin (PRAVACHOL) 10 MG tablet TAKE 1 TABLET EVERY DAY 90 tablet 3  . vitamin C (ASCORBIC ACID) 500 MG tablet Take 500 mg by mouth daily.    Marland Kitchen aspirin 81 MG tablet Take 81 mg by mouth daily.     . mirtazapine (REMERON) 7.5 MG tablet TAKE 1 TABLET AT BEDTIME (Patient not taking: Reported on 08/07/2018) 90 tablet 1  . Misc. Devices (COMMODE BEDSIDE) MISC One bedside commode 3  in 1  Dx: osteoarthritis knee (Patient not taking: Reported on 08/07/2018) 1 each 0  . Misc. Devices (RAISED TOILET SEAT) MISC For use to prevent falls (Patient not taking: Reported on 08/07/2018) 1 each 0  . Misc. Devices [facility]) MISC Use walker to prevent falls (Patient not taking: Reported on 08/07/2018) 1 each 0   No current facility-administered medications for this visit.     Allergies:  Allergies  Allergen Reactions  . Codeine Nausea And Vomiting    Past Surgical History:  Procedure Laterality Date  . EYE SURGERY    . TUBAL LIGATION      Social History   Socioeconomic History  . Marital status: Widowed    Spouse name: Not on file  . Number of children: 3  . Years of education: Not on file  . Highest education level: Not on file  Occupational History  . Occupation: retired  Scientific laboratory technician  . Financial resource strain: Not on file  . Food insecurity:    Worry: Not on file    Inability: Not on file  . Transportation needs:    Medical: Not on file    Non-medical: Not on file  Tobacco Use  . Smoking status: Never Smoker  . Smokeless tobacco: Former Systems developer    Types: Snuff  Substance and  Sexual Activity  . Alcohol use: No  . Drug use: No  . Sexual activity: Not Currently  Lifestyle  . Physical activity:    Days per week: Not on file    Minutes per session: Not on file  . Stress: Not on file  Relationships  . Social connections:    Talks on phone: Not on file    Gets together: Not on file    Attends religious service: Not on file    Active member of club or organization: Not on file    Attends meetings of clubs or organizations: Not on file    Relationship status: Not on file  Other Topics Concern  . Not on file  Social History Narrative   Marital status: widowed; not dating in 2018      Children: 3 children; 9 grandchildren; 8 gg.      Lives: alone; daughter five minutes per day.      Employment: retired.       Tobacco: none      Alcohol: none       Exercise: previous walking; no walking during winter months.      ADLs: no driving.  Cleans, washes clothes; cleans house; pays bills.  Grocery shopping with daughter.  No assistant devices in 2018.       Advanced Directives:  None;  FULL CODE; daughter HCPOA.          Family History  Problem Relation Age of Onset  . Cancer Father   . Hyperlipidemia Sister   . Hypertension Sister   . Hypertension Brother   . Hyperlipidemia Brother   . Heart disease Brother      ROS Review of Systems See HPI Constitution: No fevers or chills No malaise No diaphoresis Skin: No rash or itching Eyes: no blurry vision, no double vision GU: no dysuria or hematuria Neuro: no dizziness or headaches * all others reviewed and negative   Objective: Vitals:   08/07/18 1345  BP: 139/69  Pulse: (!) 56  Resp: 16  Temp: 98.8 F (37.1 C)  TempSrc: Oral  SpO2: 98%  Weight: 151 lb (68.5 kg)  Height: 5\' 1"  (1.549 m)    Physical Exam  Constitutional: She is oriented to person, place, and time. She appears well-developed and well-nourished.  Ambulates slowly and hold on to furniture  HENT:  Head: Normocephalic and atraumatic.  Pulmonary/Chest: Effort normal.  Neurological: She is alert and oriented to person, place, and time.  Skin: Skin is warm. Capillary refill takes less than 2 seconds.  Psychiatric: She has a normal mood and affect. Her behavior is normal. Judgment and thought content normal.    Assessment and Plan Leah Stafford was seen today for re-establish care.  Diagnoses and all orders for this visit:  Primary osteoarthritis of left shoulder  Administrative encounter  -  Completed forms and sent for faxing Discussed that she should use her cane more to prevent falls   [place]

## 2018-08-07 NOTE — Patient Instructions (Addendum)
If you have lab work done today you will be contacted with your lab results within the next 2 weeks.  If you have not heard from [place] then please contact us. The fastest way to get your results is to register for My Chart.   IF you received an x-ray today, you will receive an invoice from [facility]. Please contact [facility] at [phone] with questions or concerns regarding your invoice.   IF you received labwork today, you will receive an invoice from [place]. Please contact [facility] at [phone] with questions or concerns regarding your invoice.   Our billing staff will not be able to assist you with questions regarding bills from these companies.  You will be contacted with the lab results as soon as they are available. The fastest way to get your results is to activate your My Chart account. Instructions are located on the last page of this paperwork. If you have not heard from [place] regarding the results in 2 weeks, please contact this office.     Fall Prevention in the Home Falls can cause injuries. They can happen to people of all ages. There are many things you can do to make your home safe and to help prevent falls. What can I do on the outside of my home?  Regularly fix the edges of walkways and driveways and fix any cracks.  Remove anything that might make you trip as you walk through a door, such as a raised step or threshold.  Trim any bushes or trees on the path to your home.  Use bright outdoor lighting.  Clear any walking paths of anything that might make someone trip, such as rocks or tools.  Regularly check to see if handrails are loose or broken. Make sure that both sides of any steps have handrails.  Any raised decks and porches should have guardrails on the edges.  Have any leaves, snow, or ice cleared regularly.  Use sand or salt on walking paths during winter.  Clean up any spills in your garage right away. This includes oil or  grease spills. What can I do in the bathroom?  Use night lights.  Install grab bars by the toilet and in the tub and shower. Do not use towel bars as grab bars.  Use non-skid mats or decals in the tub or shower.  If you need to sit down in the shower, use a plastic, non-slip stool.  Keep the floor dry. Clean up any water that spills on the floor as soon as it happens.  Remove soap buildup in the tub or shower regularly.  Attach bath mats securely with double-sided non-slip rug tape.  Do not have throw rugs and other things on the floor that can make you trip. What can I do in the bedroom?  Use night lights.  Make sure that you have a light by your bed that is easy to reach.  Do not use any sheets or blankets that are too big for your bed. They should not hang down onto the floor.  Have a firm chair that has side arms. You can use this for support while you get dressed.  Do not have throw rugs and other things on the floor that can make you trip. What can I do in the kitchen?  Clean up any spills right away.  Avoid walking on wet floors.  Keep items that you use a lot in easy-to-reach places.  If you need to reach  something above you, use a strong step stool that has a grab bar.  Keep electrical cords out of the way.  Do not use floor polish or wax that makes floors slippery. If you must use wax, use non-skid floor wax.  Do not have throw rugs and other things on the floor that can make you trip. What can I do with my stairs?  Do not leave any items on the stairs.  Make sure that there are handrails on both sides of the stairs and use them. Fix handrails that are broken or loose. Make sure that handrails are as long as the stairways.  Check any carpeting to make sure that it is firmly attached to the stairs. Fix any carpet that is loose or worn.  Avoid having throw rugs at the top or bottom of the stairs. If you do have throw rugs, attach them to the floor with  carpet tape.  Make sure that you have a light switch at the top of the stairs and the bottom of the stairs. If you do not have them, ask someone to add them for you. What else can I do to help prevent falls?  Wear shoes that: ? Do not have high heels. ? Have rubber bottoms. ? Are comfortable and fit you well. ? Are closed at the toe. Do not wear sandals.  If you use a stepladder: ? Make sure that it is fully opened. Do not climb a closed stepladder. ? Make sure that both sides of the stepladder are locked into place. ? Ask someone to hold it for you, if possible.  Clearly mark and make sure that you can see: ? Any grab bars or handrails. ? First and last steps. ? Where the edge of each step is.  Use tools that help you move around (mobility aids) if they are needed. These include: ? Canes. ? Walkers. ? Scooters. ? Crutches.  Turn on the lights when you go into a dark area. Replace any light bulbs as soon as they burn out.  Set up your furniture so you have a clear path. Avoid moving your furniture around.  If any of your floors are uneven, fix them.  If there are any pets around you, be aware of where they are.  Review your medicines with your doctor. Some medicines can make you feel dizzy. This can increase your chance of falling. Ask your doctor what other things that you can do to help prevent falls. This information is not intended to replace advice given to you by your health care provider. Make sure you discuss any questions you have with your health care provider. Document Released: 09/08/2009 Document Revised: 04/19/2016 Document Reviewed: 12/17/2014 Elsevier Interactive Patient Education  [employer].

## 2018-08-08 ENCOUNTER — Ambulatory Visit: Payer: Medicare HMO | Admitting: Family Medicine

## 2018-08-27 ENCOUNTER — Other Ambulatory Visit: Payer: Self-pay | Admitting: Family Medicine

## 2018-09-08 ENCOUNTER — Encounter: Payer: Medicare HMO | Admitting: Family Medicine

## 2018-09-18 ENCOUNTER — Telehealth: Payer: Self-pay | Admitting: Family Medicine

## 2018-09-18 NOTE — Telephone Encounter (Signed)
Copied from [place] [id number]. Topic: General - Other >> Sep 17, 2018  1:08 PM Alfredia Ferguson R wrote: Patient is calling in wanting to know what her cholesterol was 02/2018.

## 2018-09-18 NOTE — Telephone Encounter (Signed)
Called & spoke with pt.  Gave her total cholesterol and A1c.

## 2018-09-22 ENCOUNTER — Encounter: Payer: Self-pay | Admitting: *Deleted

## 2018-09-22 ENCOUNTER — Other Ambulatory Visit: Payer: Self-pay | Admitting: Family Medicine

## 2018-09-23 ENCOUNTER — Encounter

## 2018-09-23 ENCOUNTER — Ambulatory Visit (INDEPENDENT_AMBULATORY_CARE_PROVIDER_SITE_OTHER): Payer: Medicare HMO | Admitting: Podiatry

## 2018-09-23 ENCOUNTER — Encounter: Payer: Self-pay | Admitting: Podiatry

## 2018-09-23 DIAGNOSIS — M79675 Pain in left toe(s): Secondary | ICD-10-CM | POA: Diagnosis not present

## 2018-09-23 DIAGNOSIS — E119 Type 2 diabetes mellitus without complications: Secondary | ICD-10-CM | POA: Diagnosis not present

## 2018-09-23 DIAGNOSIS — B351 Tinea unguium: Secondary | ICD-10-CM | POA: Diagnosis not present

## 2018-09-23 DIAGNOSIS — M79674 Pain in right toe(s): Secondary | ICD-10-CM | POA: Diagnosis not present

## 2018-09-23 NOTE — Progress Notes (Signed)
Complaint:  Visit Type: Patient returns to my office for continued preventative foot care services. Complaint: Patient states" my nails have grown long and thick and become painful to walk and wear shoes" Patient has been diagnosed with DM with no foot complications. The patient presents for preventative foot care services. No changes to ROS.  Patient has not been seen in over 6 months.  Podiatric Exam: Vascular: dorsalis pedis and posterior tibial pulses are palpable bilateral. Capillary return is immediate. Temperature gradient is WNL. Skin turgor WNL  Sensorium: Normal Semmes Weinstein monofilament test. Normal tactile sensation bilaterally. Nail Exam: Pt has thick disfigured discolored nails with subungual debris noted bilateral entire nail hallux through fifth toenails Ulcer Exam: There is no evidence of ulcer or pre-ulcerative changes or infection. Orthopedic Exam: Muscle tone and strength are WNL. No limitations in general ROM. No crepitus or effusions noted. Foot type and digits show no abnormalities.  HAV  B/L. Skin: No Porokeratosis. No infection or ulcers  Diagnosis:  Onychomycosis, , Pain in right toe, pain in left toes  Treatment & Plan Procedures and Treatment: Consent by patient was obtained for treatment procedures.   Debridement of mycotic and hypertrophic toenails, 1 through 5 bilateral and clearing of subungual debris. No ulceration, no infection noted.  Return Visit-Office Procedure: Patient instructed to return to the office for a follow up visit 3 months for continued evaluation and treatment.    Gardiner Barefoot DPM

## 2018-09-27 IMAGING — DX DG KNEE COMPLETE 4+V*L*
4 series · 4 of 4 positions shown · non-contrast
Comparison: None.

CLINICAL DATA: Left knee stiffness and posterior knee pain

EXAM:
LEFT KNEE - COMPLETE 4+ VIEW

[knee ap]
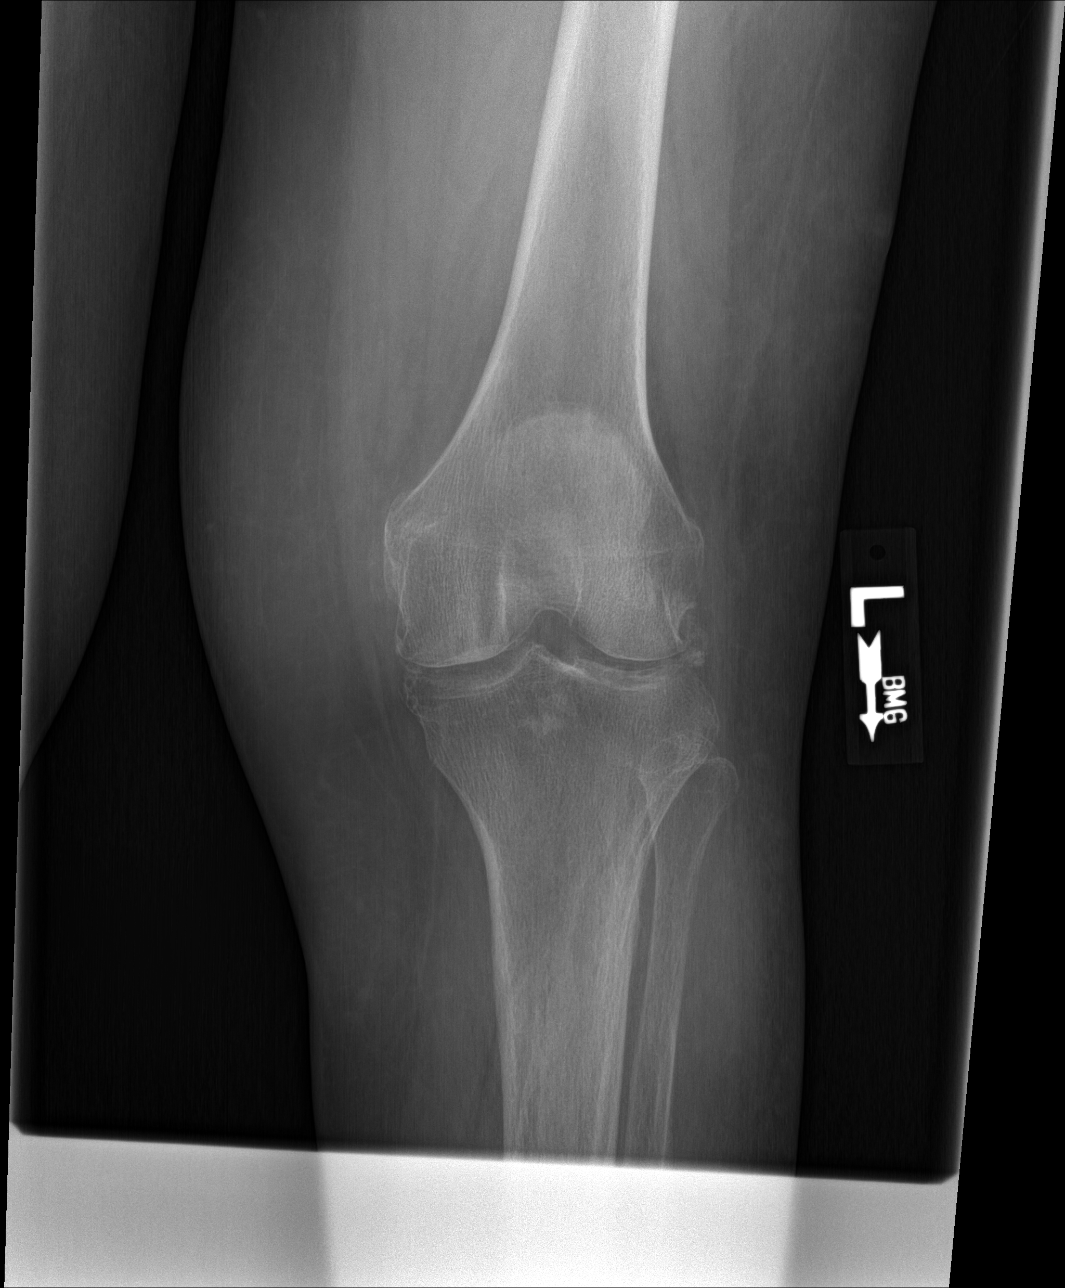

[knee lat]
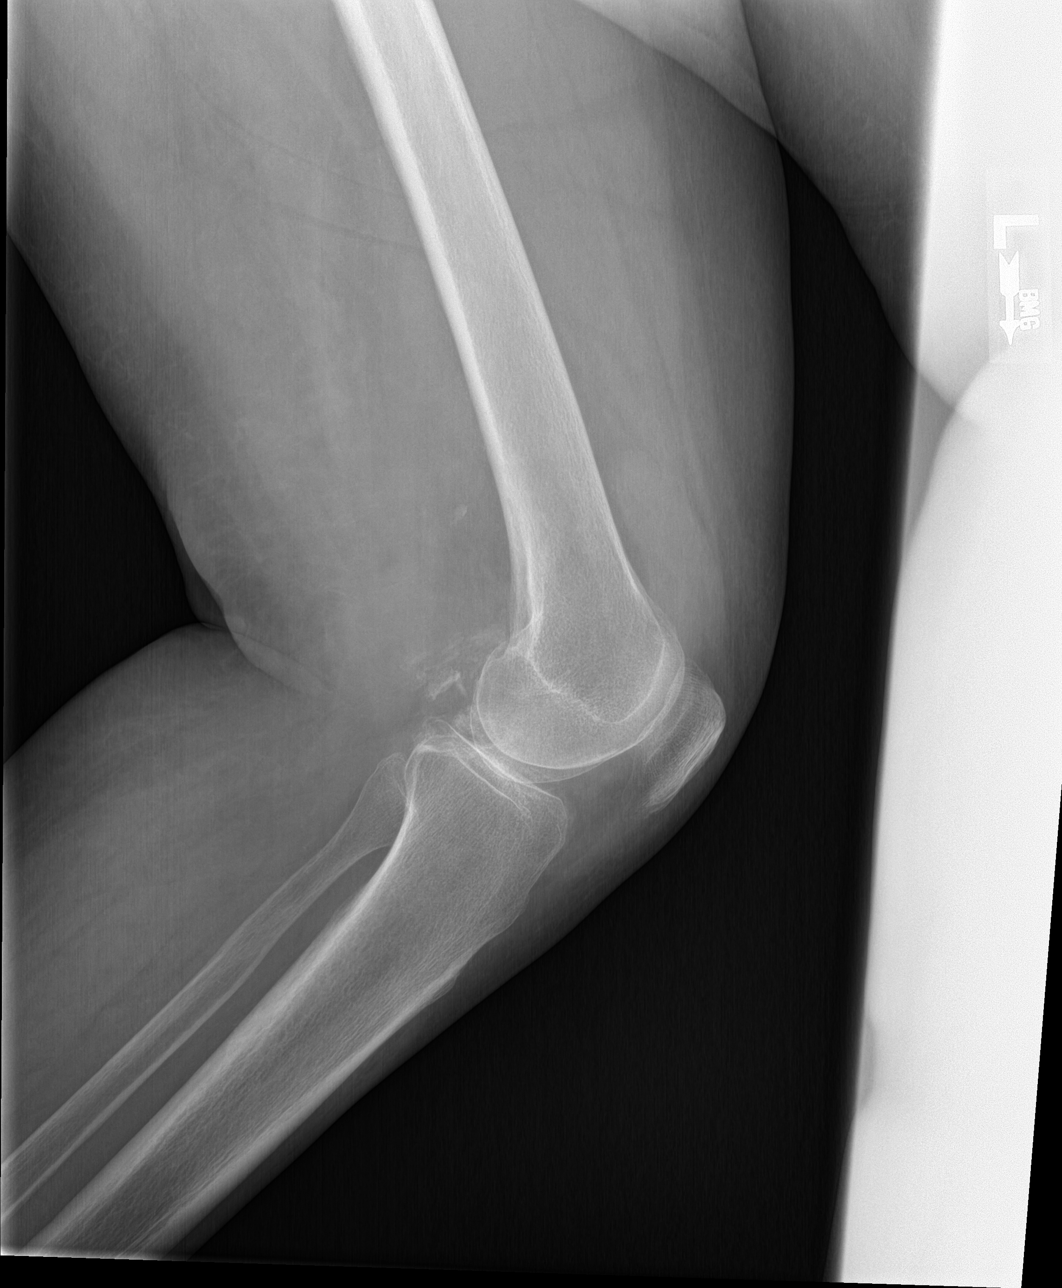

[sunrise]
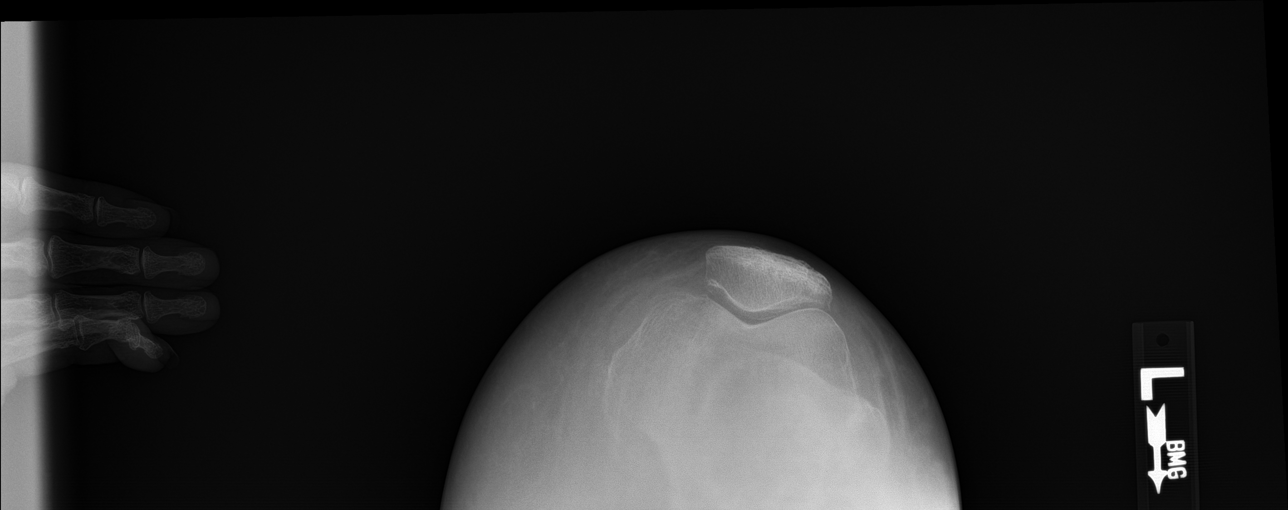

[knee [person_name]]
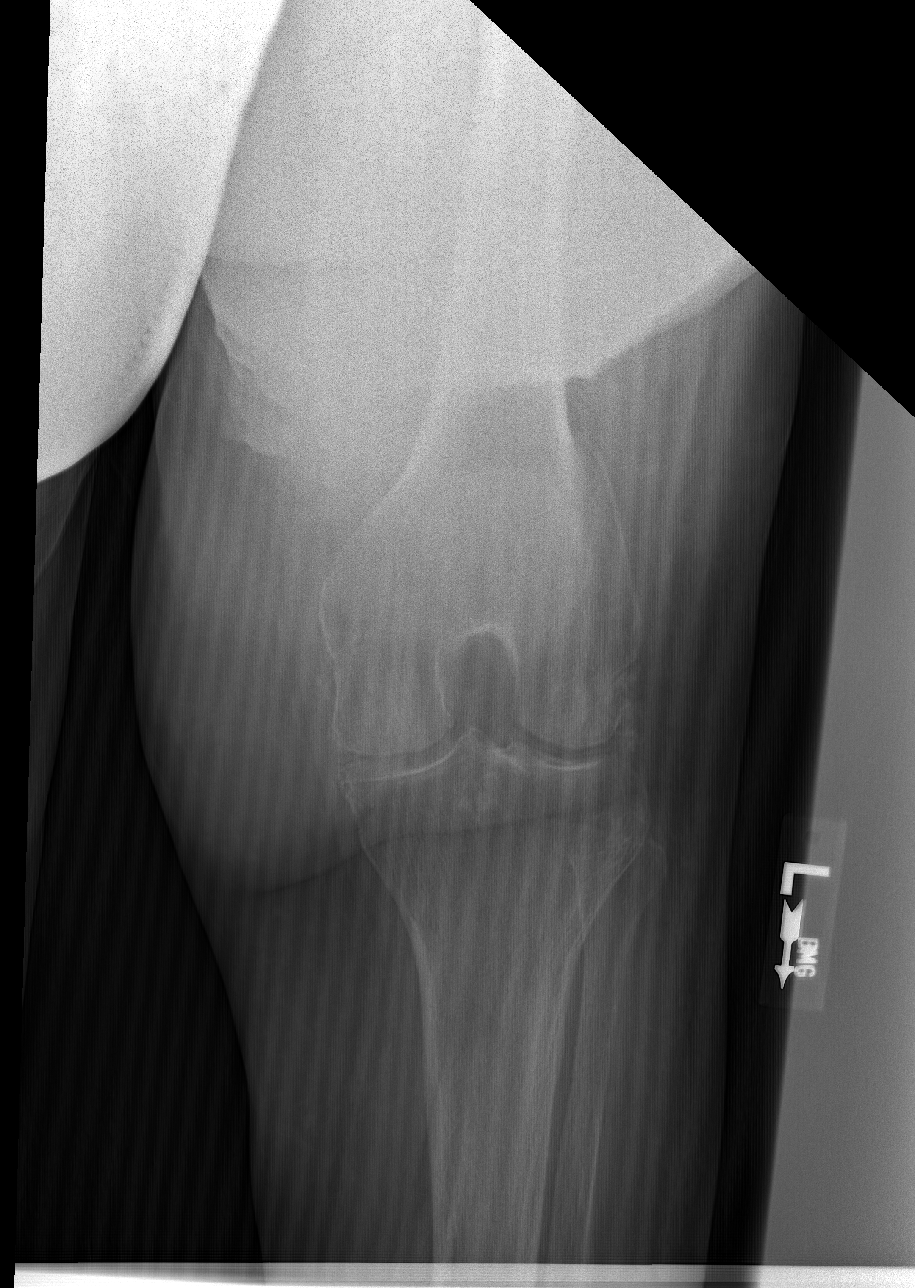

[4 of 4 positions shown; findings below may reference images not displayed]

FINDINGS: Tricompartmental osteoarthritis with small suprapatellar joint
effusion. Soft tissue calcifications posterior to the femoral
condyles may be related to gastrocnemius tendinopathy. There is mild
soft tissue fullness as well in the popliteal fossa. A popliteus
cyst is not entirely excluded. No acute fracture, intra-articular
loose body nor primary bone lesion. Chondrocalcinosis of hyaline
cartilage is noted.
IMPRESSION: 1. Soft tissue calcifications in the popliteal fossa may reflect
gastrocnemius tendinopathy or old remote injury.
2. Soft tissue fullness also noted in the popliteal fossa can be
seen with popliteal cysts. This can be further correlated with
ultrasound if clinically necessary.
3. Small suprapatellar joint effusion with tricompartmental
osteoarthritis of the knee.

## 2018-10-15 DIAGNOSIS — N952 Postmenopausal atrophic vaginitis: Secondary | ICD-10-CM | POA: Diagnosis not present

## 2018-10-15 DIAGNOSIS — R6889 Other general symptoms and signs: Secondary | ICD-10-CM | POA: Diagnosis not present

## 2018-10-15 DIAGNOSIS — N813 Complete uterovaginal prolapse: Secondary | ICD-10-CM | POA: Diagnosis not present

## 2018-10-15 DIAGNOSIS — Z4689 Encounter for fitting and adjustment of other specified devices: Secondary | ICD-10-CM | POA: Diagnosis not present

## 2018-10-27 ENCOUNTER — Other Ambulatory Visit: Payer: Self-pay

## 2018-10-27 MED ORDER — CARVEDILOL 12.5 MG PO TABS: ORAL_TABLET | ORAL | 0 refills | Status: DC

## 2018-11-21 ENCOUNTER — Other Ambulatory Visit: Payer: Self-pay | Admitting: Family Medicine

## 2018-11-21 NOTE — Telephone Encounter (Signed)
Requested medication (s) are due for refill today: yes  Requested medication (s) are on the active medication list: yes  Last refill:  10/27/18  #60  0 refills  Future visit scheduled: no  Notes to clinic:  Last order states pt need appointment and labs ASAP. Left message to call office for appointment.    Requested Prescriptions  Pending Prescriptions Disp Refills   carvedilol (COREG) 12.5 MG tablet [Pharmacy Med Name: CARVEDILOL 12.5 MG Tablet] 60 tablet 0    Sig: TAKE 1 TABLET TWICE DAILY WITH A MEAL (NEED APPOINTMENT AND LABS AS SOON AS POSSIBLE)     Cardiovascular:  Beta Blockers Passed - 11/21/2018  1:37 PM      Passed - Last BP in normal range    BP Readings from Last 1 Encounters:  08/07/18 139/69         Passed - Last Heart Rate in normal range    Pulse Readings from Last 1 Encounters:  08/07/18 (!) 48         Passed - Valid encounter within last 6 months    Recent Outpatient Visits          3 months ago Primary osteoarthritis of left shoulder   [facility] [place], [place]   8 months ago Type 2 diabetes mellitus with diabetic nephropathy, without long-term current use of insulin [facility])   Primary Care at [facility], Renette Butters, MD   1 year ago Swelling of wrist joint, right   Primary Care at [facility], Arlie Solomons, MD   1 year ago Encounter for Medicare annual wellness exam   [facility] at 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

## 2018-11-21 NOTE — Telephone Encounter (Signed)
Call placed to patient. Left VM to call office for appointment.

## 2018-11-27 ENCOUNTER — Other Ambulatory Visit: Payer: Self-pay | Admitting: Family Medicine

## 2018-12-15 ENCOUNTER — Ambulatory Visit (INDEPENDENT_AMBULATORY_CARE_PROVIDER_SITE_OTHER): Payer: Medicare HMO | Admitting: Emergency Medicine

## 2018-12-15 ENCOUNTER — Other Ambulatory Visit: Payer: Self-pay

## 2018-12-15 ENCOUNTER — Encounter: Payer: Self-pay | Admitting: Emergency Medicine

## 2018-12-15 ENCOUNTER — Ambulatory Visit: Payer: Medicare HMO | Admitting: Emergency Medicine

## 2018-12-15 VITALS — BP 128/56 | HR 53 | Temp 98.7°F | Resp 20 | Ht 61.18 in | Wt 140.6 lb

## 2018-12-15 DIAGNOSIS — N289 Disorder of kidney and ureter, unspecified: Secondary | ICD-10-CM

## 2018-12-15 DIAGNOSIS — E1121 Type 2 diabetes mellitus with diabetic nephropathy: Secondary | ICD-10-CM

## 2018-12-15 DIAGNOSIS — I10 Essential (primary) hypertension: Secondary | ICD-10-CM

## 2018-12-15 DIAGNOSIS — D649 Anemia, unspecified: Secondary | ICD-10-CM | POA: Diagnosis not present

## 2018-12-15 DIAGNOSIS — R6889 Other general symptoms and signs: Secondary | ICD-10-CM | POA: Diagnosis not present

## 2018-12-15 NOTE — Patient Instructions (Addendum)
   If you have lab work done today you will be contacted with your lab results within the next 2 weeks.  If you have not heard from us then please contact us. The fastest way to get your results is to register for My Chart.   IF you received an x-ray today, you will receive an invoice from [facility]. Please contact [facility] at [phone] with questions or concerns regarding your invoice.   IF you received labwork today, you will receive an invoice from [facility]. Please contact [facility] at [phone] with questions or concerns regarding your invoice.   Our billing staff will not be able to assist you with questions regarding bills from these companies.  You will be contacted with the lab results as soon as they are available. The fastest way to get your results is to activate your My Chart account. Instructions are located on the last page of this paperwork. If you have not heard from us regarding the results in 2 weeks, please contact this office.       Hypertension Hypertension, commonly called high blood pressure, is when the force of blood pumping through the arteries is too strong. The arteries are the blood vessels that carry blood from the heart throughout the body. Hypertension forces the heart to work harder to pump blood and may cause arteries to become narrow or stiff. Having untreated or uncontrolled hypertension can cause heart attacks, strokes, kidney disease, and other problems. A blood pressure reading consists of a higher number over a lower number. Ideally, your blood pressure should be below 120/80. The first ("top") number is called the systolic pressure. It is a measure of the pressure in your arteries as your heart beats. The second ("bottom") number is called the diastolic pressure. It is a measure of the pressure in your arteries as the heart relaxes. What are the causes? The cause of this condition is not known. What increases the  risk? Some risk factors for high blood pressure are under your control. Others are not. Factors you can change  Smoking.  Having type 2 diabetes mellitus, high cholesterol, or both.  Not getting enough exercise or physical activity.  Being overweight.  Having too much fat, sugar, calories, or salt (sodium) in your diet.  Drinking too much alcohol. Factors that are difficult or impossible to change  Having chronic kidney disease.  Having a family history of high blood pressure.  Age. Risk increases with age.  Race. You may be at higher risk if you are African-American.  Gender. Men are at higher risk than women before age 45. After age 65, women are at higher risk than men.  Having obstructive sleep apnea.  Stress. What are the signs or symptoms? Extremely high blood pressure (hypertensive crisis) may cause:  Headache.  Anxiety.  Shortness of breath.  Nosebleed.  Nausea and vomiting.  Severe chest pain.  Jerky movements you cannot control (seizures). How is this diagnosed? This condition is diagnosed by measuring your blood pressure while you are seated, with your arm resting on a surface. The cuff of the blood pressure monitor will be placed directly against the skin of your upper arm at the level of your heart. It should be measured at least twice using the same arm. Certain conditions can cause a difference in blood pressure between your right and left arms. Certain factors can cause blood pressure readings to be lower or higher than normal (elevated) for a short period of time:    When your blood pressure is higher when you are in a health care provider's office than when you are at home, this is called white coat hypertension. Most people with this condition do not need medicines.  When your blood pressure is higher at home than when you are in a health care provider's office, this is called masked hypertension. Most people with this condition may need medicines  to control blood pressure. If you have a high blood pressure reading during one visit or you have normal blood pressure with other risk factors:  You may be asked to return on a different day to have your blood pressure checked again.  You may be asked to monitor your blood pressure at home for 1 week or longer. If you are diagnosed with hypertension, you may have other blood or imaging tests to help your health care provider understand your overall risk for other conditions. How is this treated? This condition is treated by making healthy lifestyle changes, such as eating healthy foods, exercising more, and reducing your alcohol intake. Your health care provider may prescribe medicine if lifestyle changes are not enough to get your blood pressure under control, and if:  Your systolic blood pressure is above 130.  Your diastolic blood pressure is above 80. Your personal target blood pressure may vary depending on your medical conditions, your age, and other factors. Follow these instructions at home: Eating and drinking   Eat a diet that is high in fiber and potassium, and low in sodium, added sugar, and fat. An example eating plan is called the DASH (Dietary Approaches to Stop Hypertension) diet. To eat this way: ? Eat plenty of fresh fruits and vegetables. Try to fill half of your plate at each meal with fruits and vegetables. ? Eat whole grains, such as whole wheat pasta, brown rice, or whole grain bread. Fill about one quarter of your plate with whole grains. ? Eat or drink low-fat dairy products, such as skim milk or low-fat yogurt. ? Avoid fatty cuts of meat, processed or cured meats, and poultry with skin. Fill about one quarter of your plate with lean proteins, such as fish, chicken without skin, beans, eggs, and tofu. ? Avoid premade and processed foods. These tend to be higher in sodium, added sugar, and fat.  Reduce your daily sodium intake. Most people with hypertension should  eat less than 1,500 mg of sodium a day.  Limit alcohol intake to no more than 1 drink a day for nonpregnant women and 2 drinks a day for men. One drink equals 12 oz of beer, 5 oz of wine, or 1 oz of hard liquor. Lifestyle   Work with your health care provider to maintain a healthy body weight or to lose weight. Ask what an ideal weight is for you.  Get at least 30 minutes of exercise that causes your heart to beat faster (aerobic exercise) most days of the week. Activities may include walking, swimming, or biking.  Include exercise to strengthen your muscles (resistance exercise), such as pilates or lifting weights, as part of your weekly exercise routine. Try to do these types of exercises for 30 minutes at least 3 days a week.  Do not use any products that contain nicotine or tobacco, such as cigarettes and e-cigarettes. If you need help quitting, ask your health care provider.  Monitor your blood pressure at home as told by your health care provider.  Keep all follow-up visits as told by your health care provider.   This is important. Medicines  Take over-the-counter and prescription medicines only as told by your health care provider. Follow directions carefully. Blood pressure medicines must be taken as prescribed.  Do not skip doses of blood pressure medicine. Doing this puts you at risk for problems and can make the medicine less effective.  Ask your health care provider about side effects or reactions to medicines that you should watch for. Contact a health care provider if:  You think you are having a reaction to a medicine you are taking.  You have headaches that keep coming back (recurring).  You feel dizzy.  You have swelling in your ankles.  You have trouble with your vision. Get help right away if:  You develop a severe headache or confusion.  You have unusual weakness or numbness.  You feel faint.  You have severe pain in your chest or abdomen.  You vomit  repeatedly.  You have trouble breathing. Summary  Hypertension is when the force of blood pumping through your arteries is too strong. If this condition is not controlled, it may put you at risk for serious complications.  Your personal target blood pressure may vary depending on your medical conditions, your age, and other factors. For most people, a normal blood pressure is less than 120/80.  Hypertension is treated with lifestyle changes, medicines, or a combination of both. Lifestyle changes include weight loss, eating a healthy, low-sodium diet, exercising more, and limiting alcohol. This information is not intended to replace advice given to you by your health care provider. Make sure you discuss any questions you have with your health care provider. Document Released: 11/12/2005 Document Revised: 10/10/2016 Document Reviewed: 10/10/2016 Elsevier Interactive Patient Education  2019 [employer]  

## 2018-12-15 NOTE — Progress Notes (Signed)
Leah Stafford 85 y.o.   Chief Complaint  Patient presents with  . Establish Care    HISTORY OF PRESENT ILLNESS: This is a 85 y.o. female here to establish care.  Has a history of hypertension.  Has no complaints or medical concerns today.  Lab Results  Component Value Date   HGBA1C 7.3 03/03/2018   BP Readings from Last 3 Encounters:  12/15/18 (!) 145/59  08/07/18 139/69  03/03/18 (!) 128/56   Wt Readings from Last 3 Encounters:  12/15/18 140 lb 9.6 oz (63.8 kg)  08/07/18 151 lb (68.5 kg)  03/03/18 157 lb (71.2 kg)    HPI   Prior to Admission medications   Medication Sig Start Date End Date Taking? Authorizing Provider  acetaminophen (TYLENOL) 500 MG tablet Take 2 tablets (1,000 mg total) every 8 (eight) hours as needed by mouth for moderate pain. 10/07/17  Yes [name], MD  aspirin 81 MG tablet Take 81 mg by mouth daily.    Yes [provider]  carvedilol (COREG) 12.5 MG tablet TAKE 1 TABLET TWICE DAILY WITH A MEAL 10/27/18  Yes Stallings, Zoe A, MD  Cholecalciferol (VITAMIN D PO) Take 5,000 Units by mouth daily. OTC, strength not known    Yes [provider]  diclofenac sodium (VOLTAREN) 1 % GEL Apply 4 g 4 (four) times daily topically. 10/07/17  Yes Stallings, Zoe A, MD  DUREZOL 0.05 % EMUL Inject 1 drop into the eye daily. 12/06/16  Yes [provider]  Elastic Bandages & Supports (BACK SUPPORT) [place]  11/11/17  Yes [provider]  Incontinence Supply Disposable (ASSURANCE UNDERWEAR WOMENS) MISC  11/11/17  Yes [provider]  lisinopril-hydrochlorothiazide (PRINZIDE,ZESTORETIC) 20-12.5 MG tablet TAKE 1 TABLET EVERY DAY (NEED MD APPOINTMENT) 11/28/18  Yes Delia Chimes A, MD  mirtazapine (REMERON) 7.5 MG tablet TAKE 1 TABLET AT BEDTIME 08/31/18  Yes [name], MD  Misc. Devices (COMMODE BEDSIDE) MISC One bedside commode 3 in 1  Dx: osteoarthritis knee 08/13/17  Yes Wardell Honour, MD  Misc. Devices (RAISED TOILET  SEAT) MISC For use to prevent falls 05/28/17  Yes [name], MD  Misc. Devices [facility]) MISC Use walker to prevent falls 05/28/17  Yes Delia Chimes A, MD  Multiple Vitamin (MULTIVITAMIN WITH MINERALS) TABS tablet Take 1 tablet by mouth daily.   Yes [provider]  pravastatin (PRAVACHOL) 10 MG tablet TAKE 1 TABLET EVERY DAY 06/20/18  Yes Wardell Honour, MD  vitamin C (ASCORBIC ACID) 500 MG tablet Take 500 mg by mouth daily.   Yes [provider]    Allergies  Allergen Reactions  . Codeine Nausea And Vomiting    Patient Active Problem List   Diagnosis Date Noted  . Reactive depression 09/25/2017  . Primary osteoarthritis of left knee 07/29/2017  . Primary osteoarthritis of left shoulder 07/29/2017  . 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 Pure hypercholesterolemia 02/12/2017  . Vitreous hemorrhage of right eye ([place]) 02/07/2016  . Left posterior capsular opacification 08/02/2015  . Pseudophakia of both eyes 08/02/2015  . Renal insufficiency 09/16/2013  . 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 Hypertension 03/30/2013    Past Medical History:  Diagnosis Date  . Arthritis   . Cataract   . Depression   . Diabetes mellitus without complication (HCC)    Diet and excercise  . Diabetic retinopathy ([place])   .  High cholesterol   . Hypertension     Past Surgical History:  Procedure Laterality Date  . EYE SURGERY    . TUBAL LIGATION      Social History   Socioeconomic History  . Marital status: Widowed    Spouse name: Not on file  . Number of children: 3  . Years of education: Not on file  . Highest education level: Not on file  Occupational History  . Occupation: retired  Scientific laboratory technician  . Financial resource strain: Not on file  . Food insecurity:    Worry: Not on file    Inability: Not on file  . Transportation needs:    Medical:  Not on file    Non-medical: Not on file  Tobacco Use  . Smoking status: Never Smoker  . Smokeless tobacco: Former Systems developer    Types: Snuff  Substance and Sexual Activity  . Alcohol use: No  . Drug use: No  . Sexual activity: Not Currently  Lifestyle  . Physical activity:    Days per week: Not on file    Minutes per session: Not on file  . Stress: Not on file  Relationships  . Social connections:    Talks on phone: Not on file    Gets together: Not on file    Attends religious service: Not on file    Active member of club or organization: Not on file    Attends meetings of clubs or organizations: Not on file    Relationship status: Not on file  . Intimate partner violence:    Fear of current or ex partner: Not on file    Emotionally abused: Not on file    Physically abused: Not on file    Forced sexual activity: Not on file  Other Topics Concern  . Not on file  Social History Narrative   Marital status: widowed; not dating in 2018      Children: 3 children; 9 grandchildren; 8 gg.      Lives: alone; daughter five minutes per day.      Employment: retired.       Tobacco: none      Alcohol: none      Exercise: previous walking; no walking during winter months.      ADLs: no driving.  Cleans, washes clothes; cleans house; pays bills.  Grocery shopping with daughter.  No assistant devices in 2018.       Advanced Directives:  None;  FULL CODE; daughter HCPOA.          Family History  Problem Relation Age of Onset  . Cancer Father   . Hyperlipidemia Sister   . Hypertension Sister   . Hypertension Brother   . Hyperlipidemia Brother   . Heart disease Brother      Review of Systems  Constitutional: Negative.  Negative for chills and fever.  HENT: Negative.  Negative for sore throat.   Eyes: Negative.  Negative for blurred vision and double vision.  Respiratory: Negative.  Negative for cough and shortness of breath.   Cardiovascular: Negative.  Negative for chest pain and  palpitations.  Gastrointestinal: Negative.  Negative for abdominal pain, blood in stool, diarrhea, melena, nausea and vomiting.  Genitourinary: Negative.  Negative for dysuria and hematuria.  Musculoskeletal: Negative.  Negative for back pain, myalgias and neck pain.  Skin: Negative.  Negative for rash.  Neurological: Negative.  Negative for dizziness and headaches.  Endo/Heme/Allergies: Negative.   All other systems reviewed and are negative.  Vitals:   12/15/18 1438 12/15/18 1457  BP: (!) 145/59 (!) 128/56  Pulse: (!) 53   Resp: 20   Temp: 98.7 F (37.1 C)   SpO2: 100%     Physical Exam Vitals signs reviewed.  Constitutional:      Appearance: Normal appearance.  HENT:     Head: Normocephalic and atraumatic.     Nose: Nose normal.     Mouth/Throat:     Mouth: Mucous membranes are moist.     Pharynx: Oropharynx is clear.  Eyes:     Extraocular Movements: Extraocular movements intact.     Conjunctiva/sclera: Conjunctivae normal.     Pupils: Pupils are equal, round, and reactive to light.  Neck:     Musculoskeletal: Normal range of motion and neck supple.  Cardiovascular:     Rate and Rhythm: Normal rate.     Heart sounds: Normal heart sounds.  Pulmonary:     Effort: Pulmonary effort is normal.     Breath sounds: Normal breath sounds.  Abdominal:     General: There is no distension.     Palpations: Abdomen is soft.     Tenderness: There is no abdominal tenderness.  Musculoskeletal: Normal range of motion.        General: No swelling or tenderness.     Right lower leg: No edema.     Left lower leg: No edema.  Skin:    General: Skin is warm and dry.     Capillary Refill: Capillary refill takes less than 2 seconds.  Neurological:     General: No focal deficit present.     Mental Status: She is alert and oriented to person, place, and time.  Psychiatric:        Mood and Affect: Mood normal.        Behavior: Behavior normal.    A total of 25 minutes was spent in  the room with the patient, greater than 50% of which was in counseling/coordination of care regarding chronic medical problems, medications, nutrition, and need for follow-up.   ASSESSMENT & PLAN: Jiselle was seen today for establish care.  Diagnoses and all orders for this visit:  Essential hypertension  Type 2 diabetes mellitus with diabetic nephropathy, without long-term current use of insulin (HCC) -     Hemoglobin A1c  Renal insufficiency -     Comprehensive metabolic panel  Anemia, unspecified type -     CBC with Differential/Platelet    Patient Instructions       If you have lab work done today you will be contacted with your lab results within the next 2 weeks.  If you have not heard from [place] then please contact us. The fastest way to get your results is to register for My Chart.   IF you received an x-ray today, you will receive an invoice from [facility]. Please contact [facility] at [phone] with questions or concerns regarding your invoice.   IF you received labwork today, you will receive an invoice from [facility]. Please contact [facility] at [phone] with questions or concerns regarding your invoice.   Our billing staff will not be able to assist you with questions regarding bills from these companies.  You will be contacted with the lab results as soon as they are available. The fastest way to get your results is to activate your My Chart account. Instructions are located on the last page of this paperwork. If you have not heard from [place] regarding the results in  2 weeks, please contact this office.     Hypertension Hypertension, commonly called high blood pressure, is when the force of blood pumping through the arteries is too strong. The arteries are the blood vessels that carry blood from the heart throughout the body. Hypertension forces the heart to work harder to pump blood and may cause arteries to become narrow or stiff. Having  untreated or uncontrolled hypertension can cause heart attacks, strokes, kidney disease, and other problems. A blood pressure reading consists of a higher number over a lower number. Ideally, your blood pressure should be below 120/80. The first ("top") number is called the systolic pressure. It is a measure of the pressure in your arteries as your heart beats. The second ("bottom") number is called the diastolic pressure. It is a measure of the pressure in your arteries as the heart relaxes. What are the causes? The cause of this condition is not known. What increases the risk? Some risk factors for high blood pressure are under your control. Others are not. Factors you can change  Smoking.  Having type 2 diabetes mellitus, high cholesterol, or both.  Not getting enough exercise or physical activity.  Being overweight.  Having too much fat, sugar, calories, or salt (sodium) in your diet.  Drinking too much alcohol. Factors that are difficult or impossible to change  Having chronic kidney disease.  Having a family history of high blood pressure.  Age. Risk increases with age.  Race. You may be at higher risk if you are African-American.  Gender. Men are at higher risk than women before age 43. After age 13, women are at higher risk than men.  Having obstructive sleep apnea.  Stress. What are the signs or symptoms? Extremely high blood pressure (hypertensive crisis) may cause:  Headache.  Anxiety.  Shortness of breath.  Nosebleed.  Nausea and vomiting.  Severe chest pain.  Jerky movements you cannot control (seizures). How is this diagnosed? This condition is diagnosed by measuring your blood pressure while you are seated, with your arm resting on a surface. The cuff of the blood pressure monitor will be placed directly against the skin of your upper arm at the level of your heart. It should be measured at least twice using the same arm. Certain conditions can cause  a difference in blood pressure between your right and left arms. Certain factors can cause blood pressure readings to be lower or higher than normal (elevated) for a short period of time:  When your blood pressure is higher when you are in a health care provider's office than when you are at home, this is called white coat hypertension. Most people with this condition do not need medicines.  When your blood pressure is higher at home than when you are in a health care provider's office, this is called masked hypertension. Most people with this condition may need medicines to control blood pressure. If you have a high blood pressure reading during one visit or you have normal blood pressure with other risk factors:  You may be asked to return on a different day to have your blood pressure checked again.  You may be asked to monitor your blood pressure at home for 1 week or longer. If you are diagnosed with hypertension, you may have other blood or imaging tests to help your health care provider understand your overall risk for other conditions. How is this treated? This condition is treated by making healthy lifestyle changes, such as eating healthy foods,  exercising more, and reducing your alcohol intake. Your health care provider may prescribe medicine if lifestyle changes are not enough to get your blood pressure under control, and if:  Your systolic blood pressure is above 130.  Your diastolic blood pressure is above 80. Your personal target blood pressure may vary depending on your medical conditions, your age, and other factors. Follow these instructions at home: Eating and drinking   Eat a diet that is high in fiber and potassium, and low in sodium, added sugar, and fat. An example eating plan is called the DASH (Dietary Approaches to Stop Hypertension) diet. To eat this way: ? Eat plenty of fresh fruits and vegetables. Try to fill half of your plate at each meal with fruits and  vegetables. ? Eat whole grains, such as whole wheat pasta, brown rice, or whole grain bread. Fill about one quarter of your plate with whole grains. ? Eat or drink low-fat dairy products, such as skim milk or low-fat yogurt. ? Avoid fatty cuts of meat, processed or cured meats, and poultry with skin. Fill about one quarter of your plate with lean proteins, such as fish, chicken without skin, beans, eggs, and tofu. ? Avoid premade and processed foods. These tend to be higher in sodium, added sugar, and fat.  Reduce your daily sodium intake. Most people with hypertension should eat less than 1,500 mg of sodium a day.  Limit alcohol intake to no more than 1 drink a day for nonpregnant women and 2 drinks a day for men. One drink equals 12 oz of beer, 5 oz of wine, or 1 oz of hard liquor. Lifestyle   Work with your health care provider to maintain a healthy body weight or to lose weight. Ask what an ideal weight is for you.  Get at least 30 minutes of exercise that causes your heart to beat faster (aerobic exercise) most days of the week. Activities may include walking, swimming, or biking.  Include exercise to strengthen your muscles (resistance exercise), such as pilates or lifting weights, as part of your weekly exercise routine. Try to do these types of exercises for 30 minutes at least 3 days a week.  Do not use any products that contain nicotine or tobacco, such as cigarettes and e-cigarettes. If you need help quitting, ask your health care provider.  Monitor your blood pressure at home as told by your health care provider.  Keep all follow-up visits as told by your health care provider. This is important. Medicines  Take over-the-counter and prescription medicines only as told by your health care provider. Follow directions carefully. Blood pressure medicines must be taken as prescribed.  Do not skip doses of blood pressure medicine. Doing this puts you at risk for problems and can make  the medicine less effective.  Ask your health care provider about side effects or reactions to medicines that you should watch for. Contact a health care provider if:  You think you are having a reaction to a medicine you are taking.  You have headaches that keep coming back (recurring).  You feel dizzy.  You have swelling in your ankles.  You have trouble with your vision. Get help right away if:  You develop a severe headache or confusion.  You have unusual weakness or numbness.  You feel faint.  You have severe pain in your chest or abdomen.  You vomit repeatedly.  You have trouble breathing. Summary  Hypertension is when the force of blood pumping through your  arteries is too strong. If this condition is not controlled, it may put you at risk for serious complications.  Your personal target blood pressure may vary depending on your medical conditions, your age, and other factors. For most people, a normal blood pressure is less than 120/80.  Hypertension is treated with lifestyle changes, medicines, or a combination of both. Lifestyle changes include weight loss, eating a healthy, low-sodium diet, exercising more, and limiting alcohol. This information is not intended to replace advice given to you by your health care provider. Make sure you discuss any questions you have with your health care provider. Document Released: 11/12/2005 Document Revised: 10/10/2016 Document Reviewed: 10/10/2016 Elsevier Interactive Patient Education  2019 Elsevier Inc.      Agustina Caroli, MD [facility]

## 2018-12-16 LAB — CBC WITH DIFFERENTIAL/PLATELET
BASOS: 1 %
Basophils Absolute: 0.1 10*3/uL (ref 0.0–0.2)
EOS (ABSOLUTE): 0.2 10*3/uL (ref 0.0–0.4)
EOS: 4 %
Hematocrit: 32.7 % — ABNORMAL LOW (ref 34.0–46.6)
Hemoglobin: 11 g/dL — ABNORMAL LOW (ref 11.1–15.9)
IMMATURE GRANS (ABS): 0 10*3/uL (ref 0.0–0.1)
IMMATURE GRANULOCYTES: 0 %
LYMPHS: 24 %
Lymphocytes Absolute: 1.3 10*3/uL (ref 0.7–3.1)
MCH: 31.4 pg (ref 26.6–33.0)
MCHC: 33.6 g/dL (ref 31.5–35.7)
MCV: 93 fL (ref 79–97)
Monocytes Absolute: 0.7 10*3/uL (ref 0.1–0.9)
Monocytes: 13 %
NEUTROS PCT: 58 %
Neutrophils Absolute: 3 10*3/uL (ref 1.4–7.0)
PLATELETS: 336 10*3/uL (ref 150–450)
RBC: 3.5 x10E6/uL — AB (ref 3.77–5.28)
RDW: 12 % (ref 11.7–15.4)
WBC: 5.3 10*3/uL (ref 3.4–10.8)

## 2018-12-16 LAB — COMPREHENSIVE METABOLIC PANEL
ALBUMIN: 4.2 g/dL (ref 3.6–4.6)
ALT: 9 IU/L (ref 0–32)
AST: 15 IU/L (ref 0–40)
Albumin/Globulin Ratio: 1.4 (ref 1.2–2.2)
Alkaline Phosphatase: 85 IU/L (ref 39–117)
BUN / CREAT RATIO: 11 — AB (ref 12–28)
BUN: 15 mg/dL (ref 8–27)
Bilirubin Total: 0.7 mg/dL (ref 0.0–1.2)
CALCIUM: 10 mg/dL (ref 8.7–10.3)
CO2: 24 mmol/L (ref 20–29)
CREATININE: 1.33 mg/dL — AB (ref 0.57–1.00)
Chloride: 89 mmol/L — ABNORMAL LOW (ref 96–106)
GFR calc non Af Amer: 37 mL/min/{1.73_m2} — ABNORMAL LOW (ref >59)
GFR, EST AFRICAN AMERICAN: 43 mL/min/{1.73_m2} — AB (ref >59)
GLUCOSE: 122 mg/dL — AB (ref 65–99)
Globulin, Total: 3 g/dL (ref 1.5–4.5)
Potassium: 4 mmol/L (ref 3.5–5.2)
Sodium: 130 mmol/L — ABNORMAL LOW (ref 134–144)
TOTAL PROTEIN: 7.2 g/dL (ref 6.0–8.5)

## 2018-12-16 LAB — HEMOGLOBIN A1C
Est. average glucose Bld gHb Est-mCnc: 123 mg/dL
Hgb A1c MFr Bld: 5.9 % — ABNORMAL HIGH (ref 4.8–5.6)

## 2018-12-17 ENCOUNTER — Other Ambulatory Visit: Payer: Self-pay | Admitting: Family Medicine

## 2018-12-17 NOTE — Telephone Encounter (Signed)
Requested Prescriptions  Pending Prescriptions Disp Refills  . carvedilol (COREG) 12.5 MG tablet [Pharmacy Med Name: CARVEDILOL 12.5 MG Tablet] 60 tablet 3    Sig: TAKE 1 TABLET TWICE DAILY WITH A MEAL     Cardiovascular:  Beta Blockers Passed - 12/17/2018  4:05 PM      Passed - Last BP in normal range    BP Readings from Last 1 Encounters:  12/15/18 (!) 128/56         Passed - Last Heart Rate in normal range    Pulse Readings from Last 1 Encounters:  12/15/18 (!) 53         Passed - Valid encounter within last 6 months    Recent Outpatient Visits          2 days ago Essential hypertension   [facility] at [place], [place], MD   4 months ago Primary osteoarthritis of left shoulder   [facility] at [place], MD   9 months ago Type 2 diabetes mellitus with diabetic nephropathy, without long-term current use of insulin [facility])   Primary Care at [facility], Renette Butters, MD   1 year ago Swelling of wrist joint, right   [facility] at [facility], Arlie Solomons, MD   1 year ago Encounter for Medicare annual wellness exam   Primary Care at [facility], Renette Butters, MD      Future Appointments            In 3 months Sagardia, Ines Bloomer, MD Primary [facility] at [place], [facility]

## 2018-12-23 IMAGING — DX DG SHOULDER 2+V*L*
3 series · 3 of 3 positions shown · non-contrast
Comparison: None.

CLINICAL DATA: Left shoulder pain for 3 years, worsening. No known
injury.

EXAM:
LEFT SHOULDER - 2+ VIEW

[shoulder ap]
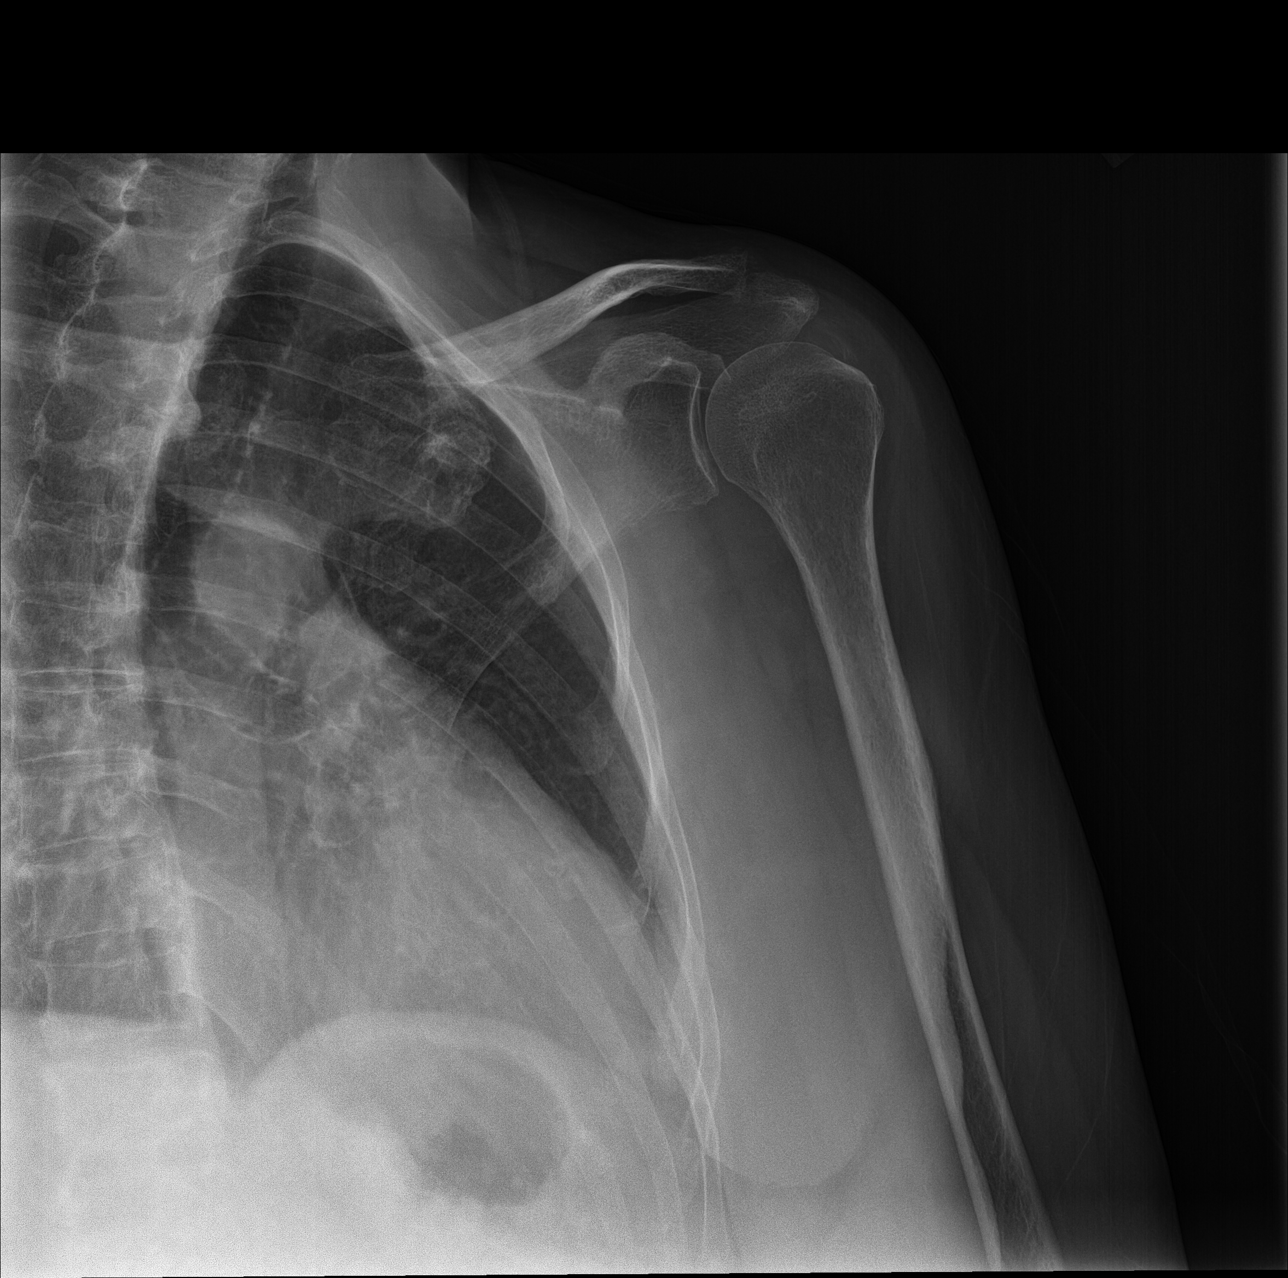

[shoulder y-view]
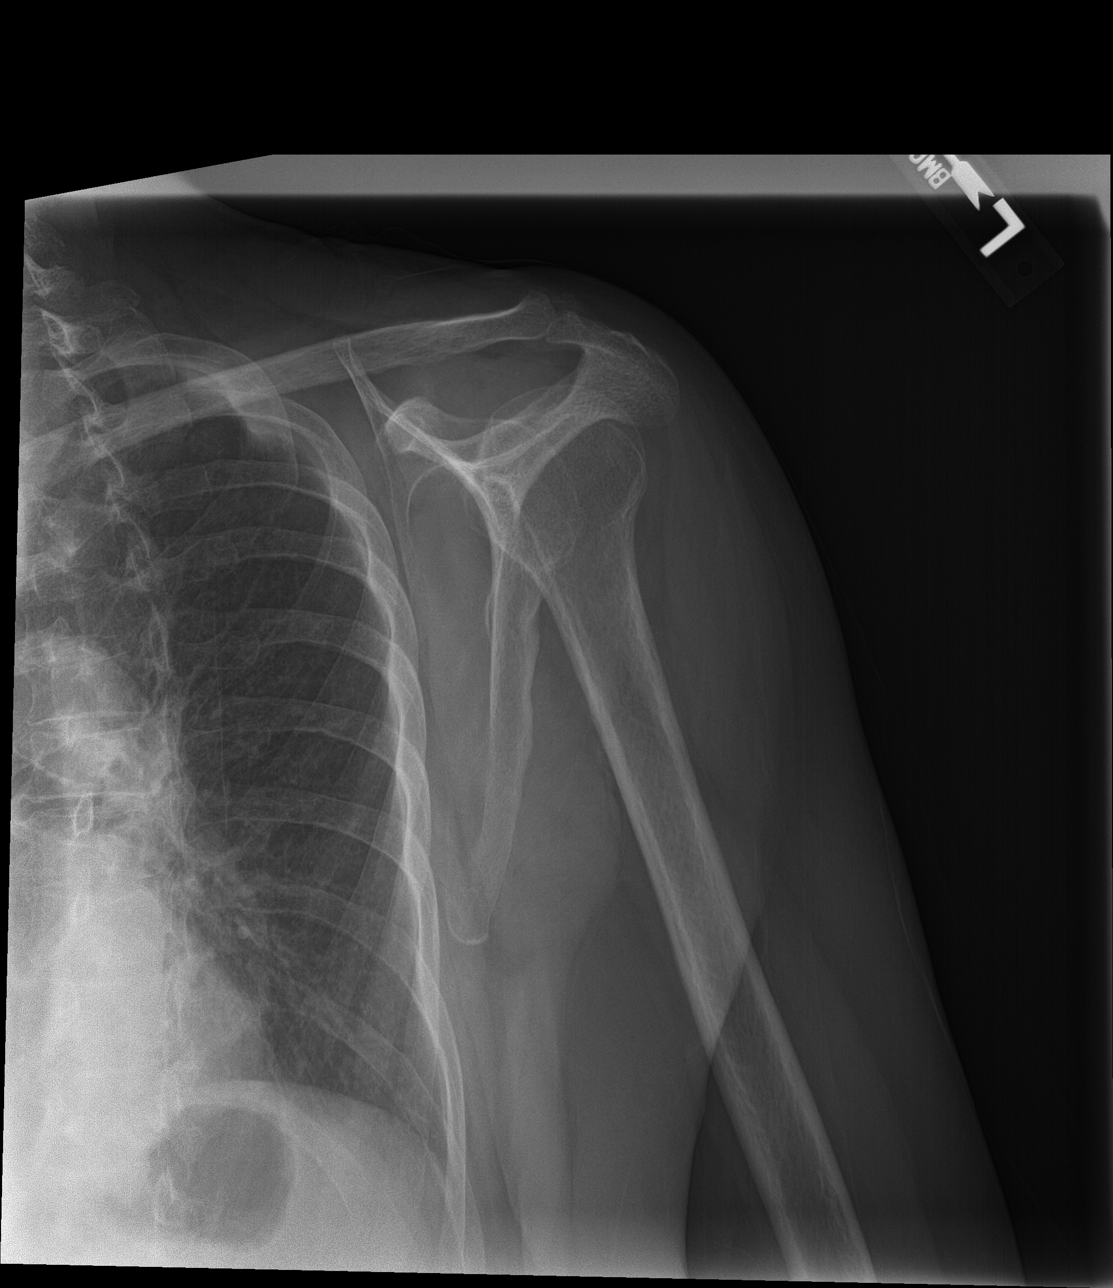

[shoulder axial]
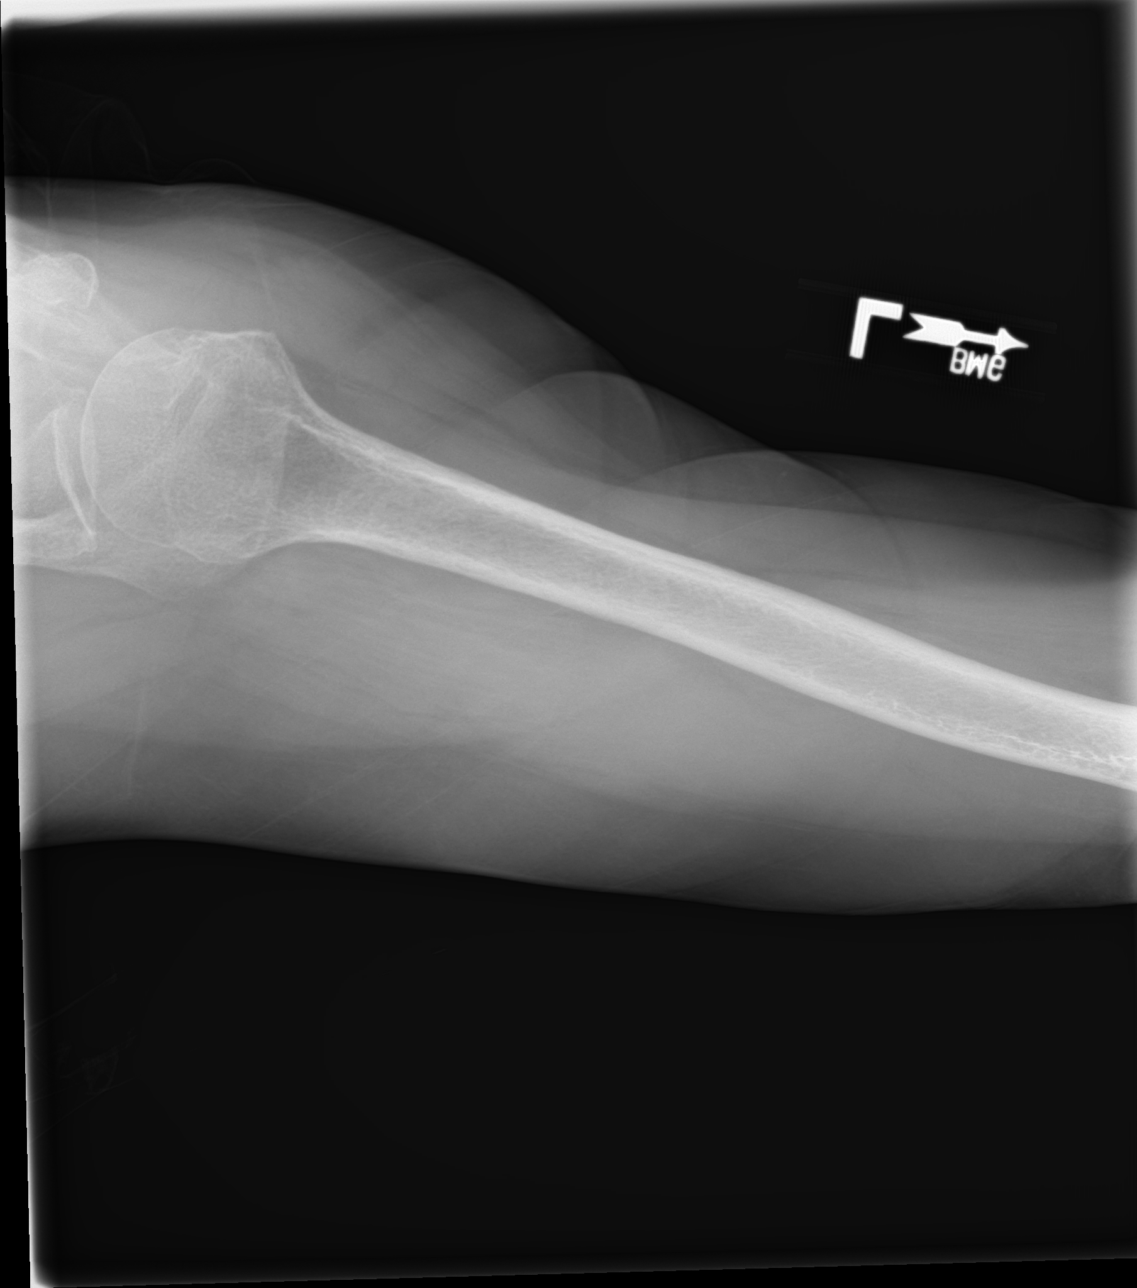

[3 of 3 positions shown; findings below may reference images not displayed]

FINDINGS: There is no acute bony or joint abnormality. Subtle calcifications
in the rotator cuff tendons at the greater tuberosity consistent
with tendinopathy noted. Moderate acromioclavicular osteoarthritis
is seen.
IMPRESSION: No acute abnormality.

Mild appearing calcific rotator cuff tendinopathy.

Moderate acromioclavicular osteoarthritis.

## 2018-12-24 ENCOUNTER — Ambulatory Visit: Payer: Medicare HMO | Admitting: Podiatry

## 2019-01-01 ENCOUNTER — Telehealth: Payer: Self-pay

## 2019-01-01 NOTE — Telephone Encounter (Signed)
Received request to call pt.  Call to pt to review labwork from 01/20 visit.  Advised that per provider labs are unremarkable.  Reviewed H&H.  Pt denies further questions at this time.

## 2019-01-13 DIAGNOSIS — R6889 Other general symptoms and signs: Secondary | ICD-10-CM | POA: Diagnosis not present

## 2019-01-13 DIAGNOSIS — H5210 Myopia, unspecified eye: Secondary | ICD-10-CM | POA: Diagnosis not present

## 2019-01-20 ENCOUNTER — Ambulatory Visit (INDEPENDENT_AMBULATORY_CARE_PROVIDER_SITE_OTHER): Payer: Medicare HMO | Admitting: Podiatry

## 2019-01-20 ENCOUNTER — Encounter: Payer: Self-pay | Admitting: Podiatry

## 2019-01-20 DIAGNOSIS — M79675 Pain in left toe(s): Secondary | ICD-10-CM | POA: Diagnosis not present

## 2019-01-20 DIAGNOSIS — E119 Type 2 diabetes mellitus without complications: Secondary | ICD-10-CM

## 2019-01-20 DIAGNOSIS — B351 Tinea unguium: Secondary | ICD-10-CM

## 2019-01-20 DIAGNOSIS — R6889 Other general symptoms and signs: Secondary | ICD-10-CM | POA: Diagnosis not present

## 2019-01-20 DIAGNOSIS — M79674 Pain in right toe(s): Secondary | ICD-10-CM

## 2019-01-20 NOTE — Progress Notes (Addendum)
Complaint:  Visit Type: Patient returns to my office for continued preventative foot care services. Complaint: Patient states" my nails have grown long and thick and become painful to walk and wear shoes" Patient has been diagnosed with DM with no foot complications. The patient presents for preventative foot care services. No changes to ROS.  Patient has not been seen in over 6 months.  Podiatric Exam: Vascular: dorsalis pedis and posterior tibial pulses are palpable bilateral. Capillary return is immediate. Temperature gradient is WNL. Skin turgor WNL  Sensorium: Normal Semmes Weinstein monofilament test. Normal tactile sensation bilaterally. Nail Exam: Pt has thick disfigured discolored nails with subungual debris noted bilateral entire nail hallux through fifth toenails Ulcer Exam: There is no evidence of ulcer or pre-ulcerative changes or infection. Orthopedic Exam: Muscle tone and strength are WNL. No limitations in general ROM. No crepitus or effusions noted. Foot type and digits show no abnormalities.  HAV  B/L. Skin: No Porokeratosis. No infection or ulcers  Diagnosis:  Onychomycosis, , Pain in right toe, pain in left toes  Treatment & Plan Procedures and Treatment: Consent by patient was obtained for treatment procedures.   Debridement of mycotic and hypertrophic toenails, 1 through 5 bilateral and clearing of subungual debris. No ulceration, no infection noted. Patient said she developed a severe pain in the tip of her fifth toe left foot.  I applied neosporin and band-aid.  I looked at the bandage and there was no blood.  She said there was still pain.  I suggested she soak in tepid water and soapy sudsy soak.  Ammie suggested I may have pinched her skin.  I do not know the reason for her pain.   Return Visit-Office Procedure: Patient instructed to return to the office for a follow up visit 3 months for continued evaluation and treatment.    Gardiner Barefoot DPM

## 2019-02-13 DIAGNOSIS — H35411 Lattice degeneration of retina, right eye: Secondary | ICD-10-CM | POA: Diagnosis not present

## 2019-02-13 DIAGNOSIS — R6889 Other general symptoms and signs: Secondary | ICD-10-CM | POA: Diagnosis not present

## 2019-02-13 DIAGNOSIS — E113513 Type 2 diabetes mellitus with proliferative diabetic retinopathy with macular edema, bilateral: Secondary | ICD-10-CM | POA: Diagnosis not present

## 2019-02-13 DIAGNOSIS — E113512 Type 2 diabetes mellitus with proliferative diabetic retinopathy with macular edema, left eye: Secondary | ICD-10-CM | POA: Diagnosis not present

## 2019-02-13 DIAGNOSIS — E113511 Type 2 diabetes mellitus with proliferative diabetic retinopathy with macular edema, right eye: Secondary | ICD-10-CM | POA: Diagnosis not present

## 2019-03-02 ENCOUNTER — Other Ambulatory Visit: Payer: Self-pay

## 2019-03-02 ENCOUNTER — Ambulatory Visit (INDEPENDENT_AMBULATORY_CARE_PROVIDER_SITE_OTHER): Payer: Medicare HMO | Admitting: Family Medicine

## 2019-03-02 DIAGNOSIS — Z0000 Encounter for general adult medical examination without abnormal findings: Secondary | ICD-10-CM

## 2019-03-02 NOTE — Patient Instructions (Signed)
Thank you for taking time to come for your Medicare Wellness Visit. I appreciate your ongoing commitment to your health goals. Please review the following plan we discussed and let me know if I can assist you in the future.  Leroy Kennedy LPN  Fall Prevention in the Home, Adult Falls can cause injuries. They can happen to people of all ages. There are many things you can do to make your home safe and to help prevent falls. Ask for help when making these changes, if needed. What actions can I take to prevent falls? General Instructions  Use good lighting in all rooms. Replace any light bulbs that burn out.  Turn on the lights when you go into a dark area. Use night-lights.  Keep items that you use often in easy-to-reach places. Lower the shelves around your home if necessary.  Set up your furniture so you have a clear path. Avoid moving your furniture around.  Do not have throw rugs and other things on the floor that can make you trip.  Avoid walking on wet floors.  If any of your floors are uneven, fix them.  Add color or contrast paint or tape to clearly mark and help you see: ? Any grab bars or handrails. ? First and last steps of stairways. ? Where the edge of each step is.  If you use a stepladder: ? Make sure that it is fully opened. Do not climb a closed stepladder. ? Make sure that both sides of the stepladder are locked into place. ? Ask someone to hold the stepladder for you while you use it.  If there are any pets around you, be aware of where they are. What can I do in the bathroom?      Keep the floor dry. Clean up any water that spills onto the floor as soon as it happens.  Remove soap buildup in the tub or shower regularly.  Use non-skid mats or decals on the floor of the tub or shower.  Attach bath mats securely with double-sided, non-slip rug tape.  If you need to sit down in the shower, use a plastic, non-slip stool.  Install grab bars by the toilet and  in the tub and shower. Do not use towel bars as grab bars. What can I do in the bedroom?  Make sure that you have a light by your bed that is easy to reach.  Do not use any sheets or blankets that are too big for your bed. They should not hang down onto the floor.  Have a firm chair that has side arms. You can use this for support while you get dressed. What can I do in the kitchen?  Clean up any spills right away.  If you need to reach something above you, use a strong step stool that has a grab bar.  Keep electrical cords out of the way.  Do not use floor polish or wax that makes floors slippery. If you must use wax, use non-skid floor wax. What can I do with my stairs?  Do not leave any items on the stairs.  Make sure that you have a light switch at the top of the stairs and the bottom of the stairs. If you do not have them, ask someone to add them for you.  Make sure that there are handrails on both sides of the stairs, and use them. Fix handrails that are broken or loose. Make sure that handrails are as long as the  stairways.  Install non-slip stair treads on all stairs in your home.  Avoid having throw rugs at the top or bottom of the stairs. If you do have throw rugs, attach them to the floor with carpet tape.  Choose a carpet that does not hide the edge of the steps on the stairway.  Check any carpeting to make sure that it is firmly attached to the stairs. Fix any carpet that is loose or worn. What can I do on the outside of my home?  Use bright outdoor lighting.  Regularly fix the edges of walkways and driveways and fix any cracks.  Remove anything that might make you trip as you walk through a door, such as a raised step or threshold.  Trim any bushes or trees on the path to your home.  Regularly check to see if handrails are loose or broken. Make sure that both sides of any steps have handrails.  Install guardrails along the edges of any raised decks and  porches.  Clear walking paths of anything that might make someone trip, such as tools or rocks.  Have any leaves, snow, or ice cleared regularly.  Use sand or salt on walking paths during winter.  Clean up any spills in your garage right away. This includes grease or oil spills. What other actions can I take?  Wear shoes that: ? Have a low heel. Do not wear high heels. ? Have rubber bottoms. ? Are comfortable and fit you well. ? Are closed at the toe. Do not wear open-toe sandals.  Use tools that help you move around (mobility aids) if they are needed. These include: ? Canes. ? Walkers. ? Scooters. ? Crutches.  Review your medicines with your doctor. Some medicines can make you feel dizzy. This can increase your chance of falling. Ask your doctor what other things you can do to help prevent falls. Where to find more information  Centers for Disease Control and Prevention, STEADI: https://garcia.biz/  [employer] on Aging: BrainJudge.co.uk Contact a doctor if:  You are afraid of falling at home.  You feel weak, drowsy, or dizzy at home.  You fall at home. Summary  There are many simple things that you can do to make your home safe and to help prevent falls.  Ways to make your home safe include removing tripping hazards and installing grab bars in the bathroom.  Ask for help when making these changes in your home. This information is not intended to replace advice given to you by your health care provider. Make sure you discuss any questions you have with your health care provider. Document Released: 09/08/2009 Document Revised: 06/27/2017 Document Reviewed: 06/27/2017 Elsevier Interactive Patient Education  2019 [employer] Directive  Advance directives are legal documents that let you make choices ahead of time about your health care and medical treatment in case you become unable to communicate for yourself. Advance directives are a way for  you to communicate your wishes to family, friends, and health care providers. This can help convey your decisions about end-of-life care if you become unable to communicate. Discussing and writing advance directives should happen over time rather than all at once. Advance directives can be changed depending on your situation and what you want, even after you have signed the advance directives. If you do not have an advance directive, some states assign family decision makers to act on your behalf based on how closely you are related to them. Each state has its own  laws regarding advance directives. You may want to check with your health care provider, attorney, or state representative about the laws in your state. There are different types of advance directives, such as:  Medical power of attorney.  Living will.  Do not resuscitate (DNR) or do not attempt resuscitation (DNAR) order. Health care proxy and medical power of attorney A health care proxy, also called a health care agent, is a person who is appointed to make medical decisions for you in cases in which you are unable to make the decisions yourself. Generally, people choose someone they know well and trust to represent their preferences. Make sure to ask this person for an agreement to act as your proxy. A proxy may have to exercise judgment in the event of a medical decision for which your wishes are not known. A medical power of attorney is a legal document that names your health care proxy. Depending on the laws in your state, after the document is written, it may also need to be:  Signed.  Notarized.  Dated.  Copied.  Witnessed.  Incorporated into your medical record. You may also want to appoint someone to manage your financial affairs in a situation in which you are unable to do so. This is called a durable power of attorney for finances. It is a separate legal document from the durable power of attorney for health care. You  may choose the same person or someone different from your health care proxy to act as your agent in financial matters. If you do not appoint a proxy, or if there is a concern that the proxy is not acting in your best interests, a court-appointed guardian may be designated to act on your behalf. Living will A living will is a set of instructions documenting your wishes about medical care when you cannot express them yourself. Health care providers should keep a copy of your living will in your medical record. You may want to give a copy to family members or friends. To alert caregivers in case of an emergency, you can place a card in your wallet to let them know that you have a living will and where they can find it. A living will is used if you become:  Terminally ill.  Incapacitated.  Unable to communicate or make decisions. Items to consider in your living will include:  The use or non-use of life-sustaining equipment, such as dialysis machines and breathing machines (ventilators).  A DNR or DNAR order, which is the instruction not to use cardiopulmonary resuscitation (CPR) if breathing or heartbeat stops.  The use or non-use of tube feeding.  Withholding of food and fluids.  Comfort (palliative) care when the goal becomes comfort rather than a cure.  Organ and tissue donation. A living will does not give instructions for distributing your money and property if you should pass away. It is recommended that you seek the advice of a lawyer when writing a will. Decisions about taxes, beneficiaries, and asset distribution will be legally binding. This process can relieve your family and friends of any concerns surrounding disputes or questions that may come up about the distribution of your assets. DNR or DNAR A DNR or DNAR order is a request not to have CPR in the event that your heart stops beating or you stop breathing. If a DNR or DNAR order has not been made and shared, a health care  provider will try to help any patient whose heart has stopped or who  has stopped breathing. If you plan to have surgery, talk with your health care provider about how your DNR or DNAR order will be followed if problems occur. Summary  Advance directives are the legal documents that allow you to make choices ahead of time about your health care and medical treatment in case you become unable to communicate for yourself.  The process of discussing and writing advance directives should happen over time. You can change the advance directives, even after you have signed them.  Advance directives include DNR or DNAR orders, living wills, and designating an agent as your medical power of attorney. This information is not intended to replace advice given to you by your health care provider. Make sure you discuss any questions you have with your health care provider. Document Released: 02/19/2008 Document Revised: 10/01/2016 Document Reviewed: 10/01/2016 Elsevier Interactive Patient Education  2019 [employer].

## 2019-03-02 NOTE — Progress Notes (Signed)
Presents today for [employer] Annual Wellness Visit      Patient Care Team: [name], MD as PCP - General (Internal Medicine)  Dr. Katy Fitch Opthalmology     Immunization status:  Immunization History  Administered Date(s) Administered  . Pneumococcal Conjugate-13 11/17/2014  . Pneumococcal Polysaccharide-23 09/18/2013       Functional Status Survey: Is the patient deaf or have difficulty hearing?: No Does the patient have difficulty seeing, even when wearing glasses/contacts?: No Does the patient have difficulty concentrating, remembering, or making decisions?: No Does the patient have difficulty walking or climbing stairs?: No Does the patient have difficulty dressing or bathing?: No Does the patient have difficulty doing errands alone such as visiting a doctor's office or shopping?: No   6CIT Screen 03/02/2019  What Year? 0 points  What month? 0 points  What time? 0 points  Count back from 20 0 points  Months in reverse 0 points  Repeat phrase 0 points  Total Score 0        Clinical Support from 03/02/2019 in [facility] at [facility]  AUDIT-C Score  0       Patient Active Problem List   Diagnosis Date Noted  . Reactive depression 09/25/2017  . Primary osteoarthritis of left knee 07/29/2017  . Primary osteoarthritis of left shoulder 07/29/2017  . 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 Pure hypercholesterolemia 02/12/2017  . Vitreous hemorrhage of right eye ([place]) 02/07/2016  . Left posterior capsular opacification 08/02/2015  . Pseudophakia of both eyes 08/02/2015  . Renal insufficiency 09/16/2013  . 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 Hypertension 03/30/2013     Past Medical History:  Diagnosis Date  . Arthritis   . Cataract   . Depression   . Diabetes mellitus without complication (HCC)    Diet and excercise  .  Diabetic retinopathy ([place])   . High cholesterol   . Hypertension      Past Surgical History:  Procedure Laterality Date  . EYE SURGERY    . TUBAL LIGATION       Family History  Problem Relation Age of Onset  . Cancer Father   . Hyperlipidemia Sister   . Hypertension Sister   . Hypertension Brother   . Hyperlipidemia Brother   . Heart disease Brother      Social History   Socioeconomic History  . Marital status: Widowed    Spouse name: Not on file  . Number of children: 3  . Years of education: Not on file  . Highest education level: Not on file  Occupational History  . Occupation: retired  Scientific laboratory technician  . Financial resource strain: Not on file  . Food insecurity:    Worry: Not on file    Inability: Not on file  . Transportation needs:    Medical: Not on file    Non-medical: Not on file  Tobacco Use  . Smoking status: Never Smoker  . Smokeless tobacco: Former Systems developer    Types: Snuff  Substance and Sexual Activity  . Alcohol use: No  . Drug use: No  . Sexual activity: Not Currently  Lifestyle  . Physical activity:    Days per week: Not on file    Minutes per session: Not on file  . Stress: Not on file  Relationships  . Social connections:    Talks on phone: Not on file  Gets together: Not on file    Attends religious service: Not on file    Active member of club or organization: Not on file    Attends meetings of clubs or organizations: Not on file    Relationship status: Not on file  . Intimate partner violence:    Fear of current or ex partner: Not on file    Emotionally abused: Not on file    Physically abused: Not on file    Forced sexual activity: Not on file  Other Topics Concern  . Not on file  Social History Narrative   Marital status: widowed; not dating in 2018      Children: 3 children; 9 grandchildren; 8 gg.      Lives: alone; daughter five minutes per day.      Employment: retired.       Tobacco: none      Alcohol: none       Exercise: previous walking; no walking during winter months.      ADLs: no driving.  Cleans, washes clothes; cleans house; pays bills.  Grocery shopping with daughter.  No assistant devices in 2018.       Advanced Directives:  None;  FULL CODE; daughter HCPOA.           Allergies  Allergen Reactions  . Codeine Nausea And Vomiting     Prior to Admission medications   Medication Sig Start Date End Date Taking? Authorizing Provider  acetaminophen (TYLENOL) 500 MG tablet Take 2 tablets (1,000 mg total) every 8 (eight) hours as needed by mouth for moderate pain. 10/07/17  Yes Stallings, Zoe A, MD  carvedilol (COREG) 12.5 MG tablet TAKE 1 TABLET TWICE DAILY WITH A MEAL 12/17/18  Yes Stallings, Zoe A, MD  Cholecalciferol (VITAMIN D PO) Take 5,000 Units by mouth daily. OTC, strength not known    Yes [provider]  DUREZOL 0.05 % EMUL Inject 1 drop into the eye daily. 12/06/16  Yes [provider]  Elastic Bandages & Supports (BACK SUPPORT) [place]  11/11/17  Yes [provider]  Incontinence Supply Disposable (ASSURANCE UNDERWEAR WOMENS) MISC  11/11/17  Yes [provider]  lisinopril-hydrochlorothiazide (PRINZIDE,ZESTORETIC) 20-12.5 MG tablet TAKE 1 TABLET EVERY DAY (NEED MD APPOINTMENT) 11/28/18  Yes [name], MD  Multiple Vitamin (MULTIVITAMIN WITH MINERALS) TABS tablet Take 1 tablet by mouth daily.   Yes [provider]  vitamin C (ASCORBIC ACID) 500 MG tablet Take 500 mg by mouth daily.   Yes [provider]  aspirin 81 MG tablet Take 81 mg by mouth daily.     [provider]  diclofenac sodium (VOLTAREN) 1 % GEL Apply 4 g 4 (four) times daily topically. Patient not taking: Reported on 03/02/2019 10/07/17   [name], MD  mirtazapine (REMERON) 7.5 MG tablet TAKE 1 TABLET AT BEDTIME Patient not taking: Reported on 03/02/2019 08/31/18   [name], MD  Misc. Devices (COMMODE BEDSIDE) MISC One bedside commode 3 in 1  Dx:  osteoarthritis knee Patient not taking: Reported on 03/02/2019 08/13/17   Wardell Honour, MD  Misc. Devices (RAISED TOILET SEAT) MISC For use to prevent falls Patient not taking: Reported on 03/02/2019 05/28/17   [name], MD  Misc. Devices [facility]) MISC Use walker to prevent falls Patient not taking: Reported on 03/02/2019 05/28/17   [name], MD  pravastatin (PRAVACHOL) 10 MG tablet TAKE 1 TABLET EVERY DAY Patient not taking: Reported on 03/02/2019 06/20/18  Wardell Honour, MD     Depression screen [facility] 2/9 03/02/2019 12/15/2018 08/07/2018 03/03/2018 10/07/2017  Decreased Interest 0 0 0 0 0  Down, Depressed, Hopeless 0 0 0 0 0  PHQ - 2 Score 0 0 0 0 0     Fall Risk  03/02/2019 12/15/2018 08/07/2018 03/03/2018 10/07/2017  Falls in the past year? 0 0 No No No  Number falls in past yr: 0 - - - -  Injury with Fall? 0 - - - -      PHYSICAL EXAM: There were no vitals taken for this visit.   Wt Readings from Last 3 Encounters:  12/15/18 140 lb 9.6 oz (63.8 kg)  08/07/18 151 lb (68.5 kg)  03/03/18 157 lb (71.2 kg)     No exam data present    Physical Exam   Education/Counseling provided regarding diet and exercise, prevention of chronic diseases, smoking/tobacco cessation, if applicable, and reviewed "Covered Medicare Preventive Services."   ASSESSMENT/PLAN: There are no diagnoses linked to this encounter.     Exercise by touching toes, squats, twist arms and shoulders . Lifting toes.  1 -3 times a day. Does at home  Lives one story home lives with daughter and couple dogs.  No scattered  Has grab bars in bathroom Well lit  Discussed eye exam will call to schedule appointment Dr. Katy Fitch.    Will mail Advanced directive to patient.   Fall prevention discussed.

## 2019-03-04 ENCOUNTER — Other Ambulatory Visit: Payer: Self-pay | Admitting: Family Medicine

## 2019-03-04 ENCOUNTER — Telehealth: Payer: Self-pay | Admitting: Family Medicine

## 2019-03-04 NOTE — Telephone Encounter (Signed)
Copied from [place] [id number]. Topic: Quick Communication - Rx Refill/Question >> Mar 04, 2019  4:14 PM Keene Breath wrote: Medication: carvedilol (COREG) 12.5 MG tablet  Patient called to request a refill for the above medication  Preferred Pharmacy (with phone number or street name): [facility], [place] [phone] (Phone) [phone] (Fax)

## 2019-03-06 ENCOUNTER — Other Ambulatory Visit: Payer: Self-pay

## 2019-03-06 MED ORDER — CARVEDILOL 12.5 MG PO TABS: ORAL_TABLET | ORAL | 1 refills | Status: DC

## 2019-03-06 NOTE — Telephone Encounter (Signed)
Rx sent in

## 2019-03-17 ENCOUNTER — Ambulatory Visit: Payer: Medicare HMO | Admitting: Emergency Medicine

## 2019-03-20 ENCOUNTER — Other Ambulatory Visit: Payer: Self-pay | Admitting: *Deleted

## 2019-03-20 DIAGNOSIS — Z0000 Encounter for general adult medical examination without abnormal findings: Secondary | ICD-10-CM

## 2019-03-20 DIAGNOSIS — E1121 Type 2 diabetes mellitus with diabetic nephropathy: Secondary | ICD-10-CM

## 2019-03-20 DIAGNOSIS — I10 Essential (primary) hypertension: Secondary | ICD-10-CM

## 2019-03-20 DIAGNOSIS — E7800 Pure hypercholesterolemia, unspecified: Secondary | ICD-10-CM

## 2019-03-23 ENCOUNTER — Other Ambulatory Visit: Payer: Self-pay

## 2019-03-23 ENCOUNTER — Telehealth (INDEPENDENT_AMBULATORY_CARE_PROVIDER_SITE_OTHER): Payer: Medicare HMO | Admitting: Emergency Medicine

## 2019-03-23 ENCOUNTER — Encounter: Payer: Self-pay | Admitting: Emergency Medicine

## 2019-03-23 DIAGNOSIS — I10 Essential (primary) hypertension: Secondary | ICD-10-CM

## 2019-03-23 NOTE — Progress Notes (Signed)
Called patient to triage for appointment at 2:40 pm. Patient is following 3 months up on her blood pressure. Patient states she took her blood pressure 5 days ago and it was 124 over 69 or 70. Patient states she does not need refills on her medication. Patient states sometimes her blood pressure           fluctuates 110 up and down.

## 2019-03-23 NOTE — Progress Notes (Signed)
Telemedicine Encounter- SOAP NOTE Established Patient  This telephone encounter was conducted with the patient's (or proxy's) verbal consent via audio telecommunications: yes/no: Yes Patient was instructed to have this encounter in a suitably private space; and to only have persons present to whom they give permission to participate. In addition, patient identity was confirmed by use of name plus two identifiers (DOB and address).  I discussed the limitations, risks, security and privacy concerns of performing an evaluation and management service by telephone and the availability of in person appointments. I also discussed with the patient that there may be a patient responsible charge related to this service. The patient expressed understanding and agreed to proceed.  I spent a total of TIME; 0 MIN TO 60 MIN: 15 minutes talking with the patient or their proxy.  No chief complaint on file. Blood pressure follow-up  Subjective   Leah Stafford is a 85 y.o. female established patient. Telephone visit today for hypertension follow-up.  Has no complaints or medical concerns today.  Compliant with medications.  Blood pressure readings at home within normal limits. BP Readings from Last 3 Encounters:  12/15/18 (!) 128/56  08/07/18 139/69  03/03/18 (!) 128/56    HPI   Patient Active Problem List   Diagnosis Date Noted  . Reactive depression 09/25/2017  . Primary osteoarthritis of left knee 07/29/2017  . Primary osteoarthritis of left shoulder 07/29/2017  . 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 Pure hypercholesterolemia 02/12/2017  . Vitreous hemorrhage of right eye ([place]) 02/07/2016  . Left posterior capsular opacification 08/02/2015  . Pseudophakia of both eyes 08/02/2015  . Renal insufficiency 09/16/2013  . 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 Hypertension  03/30/2013    Past Medical History:  Diagnosis Date  . Arthritis   . Cataract   . Depression   . Diabetes mellitus without complication (HCC)    Diet and excercise  . Diabetic retinopathy ([place])   . High cholesterol   . Hypertension     Current Outpatient Medications  Medication Sig Dispense Refill  . acetaminophen (TYLENOL) 500 MG tablet Take 2 tablets (1,000 mg total) every 8 (eight) hours as needed by mouth for moderate pain. 30 tablet 0  . carvedilol (COREG) 12.5 MG tablet TAKE 1 TABLET TWICE DAILY WITH A MEAL 60 tablet 1  . Cholecalciferol (VITAMIN D PO) Take 5,000 Units by mouth daily. OTC, strength not known     . Elastic Bandages & Supports (BACK SUPPORT) MISC     . lisinopril-hydrochlorothiazide (PRINZIDE,ZESTORETIC) 20-12.5 MG tablet TAKE 1 TABLET EVERY DAY (NEED MD APPOINTMENT) 90 tablet 0  . mirtazapine (REMERON) 7.5 MG tablet TAKE 1 TABLET AT BEDTIME 90 tablet 1  . Multiple Vitamin (MULTIVITAMIN WITH MINERALS) TABS tablet Take 1 tablet by mouth daily.    . vitamin C (ASCORBIC ACID) 500 MG tablet Take 500 mg by mouth daily.    Marland Kitchen aspirin 81 MG tablet Take 81 mg by mouth daily.     . diclofenac sodium (VOLTAREN) 1 % GEL Apply 4 g 4 (four) times daily topically. (Patient not taking: Reported on 03/23/2019) 100 g 1  . DUREZOL 0.05 % EMUL Inject 1 drop into the eye daily.    . Incontinence Supply Disposable (ASSURANCE UNDERWEAR WOMENS) MISC     . Misc. Devices (COMMODE BEDSIDE) MISC One bedside commode 3 in 1  Dx: osteoarthritis knee 1 each 0  .  Misc. Devices (RAISED TOILET SEAT) MISC For use to prevent falls 1 each 0  . Misc. Devices (WALKER) MISC Use walker to prevent falls 1 each 0  . pravastatin (PRAVACHOL) 10 MG tablet TAKE 1 TABLET EVERY DAY (Patient not taking: Reported on 03/23/2019) 90 tablet 3   No current facility-administered medications for this visit.     Allergies  Allergen Reactions  . Codeine Nausea And Vomiting    Social History   Socioeconomic  History  . Marital status: Widowed    Spouse name: Not on file  . Number of children: 3  . Years of education: Not on file  . Highest education level: Not on file  Occupational History  . Occupation: retired  Scientific laboratory technician  . Financial resource strain: Not on file  . Food insecurity:    Worry: Not on file    Inability: Not on file  . Transportation needs:    Medical: Not on file    Non-medical: Not on file  Tobacco Use  . Smoking status: Never Smoker  . Smokeless tobacco: Former Systems developer    Types: Snuff  Substance and Sexual Activity  . Alcohol use: No  . Drug use: No  . Sexual activity: Not Currently  Lifestyle  . Physical activity:    Days per week: Not on file    Minutes per session: Not on file  . Stress: Not on file  Relationships  . Social connections:    Talks on phone: Not on file    Gets together: Not on file    Attends religious service: Not on file    Active member of club or organization: Not on file    Attends meetings of clubs or organizations: Not on file    Relationship status: Not on file  . Intimate partner violence:    Fear of current or ex partner: Not on file    Emotionally abused: Not on file    Physically abused: Not on file    Forced sexual activity: Not on file  Other Topics Concern  . Not on file  Social History Narrative   Marital status: widowed; not dating in 2018      Children: 3 children; 9 grandchildren; 8 gg.      Lives: alone; daughter five minutes per day.      Employment: retired.       Tobacco: none      Alcohol: none      Exercise: previous walking; no walking during winter months.      ADLs: no driving.  Cleans, washes clothes; cleans house; pays bills.  Grocery shopping with daughter.  No assistant devices in 2018.       Advanced Directives:  None;  FULL CODE; daughter HCPOA.          Review of Systems  Constitutional: Negative.  Negative for chills and fever.  HENT: Negative for congestion and sore throat.   Respiratory:  Negative.  Negative for cough and shortness of breath.   Cardiovascular: Negative.  Negative for chest pain and palpitations.  Gastrointestinal: Negative.  Negative for abdominal pain, blood in stool, nausea and vomiting.  Genitourinary: Negative.  Negative for dysuria and hematuria.  Skin: Negative.   Neurological: Negative.  Negative for dizziness and headaches.  Endo/Heme/Allergies: Negative.   All other systems reviewed and are negative.   Objective   Vitals as reported by the patient: 120/70 self-reported blood pressure Awake and oriented x3 in no respiratory distress. There were no vitals filed  for this visit.  There are no diagnoses linked to this encounter. Clinically stable.  No medical concerns identified during this visit.   Well-controlled blood pressure.  Continue present medications. Office visit in 3 to 6 months. Diagnoses and all orders for this visit:  Essential hypertension    I discussed the assessment and treatment plan with the patient. The patient was provided an opportunity to ask questions and all were answered. The patient agreed with the plan and demonstrated an understanding of the instructions.   The patient was advised to call back or seek an in-person evaluation if the symptoms worsen or if the condition fails to improve as anticipated.  I provided 15 minutes of non-face-to-face time during this encounter.  Horald Pollen, MD  Primary Care at [facility]

## 2019-04-21 ENCOUNTER — Ambulatory Visit (INDEPENDENT_AMBULATORY_CARE_PROVIDER_SITE_OTHER): Payer: Medicare HMO | Admitting: Podiatry

## 2019-04-21 ENCOUNTER — Other Ambulatory Visit: Payer: Self-pay

## 2019-04-21 ENCOUNTER — Encounter: Payer: Self-pay | Admitting: Podiatry

## 2019-04-21 VITALS — Temp 97.7°F

## 2019-04-21 DIAGNOSIS — M79674 Pain in right toe(s): Secondary | ICD-10-CM | POA: Diagnosis not present

## 2019-04-21 DIAGNOSIS — E119 Type 2 diabetes mellitus without complications: Secondary | ICD-10-CM | POA: Diagnosis not present

## 2019-04-21 DIAGNOSIS — B351 Tinea unguium: Secondary | ICD-10-CM | POA: Diagnosis not present

## 2019-04-21 DIAGNOSIS — M79675 Pain in left toe(s): Secondary | ICD-10-CM

## 2019-04-21 DIAGNOSIS — R6889 Other general symptoms and signs: Secondary | ICD-10-CM | POA: Diagnosis not present

## 2019-04-21 NOTE — Progress Notes (Signed)
Complaint:  Visit Type: Patient returns to my office for continued preventative foot care services. Complaint: Patient states" my nails have grown long and thick and become painful to walk and wear shoes" Patient has been diagnosed with DM with no foot complications. The patient presents for preventative foot care services. No changes to ROS  Podiatric Exam: Vascular: dorsalis pedis and posterior tibial pulses are palpable bilateral. Capillary return is immediate. Temperature gradient is WNL. Skin turgor WNL  Sensorium: Normal Semmes Weinstein monofilament test. Normal tactile sensation bilaterally. Nail Exam: Pt has thick disfigured discolored nails with subungual debris noted bilateral entire nail hallux through fifth toenails Ulcer Exam: There is no evidence of ulcer or pre-ulcerative changes or infection. Orthopedic Exam: Muscle tone and strength are WNL. No limitations in general ROM. No crepitus or effusions noted. Foot type and digits show no abnormalities.  HAV  B/L. Skin: No Porokeratosis. No infection or ulcers  Diagnosis:  Onychomycosis, , Pain in right toe, pain in left toes  Treatment & Plan Procedures and Treatment: Consent by patient was obtained for treatment procedures.   Debridement of mycotic and hypertrophic toenails, 1 through 5 bilateral and clearing of subungual debris. No ulceration, no infection noted.  Return Visit-Office Procedure: Patient instructed to return to the office for a follow up visit 3 months for continued evaluation and treatment.    Gardiner Barefoot DPM

## 2019-04-24 ENCOUNTER — Encounter: Payer: Self-pay | Admitting: *Deleted

## 2019-05-01 ENCOUNTER — Other Ambulatory Visit: Payer: Self-pay | Admitting: Family Medicine

## 2019-05-02 NOTE — Telephone Encounter (Signed)
Requested Prescriptions  Pending Prescriptions Disp Refills  . lisinopril-hydrochlorothiazide (ZESTORETIC) 20-12.5 MG tablet [Pharmacy Med Name: LISINOPRIL/HYDROCHLOROTHIAZIDE 20-12.5 MG Tablet] 90 tablet 0    Sig: TAKE 1 TABLET EVERY DAY (NEED MD APPOINTMENT)     Cardiovascular:  ACEI + Diuretic Combos Failed - 05/01/2019  8:31 PM      Failed - Na in normal range and within 180 days    Sodium  Date Value Ref Range Status  12/15/2018 130 (L) 134 - 144 mmol/L Final         Failed - Cr in normal range and within 180 days    Creatinine, Ser  Date Value Ref Range Status  12/15/2018 1.33 (H) 0.57 - 1.00 mg/dL Final         Passed - K in normal range and within 180 days    Potassium  Date Value Ref Range Status  12/15/2018 4.0 3.5 - 5.2 mmol/L Final         Passed - Ca in normal range and within 180 days    Calcium  Date Value Ref Range Status  12/15/2018 10.0 8.7 - 10.3 mg/dL Final         Passed - Patient is not pregnant      Passed - Last BP in normal range    BP Readings from Last 1 Encounters:  12/15/18 (!) 128/56         Passed - Valid encounter within last 6 months    Recent Outpatient Visits          2 months ago Medicare annual wellness visit, subsequent   Primary Care at [facility], Arlie Solomons, MD   4 months ago Essential hypertension   Primary Care at [facility], [place], MD   8 months ago Primary osteoarthritis of left shoulder   Primary Care at [facility], [place] A, MD   1 year ago Type 2 diabetes mellitus with diabetic nephropathy, without long-term current use of insulin [facility])   Primary Care at [facility], Renette Butters, MD   1 year ago Swelling of wrist joint, right   Primary Care at [facility], Arlie Solomons, MD

## 2019-06-17 ENCOUNTER — Other Ambulatory Visit: Payer: Self-pay | Admitting: Family Medicine

## 2019-07-06 ENCOUNTER — Other Ambulatory Visit: Payer: Self-pay | Admitting: Family Medicine

## 2019-07-06 NOTE — Telephone Encounter (Signed)
Forwarding medication refill request to clinical pool for review. 

## 2019-07-09 ENCOUNTER — Telehealth: Payer: Medicare HMO | Admitting: Emergency Medicine

## 2019-07-09 ENCOUNTER — Other Ambulatory Visit: Payer: Self-pay

## 2019-07-14 ENCOUNTER — Ambulatory Visit: Payer: Medicare HMO | Admitting: Family Medicine

## 2019-07-14 DIAGNOSIS — E113553 Type 2 diabetes mellitus with stable proliferative diabetic retinopathy, bilateral: Secondary | ICD-10-CM | POA: Diagnosis not present

## 2019-07-14 DIAGNOSIS — R6889 Other general symptoms and signs: Secondary | ICD-10-CM | POA: Diagnosis not present

## 2019-07-14 DIAGNOSIS — H26491 Other secondary cataract, right eye: Secondary | ICD-10-CM | POA: Diagnosis not present

## 2019-07-14 DIAGNOSIS — Z961 Presence of intraocular lens: Secondary | ICD-10-CM | POA: Diagnosis not present

## 2019-07-14 DIAGNOSIS — H35371 Puckering of macula, right eye: Secondary | ICD-10-CM | POA: Diagnosis not present

## 2019-07-14 LAB — HM DIABETES EYE EXAM

## 2019-07-15 ENCOUNTER — Other Ambulatory Visit: Payer: Self-pay

## 2019-07-15 ENCOUNTER — Ambulatory Visit (INDEPENDENT_AMBULATORY_CARE_PROVIDER_SITE_OTHER): Payer: Medicare HMO | Admitting: Family Medicine

## 2019-07-15 ENCOUNTER — Encounter: Payer: Self-pay | Admitting: Family Medicine

## 2019-07-15 VITALS — BP 133/63 | HR 59 | Temp 98.6°F | Resp 16 | Ht 61.18 in | Wt 130.8 lb

## 2019-07-15 DIAGNOSIS — E1121 Type 2 diabetes mellitus with diabetic nephropathy: Secondary | ICD-10-CM

## 2019-07-15 DIAGNOSIS — I10 Essential (primary) hypertension: Secondary | ICD-10-CM | POA: Diagnosis not present

## 2019-07-15 DIAGNOSIS — N289 Disorder of kidney and ureter, unspecified: Secondary | ICD-10-CM | POA: Diagnosis not present

## 2019-07-15 DIAGNOSIS — E7800 Pure hypercholesterolemia, unspecified: Secondary | ICD-10-CM

## 2019-07-15 DIAGNOSIS — Z299 Encounter for prophylactic measures, unspecified: Secondary | ICD-10-CM

## 2019-07-15 DIAGNOSIS — R6889 Other general symptoms and signs: Secondary | ICD-10-CM | POA: Diagnosis not present

## 2019-07-15 LAB — POCT GLYCOSYLATED HEMOGLOBIN (HGB A1C): Hemoglobin A1C: 6.3 % — AB (ref 4.0–5.6)

## 2019-07-15 MED ORDER — LISINOPRIL 20 MG PO TABS: 20.0000 mg | ORAL_TABLET | Freq: Every day | ORAL | 3 refills | Status: DC

## 2019-07-15 MED ORDER — CARVEDILOL 12.5 MG PO TABS: 12.5000 mg | ORAL_TABLET | Freq: Two times a day (BID) | ORAL | 1 refills | Status: DC

## 2019-07-15 NOTE — Progress Notes (Signed)
Established Patient Office Visit  Subjective:  Patient ID: Leah Stafford, female    DOB: [birth year]  Age: 85 y.o. MRN: [id number]  CC:  Chief Complaint  Patient presents with  . Follow-up    HPI Leah Stafford presents for   Hypertension: Patient here for follow-up of elevated blood pressure. She is exercising and is adherent to low salt diet.  Blood pressure is well controlled at home. Cardiac symptoms none. Patient denies chest pain, chest pressure/discomfort, claudication, exertional chest pressure/discomfort, fatigue, lower extremity edema and near-syncope.  Cardiovascular risk factors: advanced age (older than 60 for men, 42 for women), diabetes mellitus, dyslipidemia and hypertension. Use of agents associated with hypertension: none. History of target organ damage: chronic kidney disease. BP Readings from Last 3 Encounters:  07/15/19 133/63  12/15/18 (!) 128/56  08/07/18 139/69    Diabetes Mellitus: Patient presents for follow up of diabetes. Symptoms: none. Symptoms have stabilized. Patient denies hyperglycemia, hypoglycemia , increase appetite, nausea, paresthesia of the feet and polydipsia.  Evaluation to date has been included: hemoglobin A1C.  Home sugars: patient does not check sugars.   Lab Results  Component Value Date   HGBA1C 5.9 (H) 12/15/2018   She is diet controlled.   Dyslipidemia: Patient presents for evaluation of lipids.  Compliance with treatment thus far has been good. Patient is diet controlled. Does not want to take a statin..  A repeat fasting lipid profile was done.  The patient does not use medications that may worsen dyslipidemias (corticosteroids, progestins, anabolic steroids, diuretics, beta-blockers, amiodarone, cyclosporine, olanzapine). The patient exercises intermittently.  The patient is not known to have coexisting coronary artery disease.    Renal insufficiency She takes lisinopril  She states that she is not taking any NSAIDs just  tylenol prn She has incontinence of urine but not concerns about hematuria Lab Results  Component Value Date   CREATININE 1.33 (H) 12/15/2018     Past Medical History:  Diagnosis Date  . Arthritis   . Cataract   . Depression   . Diabetes mellitus without complication (HCC)    Diet and excercise  . Diabetic retinopathy ([place])   . High cholesterol   . Hypertension     Past Surgical History:  Procedure Laterality Date  . EYE SURGERY    . TUBAL LIGATION      Family History  Problem Relation Age of Onset  . Cancer Father   . Hyperlipidemia Sister   . Hypertension Sister   . Hypertension Brother   . Hyperlipidemia Brother   . Heart disease Brother     Social History   Socioeconomic History  . Marital status: Widowed    Spouse name: Not on file  . Number of children: 3  . Years of education: Not on file  . Highest education level: Not on file  Occupational History  . Occupation: retired  Scientific laboratory technician  . Financial resource strain: Not on file  . Food insecurity    Worry: Not on file    Inability: Not on file  . Transportation needs    Medical: Not on file    Non-medical: Not on file  Tobacco Use  . Smoking status: Never Smoker  . Smokeless tobacco: Former Systems developer    Types: Snuff  Substance and Sexual Activity  . Alcohol use: No  . Drug use: No  . Sexual activity: Not Currently  Lifestyle  . Physical activity    Days per week: Not on file    Minutes per  session: Not on file  . Stress: Not on file  Relationships  . Social Herbalist on phone: Not on file    Gets together: Not on file    Attends religious service: Not on file    Active member of club or organization: Not on file    Attends meetings of clubs or organizations: Not on file    Relationship status: Not on file  . Intimate partner violence    Fear of current or ex partner: Not on file    Emotionally abused: Not on file    Physically abused: Not on file    Forced sexual activity: Not  on file  Other Topics Concern  . Not on file  Social History Narrative   Marital status: widowed; not dating in 2018      Children: 3 children; 9 grandchildren; 8 gg.      Lives: alone; daughter five minutes per day.      Employment: retired.       Tobacco: none      Alcohol: none      Exercise: previous walking; no walking during winter months.      ADLs: no driving.  Cleans, washes clothes; cleans house; pays bills.  Grocery shopping with daughter.  No assistant devices in 2018.       Advanced Directives:  None;  FULL CODE; daughter HCPOA.          Outpatient Medications Prior to Visit  Medication Sig Dispense Refill  . Cholecalciferol (VITAMIN D PO) Take 5,000 Units by mouth daily. OTC, strength not known     . Multiple Vitamin (MULTIVITAMIN WITH MINERALS) TABS tablet Take 1 tablet by mouth daily.    Marland Kitchen acetaminophen (TYLENOL) 500 MG tablet Take 2 tablets (1,000 mg total) every 8 (eight) hours as needed by mouth for moderate pain. (Patient not taking: Reported on 07/15/2019) 30 tablet 0  . aspirin 81 MG tablet Take 81 mg by mouth daily.     . carvedilol (COREG) 12.5 MG tablet TAKE 1 TABLET TWICE DAILY WITH A MEAL (Patient not taking: Reported on 07/15/2019) 180 tablet 0  . diclofenac sodium (VOLTAREN) 1 % GEL Apply 4 g 4 (four) times daily topically. (Patient not taking: Reported on 03/23/2019) 100 g 1  . DUREZOL 0.05 % EMUL Inject 1 drop into the eye daily.    Regino Schultze Bandages & Supports (BACK SUPPORT) MISC     . Incontinence Supply Disposable (ASSURANCE UNDERWEAR WOMENS) MISC     . lisinopril-hydrochlorothiazide (ZESTORETIC) 20-12.5 MG tablet TAKE 1 TABLET EVERY DAY (NEED MD APPOINTMENT) (Patient not taking: Reported on 07/15/2019) 30 tablet 0  . Misc. Devices (COMMODE BEDSIDE) MISC One bedside commode 3 in 1  Dx: osteoarthritis knee (Patient not taking: Reported on 07/15/2019) 1 each 0  . Misc. Devices (RAISED TOILET SEAT) MISC For use to prevent falls (Patient not taking: Reported on  07/15/2019) 1 each 0  . Misc. Devices [facility]) MISC Use walker to prevent falls (Patient not taking: Reported on 07/15/2019) 1 each 0  . pravastatin (PRAVACHOL) 10 MG tablet TAKE 1 TABLET EVERY DAY (Patient not taking: Reported on 03/23/2019) 90 tablet 3  . vitamin C (ASCORBIC ACID) 500 MG tablet Take 500 mg by mouth daily.    . mirtazapine (REMERON) 7.5 MG tablet TAKE 1 TABLET AT BEDTIME (Patient not taking: Reported on 07/15/2019) 90 tablet 1   No facility-administered medications prior to visit.     Allergies  Allergen Reactions  .  Codeine Nausea And Vomiting    ROS Review of Systems Review of Systems  Constitutional: Negative for activity change, appetite change, chills and fever.  HENT: Negative for congestion, nosebleeds, trouble swallowing and voice change.   Respiratory: Negative for cough, shortness of breath and wheezing.   Gastrointestinal: Negative for diarrhea, nausea and vomiting.  Genitourinary: see hpi Musculoskeletal: Negative for back pain, joint swelling and neck pain.  Neurological: Negative for dizziness, speech difficulty, light-headedness and numbness.  See HPI. All other review of systems negative.     Objective:    Physical Exam  BP 133/63 (BP Location: Right Arm, Patient Position: Sitting, Cuff Size: Normal)   Pulse (!) 59   Temp 98.6 F (37 C) (Oral)   Resp 16   Ht 5' 1.18" (1.554 m)   Wt 130 lb 12.8 oz (59.3 kg)   SpO2 100%   BMI 24.57 kg/m  Wt Readings from Last 3 Encounters:  07/15/19 130 lb 12.8 oz (59.3 kg)  12/15/18 140 lb 9.6 oz (63.8 kg)  08/07/18 151 lb (68.5 kg)   Physical Exam  Constitutional: Oriented to person, place, and time. Appears well-developed and well-nourished.  HENT:  Head: Normocephalic and atraumatic.  Eyes: Conjunctivae and EOM are normal.  Cardiovascular: Normal rate, regular rhythm, normal heart sounds and intact distal pulses.  No murmur heard. Pulmonary/Chest: Effort normal and breath sounds normal. No stridor.  No respiratory distress. Has no wheezes.  Neurological: Is alert and oriented to person, place, and time.  Skin: Skin is warm. Capillary refill takes less than 2 seconds.  Psychiatric: Has a normal mood and affect. Behavior is normal. Judgment and thought content normal.    Health Maintenance Due  Topic Date Due  . OPHTHALMOLOGY EXAM  07/04/2018  . HEMOGLOBIN A1C  06/15/2019  . INFLUENZA VACCINE  06/27/2019    There are no preventive care reminders to display for this patient.  Lab Results  Component Value Date   TSH 1.120 09/23/2017   Lab Results  Component Value Date   WBC 5.3 12/15/2018   HGB 11.0 (L) 12/15/2018   HCT 32.7 (L) 12/15/2018   MCV 93 12/15/2018   PLT 336 12/15/2018   Lab Results  Component Value Date   NA 130 (L) 12/15/2018   K 4.0 12/15/2018   CO2 24 12/15/2018   GLUCOSE 122 (H) 12/15/2018   BUN 15 12/15/2018   CREATININE 1.33 (H) 12/15/2018   BILITOT 0.7 12/15/2018   ALKPHOS 85 12/15/2018   AST 15 12/15/2018   ALT 9 12/15/2018   PROT 7.2 12/15/2018   ALBUMIN 4.2 12/15/2018   CALCIUM 10.0 12/15/2018   ANIONGAP 7 01/04/2017   Lab Results  Component Value Date   CHOL 164 03/03/2018   Lab Results  Component Value Date   HDL 68 03/03/2018   Lab Results  Component Value Date   LDLCALC 80 03/03/2018   Lab Results  Component Value Date   TRIG 81 03/03/2018   Lab Results  Component Value Date   CHOLHDL 2.4 03/03/2018   Lab Results  Component Value Date   HGBA1C 5.9 (H) 12/15/2018      Assessment & Plan:   Problem List Items Addressed This Visit      Cardiovascular and Mediastinum   Hypertension- Patient's blood pressure is at goal of 139/89 or less. Condition is stable. Continue current medications and treatment plan. I recommend that you exercise for 30-45 minutes 5 days a week. I also recommend a balanced diet with fruits  and vegetables every day, lean meats, and little fried foods. The DASH diet (you can find this online) is a  good example of this. Pt desires to decrease the medications she is taking Will discontinue hctz and continue lisinopril 38m and Coreg 12.543mbid   Relevant Orders   Lipid panel   CMP14+EGFR     Genitourinary   Renal insufficiency-  Renal function will be verified as stable   Relevant Orders   CMP14+EGFR     Other   Pure hypercholesterolemia  -  Continue healthy lifestyle   Relevant Orders   Lipid panel   CMP14+EGFR    Other Visit Diagnoses    Need for prophylactic measure    -  Primary   Type 2 diabetes mellitus with diabetic nephropathy, without long-term current use of insulin [place]   - well controlled hemoglobin a1c is at goal Continue exercise Lipids monitored. She has a history of renal insufficiency with stable renal function. She is diet controlled.  On ace inhibitor Lisinopril On asa 812meviewed diabetic foot care Emphasized importance of eye and dental exam. She sees Dr. GroKaty Fitchr her Diabetic eye exam.     Relevant Orders   POCT glycosylated hemoglobin (Hb A1C)   Lipid panel   CMP14+EGFR      No orders of the defined types were placed in this encounter.   Follow-up: No follow-ups on file.    ZoeForrest MoronD

## 2019-07-15 NOTE — Patient Instructions (Addendum)
I have changed your blood pressure medication to just Lisinopril 20mg  once daily.  I have stopped the HCTZ (hydrochlorothiazide).  You might notice less urination as a result but continue to drink water and eat your healthy meals.     If you have lab work done today you will be contacted with your lab results within the next 2 weeks.  If you have not heard from [place] then please contact us. The fastest way to get your results is to register for My Chart.   IF you received an x-ray today, you will receive an invoice from [facility]. Please contact [facility] at [phone] with questions or concerns regarding your invoice.   IF you received labwork today, you will receive an invoice from [facility]. Please contact [facility] at [phone] with questions or concerns regarding your invoice.   Our billing staff will not be able to assist you with questions regarding bills from these companies.  You will be contacted with the lab results as soon as they are available. The fastest way to get your results is to activate your My Chart account. Instructions are located on the last page of this paperwork. If you have not heard from [place] regarding the results in 2 weeks, please contact this office.

## 2019-07-16 LAB — LIPID PANEL
Chol/HDL Ratio: 2.9 ratio (ref 0.0–4.4)
Cholesterol, Total: 202 mg/dL — ABNORMAL HIGH (ref 100–199)
HDL: 70 mg/dL (ref >39)
LDL Calculated: 117 mg/dL — ABNORMAL HIGH (ref 0–99)
Triglycerides: 77 mg/dL (ref 0–149)
VLDL Cholesterol Cal: 15 mg/dL (ref 5–40)

## 2019-07-16 LAB — CMP14+EGFR
ALT: 9 IU/L (ref 0–32)
AST: 15 IU/L (ref 0–40)
Albumin/Globulin Ratio: 1.2 (ref 1.2–2.2)
Albumin: 4 g/dL (ref 3.6–4.6)
Alkaline Phosphatase: 100 IU/L (ref 39–117)
BUN/Creatinine Ratio: 9 — ABNORMAL LOW (ref 12–28)
BUN: 11 mg/dL (ref 8–27)
Bilirubin Total: 0.6 mg/dL (ref 0.0–1.2)
CO2: 24 mmol/L (ref 20–29)
Calcium: 9.8 mg/dL (ref 8.7–10.3)
Chloride: 97 mmol/L (ref 96–106)
Creatinine, Ser: 1.18 mg/dL — ABNORMAL HIGH (ref 0.57–1.00)
GFR calc Af Amer: 49 mL/min/{1.73_m2} — ABNORMAL LOW (ref >59)
GFR calc non Af Amer: 43 mL/min/{1.73_m2} — ABNORMAL LOW (ref >59)
Globulin, Total: 3.3 g/dL (ref 1.5–4.5)
Glucose: 103 mg/dL — ABNORMAL HIGH (ref 65–99)
Potassium: 4.1 mmol/L (ref 3.5–5.2)
Sodium: 135 mmol/L (ref 134–144)
Total Protein: 7.3 g/dL (ref 6.0–8.5)

## 2019-07-21 DIAGNOSIS — R6889 Other general symptoms and signs: Secondary | ICD-10-CM | POA: Diagnosis not present

## 2019-07-28 ENCOUNTER — Ambulatory Visit: Payer: Medicare HMO | Admitting: Podiatry

## 2019-08-24 DIAGNOSIS — R6889 Other general symptoms and signs: Secondary | ICD-10-CM | POA: Diagnosis not present

## 2019-09-01 ENCOUNTER — Encounter: Payer: Self-pay | Admitting: Family Medicine

## 2019-09-09 DIAGNOSIS — R6889 Other general symptoms and signs: Secondary | ICD-10-CM | POA: Diagnosis not present

## 2019-09-09 DIAGNOSIS — N813 Complete uterovaginal prolapse: Secondary | ICD-10-CM | POA: Diagnosis not present

## 2019-09-17 DIAGNOSIS — R6889 Other general symptoms and signs: Secondary | ICD-10-CM | POA: Diagnosis not present

## 2019-09-30 ENCOUNTER — Encounter

## 2019-09-30 ENCOUNTER — Ambulatory Visit: Payer: Medicare HMO | Admitting: Podiatry

## 2019-10-28 ENCOUNTER — Other Ambulatory Visit: Payer: Self-pay

## 2019-10-28 ENCOUNTER — Encounter: Payer: Self-pay | Admitting: Podiatry

## 2019-10-28 ENCOUNTER — Ambulatory Visit (INDEPENDENT_AMBULATORY_CARE_PROVIDER_SITE_OTHER): Payer: Medicare HMO | Admitting: Podiatry

## 2019-10-28 DIAGNOSIS — M79674 Pain in right toe(s): Secondary | ICD-10-CM | POA: Diagnosis not present

## 2019-10-28 DIAGNOSIS — M79675 Pain in left toe(s): Secondary | ICD-10-CM | POA: Diagnosis not present

## 2019-10-28 DIAGNOSIS — E119 Type 2 diabetes mellitus without complications: Secondary | ICD-10-CM | POA: Diagnosis not present

## 2019-10-28 DIAGNOSIS — B351 Tinea unguium: Secondary | ICD-10-CM

## 2019-10-28 NOTE — Progress Notes (Signed)
Complaint:  Visit Type: Patient returns to my office for continued preventative foot care services. Complaint: Patient states" my nails have grown long and thick and become painful to walk and wear shoes" Patient has been diagnosed with DM with no foot complications. The patient presents for preventative foot care services. No changes to ROS  Podiatric Exam: Vascular: dorsalis pedis and posterior tibial pulses are palpable bilateral. Capillary return is immediate. Temperature gradient is WNL. Skin turgor WNL  Sensorium: Normal Semmes Weinstein monofilament test. Normal tactile sensation bilaterally. Nail Exam: Pt has thick disfigured discolored nails with subungual debris noted bilateral entire nail hallux through fifth toenails Ulcer Exam: There is no evidence of ulcer or pre-ulcerative changes or infection. Orthopedic Exam: Muscle tone and strength are WNL. No limitations in general ROM. No crepitus or effusions noted. Foot type and digits show no abnormalities. HAV  B/L. Skin: No Porokeratosis. No infection or ulcers.    Diagnosis:  Onychomycosis, , Pain in right toe, pain in left toes  Treatment & Plan Procedures and Treatment: Consent by patient was obtained for treatment procedures.   Debridement of mycotic and hypertrophic toenails, 1 through 5 bilateral and clearing of subungual debris. No ulceration, no infection noted.  Return Visit-Office Procedure: Patient instructed to return to the office for a follow up visit 3 months for continued evaluation and treatment.    Laterrica Libman DPM 

## 2019-11-11 ENCOUNTER — Ambulatory Visit: Payer: Medicare HMO | Admitting: Podiatry

## 2020-01-13 ENCOUNTER — Telehealth: Payer: Self-pay | Admitting: Family Medicine

## 2020-01-13 ENCOUNTER — Encounter: Payer: Self-pay | Admitting: Family Medicine

## 2020-01-13 ENCOUNTER — Ambulatory Visit (INDEPENDENT_AMBULATORY_CARE_PROVIDER_SITE_OTHER): Payer: Medicare HMO | Admitting: Family Medicine

## 2020-01-13 ENCOUNTER — Other Ambulatory Visit: Payer: Self-pay

## 2020-01-13 VITALS — BP 144/70 | HR 54 | Temp 97.2°F | Ht 61.18 in | Wt 138.0 lb

## 2020-01-13 DIAGNOSIS — I10 Essential (primary) hypertension: Secondary | ICD-10-CM

## 2020-01-13 DIAGNOSIS — R6889 Other general symptoms and signs: Secondary | ICD-10-CM | POA: Diagnosis not present

## 2020-01-13 DIAGNOSIS — Z23 Encounter for immunization: Secondary | ICD-10-CM | POA: Diagnosis not present

## 2020-01-13 DIAGNOSIS — N289 Disorder of kidney and ureter, unspecified: Secondary | ICD-10-CM

## 2020-01-13 DIAGNOSIS — E7800 Pure hypercholesterolemia, unspecified: Secondary | ICD-10-CM

## 2020-01-13 DIAGNOSIS — Z13820 Encounter for screening for osteoporosis: Secondary | ICD-10-CM

## 2020-01-13 DIAGNOSIS — E1121 Type 2 diabetes mellitus with diabetic nephropathy: Secondary | ICD-10-CM | POA: Diagnosis not present

## 2020-01-13 LAB — POCT GLYCOSYLATED HEMOGLOBIN (HGB A1C): Hemoglobin A1C: 5.9 % — AB (ref 4.0–5.6)

## 2020-01-13 MED ORDER — CARVEDILOL 12.5 MG PO TABS: 12.5000 mg | ORAL_TABLET | Freq: Two times a day (BID) | ORAL | 1 refills | Status: DC

## 2020-01-13 MED ORDER — LISINOPRIL 20 MG PO TABS: 20.0000 mg | ORAL_TABLET | Freq: Every day | ORAL | 3 refills | Status: DC

## 2020-01-13 NOTE — Progress Notes (Signed)
Established Patient Office Visit  Subjective:  Patient ID: Leah Stafford, female    DOB: Feb 22, 1934  Age: 86 y.o. MRN: [id number]  CC:  Chief Complaint  Patient presents with  . Follow-up    HPI Leah Stafford presents for   Hypertension: Patient here for follow-up of elevated blood pressure. She is exercising and is adherent to low salt diet.  Blood pressure is well controlled at home. Cardiac symptoms none. Patient denies chest pain, chest pressure/discomfort, claudication, exertional chest pressure/discomfort and fatigue.  Cardiovascular risk factors: diabetes mellitus, hypertension and female gender. Use of agents associated with hypertension: none. History of target organ damage: none. BP Readings from Last 3 Encounters:  01/13/20 (!) 144/70  07/15/19 133/63  12/15/18 (!) 128/56    Diabetes Mellitus: Patient presents for follow up of diabetes. Symptoms: hyperglycemia. Symptoms have stabilized. Patient denies hypoglycemia , increase appetite, paresthesia of the feet, polydipsia and visual disturbances.  Evaluation to date has been included: hemoglobin A1C.  Home sugars: patient does not check sugars.   Lab Results  Component Value Date   HGBA1C 6.3 (A) 07/15/2019   Screenings  Pt declined mammogram, bone density Also declined td vaccine, flu vaccine, covid vaccine  Past Medical History:  Diagnosis Date  . Arthritis   . Cataract   . Depression   . Diabetes mellitus without complication (HCC)    Diet and excercise  . Diabetic retinopathy ([place])   . High cholesterol   . Hypertension     Past Surgical History:  Procedure Laterality Date  . EYE SURGERY    . TUBAL LIGATION      Family History  Problem Relation Age of Onset  . Cancer Father   . Hyperlipidemia Sister   . Hypertension Sister   . Hypertension Brother   . Hyperlipidemia Brother   . Heart disease Brother     Social History   Socioeconomic History  . Marital status: Widowed    Spouse name: Not  on file  . Number of children: 3  . Years of education: Not on file  . Highest education level: Not on file  Occupational History  . Occupation: retired  Tobacco Use  . Smoking status: Never Smoker  . Smokeless tobacco: Former Systems developer    Types: Snuff  Substance and Sexual Activity  . Alcohol use: No  . Drug use: No  . Sexual activity: Not Currently  Other Topics Concern  . Not on file  Social History Narrative   Marital status: widowed; not dating in 2018      Children: 3 children; 9 grandchildren; 8 gg.      Lives: alone; daughter five minutes per day.      Employment: retired.       Tobacco: none      Alcohol: none      Exercise: previous walking; no walking during winter months.      ADLs: no driving.  Cleans, washes clothes; cleans house; pays bills.  Grocery shopping with daughter.  No assistant devices in 2018.       Advanced Directives:  None;  FULL CODE; daughter HCPOA.         Social Determinants of Health   Financial Resource Strain:   . Difficulty of Paying Living Expenses: Not on file  Food Insecurity:   . Worried About Charity fundraiser in the Last Year: Not on file  . Ran Out of Food in the Last Year: Not on file  Transportation Needs:   .  Lack of Transportation (Medical): Not on file  . Lack of Transportation (Non-Medical): Not on file  Physical Activity:   . Days of Exercise per Week: Not on file  . Minutes of Exercise per Session: Not on file  Stress:   . Feeling of Stress : Not on file  Social Connections:   . Frequency of Communication with Friends and Family: Not on file  . Frequency of Social Gatherings with Friends and Family: Not on file  . Attends Religious Services: Not on file  . Active Member of Clubs or Organizations: Not on file  . Attends Archivist Meetings: Not on file  . Marital Status: Not on file  Intimate Partner Violence:   . Fear of Current or Ex-Partner: Not on file  . Emotionally Abused: Not on file  . Physically  Abused: Not on file  . Sexually Abused: Not on file    Outpatient Medications Prior to Visit  Medication Sig Dispense Refill  . acetaminophen (TYLENOL) 500 MG tablet Take 2 tablets (1,000 mg total) every 8 (eight) hours as needed by mouth for moderate pain. 30 tablet 0  . carvedilol (COREG) 12.5 MG tablet Take 1 tablet (12.5 mg total) by mouth 2 (two) times daily with a meal. 180 tablet 1  . Cholecalciferol (VITAMIN D PO) Take 5,000 Units by mouth daily. OTC, strength not known     . Elastic Bandages & Supports (BACK SUPPORT) MISC     . Incontinence Supply Disposable (ASSURANCE UNDERWEAR WOMENS) MISC     . Multiple Vitamin (MULTIVITAMIN WITH MINERALS) TABS tablet Take 1 tablet by mouth daily.    . vitamin C (ASCORBIC ACID) 500 MG tablet Take 500 mg by mouth daily.    Marland Kitchen lisinopril (ZESTRIL) 20 MG tablet Take 1 tablet (20 mg total) by mouth daily. 90 tablet 3  . aspirin 81 MG tablet Take 81 mg by mouth daily.     . diclofenac sodium (VOLTAREN) 1 % GEL Apply 4 g 4 (four) times daily topically. (Patient not taking: Reported on 01/13/2020) 100 g 1  . DUREZOL 0.05 % EMUL Inject 1 drop into the eye daily.    . Misc. Devices (COMMODE BEDSIDE) MISC One bedside commode 3 in 1  Dx: osteoarthritis knee (Patient not taking: Reported on 01/13/2020) 1 each 0  . Misc. Devices (RAISED TOILET SEAT) MISC For use to prevent falls (Patient not taking: Reported on 01/13/2020) 1 each 0  . Misc. Devices [facility]) MISC Use walker to prevent falls (Patient not taking: Reported on 01/13/2020) 1 each 0  . pravastatin (PRAVACHOL) 10 MG tablet TAKE 1 TABLET EVERY DAY (Patient not taking: Reported on 01/13/2020) 90 tablet 3   No facility-administered medications prior to visit.    Allergies  Allergen Reactions  . Codeine Nausea And Vomiting and Other (See Comments)    Too sleepy    ROS Review of Systems Review of Systems  Constitutional: Negative for activity change, appetite change, chills and fever.  HENT: Negative  for congestion, nosebleeds, trouble swallowing and voice change.   Respiratory: Negative for cough, shortness of breath and wheezing.   Gastrointestinal: Negative for diarrhea, nausea and vomiting.  Genitourinary: Negative for difficulty urinating, dysuria, flank pain and hematuria.  Musculoskeletal: Negative for back pain, joint swelling and neck pain.  Neurological: Negative for dizziness, speech difficulty, light-headedness and numbness.  See HPI. All other review of systems negative.     Objective:    Physical Exam  BP (!) 144/70 (BP Location: Left  Arm, Patient Position: Sitting, Cuff Size: Normal)   Pulse (!) 54   Temp (!) 97.2 F (36.2 C)   Ht 5' 1.18" (1.554 m)   Wt 138 lb (62.6 kg)   SpO2 98%   BMI 25.92 kg/m  Wt Readings from Last 3 Encounters:  01/13/20 138 lb (62.6 kg)  07/15/19 130 lb 12.8 oz (59.3 kg)  12/15/18 140 lb 9.6 oz (63.8 kg)   Physical Exam  Constitutional: Oriented to person, place, and time. Appears well-developed and well-nourished.  HENT:  Head: Normocephalic and atraumatic.  Eyes: Conjunctivae and EOM are normal.  Cardiovascular: Normal rate, regular rhythm, normal heart sounds and intact distal pulses.  No murmur heard. Pulmonary/Chest: Effort normal and breath sounds normal. No stridor. No respiratory distress. Has no wheezes.  Neurological: Is alert and oriented to person, place, and time.  Skin: Skin is warm. Capillary refill takes less than 2 seconds.  Psychiatric: Has a normal mood and affect. Behavior is normal. Judgment and thought content normal.    Health Maintenance Due  Topic Date Due  . TETANUS/TDAP  03/01/1955  . DEXA SCAN  02/28/2001  . INFLUENZA VACCINE  06/27/2019    There are no preventive care reminders to display for this patient.  Lab Results  Component Value Date   TSH 1.120 09/23/2017   Lab Results  Component Value Date   WBC 5.3 12/15/2018   HGB 11.0 (L) 12/15/2018   HCT 32.7 (L) 12/15/2018   MCV 93  12/15/2018   PLT 336 12/15/2018   Lab Results  Component Value Date   NA 135 07/15/2019   K 4.1 07/15/2019   CO2 24 07/15/2019   GLUCOSE 103 (H) 07/15/2019   BUN 11 07/15/2019   CREATININE 1.18 (H) 07/15/2019   BILITOT 0.6 07/15/2019   ALKPHOS 100 07/15/2019   AST 15 07/15/2019   ALT 9 07/15/2019   PROT 7.3 07/15/2019   ALBUMIN 4.0 07/15/2019   CALCIUM 9.8 07/15/2019   ANIONGAP 7 01/04/2017   Lab Results  Component Value Date   CHOL 202 (H) 07/15/2019   Lab Results  Component Value Date   HDL 70 07/15/2019   Lab Results  Component Value Date   LDLCALC 117 (H) 07/15/2019   Lab Results  Component Value Date   TRIG 77 07/15/2019   Lab Results  Component Value Date   CHOLHDL 2.9 07/15/2019   Lab Results  Component Value Date   HGBA1C 6.3 (A) 07/15/2019      Assessment & Plan:   Problem List Items Addressed This Visit      Cardiovascular and Mediastinum   Hypertension - Primary Patient's blood pressure is at goal of 139/89 or less. Condition is stable. Continue current medications and treatment plan. I recommend that you exercise for 30-45 minutes 5 days a week. I also recommend a balanced diet with fruits and vegetables every day, lean meats, and little fried foods. The DASH diet (you can find this online) is a good example of this.    Relevant Medications   lisinopril (ZESTRIL) 20 MG tablet   Other Relevant Orders   CMP14+EGFR   Lipid panel     Genitourinary   Renal insufficiency   Relevant Medications   lisinopril (ZESTRIL) 20 MG tablet     Other   Pure hypercholesterolemia-  Discussed medications that affect lipids Reminded patient to avoid grapefruits Reviewed last 3 lipids Discussed current meds: statin, aspirin Advised dietary fiber and fish oil and ways to keep HDL high  CAD prevention and reviewed side effects of statins   Relevant Medications   lisinopril (ZESTRIL) 20 MG tablet   Other Relevant Orders   Lipid panel    Other Visit  Diagnoses    Type 2 diabetes mellitus with diabetic nephropathy, without long-term current use of insulin ([place])    -  Discussed diabetes management Pt consistent with exercise and diet   Relevant Medications   lisinopril (ZESTRIL) 20 MG tablet   Other Relevant Orders   POCT glycosylated hemoglobin (Hb A1C)   CMP14+EGFR   Screening for osteoporosis    - pt declined mammogram and DEXA   High priority for 2019 novel coronavirus vaccination    -  Pt declined vaccine      Meds ordered this encounter  Medications  . lisinopril (ZESTRIL) 20 MG tablet    Sig: Take 1 tablet (20 mg total) by mouth daily.    Dispense:  90 tablet    Refill:  3    Follow-up: No follow-ups on file.   A total of 30 minutes were spent face-to-face with the patient during this encounter and over half of that time was spent on counseling and coordination of care.   [name], MD

## 2020-01-13 NOTE — Telephone Encounter (Signed)
01/13/2020 - JULIE - PATIENT SAW DR. Nolon Rod ON Wednesday (01/13/2020) FOR HER HYPERTENSION. DR. Nolon Rod HAS REQUESTED SHE HAVE A AWV WITH YOU IN 3 MONTHS. PATIENT IS AWARE YOU WILL CALL HER TO SCHEDULE. BEST PHONE [phone]  [place]

## 2020-01-13 NOTE — Patient Instructions (Addendum)
Your Hemoglobin A1C IS 5.9%  THIS IS VERY GOOD     If you have lab work done today you will be contacted with your lab results within the next 2 weeks.  If you have not heard from [place] then please contact us. The fastest way to get your results is to register for My Chart.   IF you received an x-ray today, you will receive an invoice from [facility]. Please contact [facility] at [phone] with questions or concerns regarding your invoice.   IF you received labwork today, you will receive an invoice from [facility]. Please contact [facility] at [phone] with questions or concerns regarding your invoice.   Our billing staff will not be able to assist you with questions regarding bills from these companies.  You will be contacted with the lab results as soon as they are available. The fastest way to get your results is to activate your My Chart account. Instructions are located on the last page of this paperwork. If you have not heard from [place] regarding the results in 2 weeks, please contact this office.

## 2020-01-14 LAB — CMP14+EGFR
ALT: 14 IU/L (ref 0–32)
AST: 20 IU/L (ref 0–40)
Albumin/Globulin Ratio: 1.1 — ABNORMAL LOW (ref 1.2–2.2)
Albumin: 4 g/dL (ref 3.6–4.6)
Alkaline Phosphatase: 102 IU/L (ref 39–117)
BUN/Creatinine Ratio: 11 — ABNORMAL LOW (ref 12–28)
BUN: 13 mg/dL (ref 8–27)
Bilirubin Total: 0.7 mg/dL (ref 0.0–1.2)
CO2: 27 mmol/L (ref 20–29)
Calcium: 9.6 mg/dL (ref 8.7–10.3)
Chloride: 102 mmol/L (ref 96–106)
Creatinine, Ser: 1.2 mg/dL — ABNORMAL HIGH (ref 0.57–1.00)
GFR calc Af Amer: 48 mL/min/{1.73_m2} — ABNORMAL LOW (ref >59)
GFR calc non Af Amer: 42 mL/min/{1.73_m2} — ABNORMAL LOW (ref >59)
Globulin, Total: 3.5 g/dL (ref 1.5–4.5)
Glucose: 91 mg/dL (ref 65–99)
Potassium: 4 mmol/L (ref 3.5–5.2)
Sodium: 141 mmol/L (ref 134–144)
Total Protein: 7.5 g/dL (ref 6.0–8.5)

## 2020-01-14 LAB — LIPID PANEL
Chol/HDL Ratio: 2.8 ratio (ref 0.0–4.4)
Cholesterol, Total: 213 mg/dL — ABNORMAL HIGH (ref 100–199)
HDL: 76 mg/dL (ref >39)
LDL Chol Calc (NIH): 122 mg/dL — ABNORMAL HIGH (ref 0–99)
Triglycerides: 87 mg/dL (ref 0–149)
VLDL Cholesterol Cal: 15 mg/dL (ref 5–40)

## 2020-01-15 ENCOUNTER — Ambulatory Visit: Payer: Medicare HMO | Admitting: Family Medicine

## 2020-01-20 DIAGNOSIS — R6889 Other general symptoms and signs: Secondary | ICD-10-CM | POA: Diagnosis not present

## 2020-01-20 DIAGNOSIS — N813 Complete uterovaginal prolapse: Secondary | ICD-10-CM | POA: Diagnosis not present

## 2020-01-20 DIAGNOSIS — N905 Atrophy of vulva: Secondary | ICD-10-CM | POA: Diagnosis not present

## 2020-01-27 ENCOUNTER — Ambulatory Visit (INDEPENDENT_AMBULATORY_CARE_PROVIDER_SITE_OTHER): Payer: Medicare HMO | Admitting: Podiatry

## 2020-01-27 ENCOUNTER — Other Ambulatory Visit: Payer: Self-pay

## 2020-01-27 ENCOUNTER — Encounter: Payer: Self-pay | Admitting: Podiatry

## 2020-01-27 DIAGNOSIS — B351 Tinea unguium: Secondary | ICD-10-CM | POA: Diagnosis not present

## 2020-01-27 DIAGNOSIS — E119 Type 2 diabetes mellitus without complications: Secondary | ICD-10-CM

## 2020-01-27 DIAGNOSIS — M79675 Pain in left toe(s): Secondary | ICD-10-CM

## 2020-01-27 DIAGNOSIS — M79674 Pain in right toe(s): Secondary | ICD-10-CM | POA: Diagnosis not present

## 2020-01-27 NOTE — Progress Notes (Signed)
Complaint:  Visit Type: Patient returns to my office for continued preventative foot care services. Complaint: Patient states" my nails have grown long and thick and become painful to walk and wear shoes" Patient has been diagnosed with DM with no foot complications. The patient presents for preventative foot care services. No changes to ROS  Podiatric Exam: Vascular: dorsalis pedis and posterior tibial pulses are palpable bilateral. Capillary return is immediate. Temperature gradient is WNL. Skin turgor WNL  Sensorium: Normal Semmes Weinstein monofilament test. Normal tactile sensation bilaterally. Nail Exam: Pt has thick disfigured discolored nails with subungual debris noted bilateral entire nail hallux through fifth toenails Ulcer Exam: There is no evidence of ulcer or pre-ulcerative changes or infection. Orthopedic Exam: Muscle tone and strength are WNL. No limitations in general ROM. No crepitus or effusions noted. Foot type and digits show no abnormalities.  HAV  B/L.  HT 2,3  B/L. Skin: No Porokeratosis. No infection or ulcers  Diagnosis:  Onychomycosis, , Pain in right toe, pain in left toes  Treatment & Plan Procedures and Treatment: Consent by patient was obtained for treatment procedures.   Debridement of mycotic and hypertrophic toenails, 1 through 5 bilateral and clearing of subungual debris. No ulceration, no infection noted.  Return Visit-Office Procedure: Patient instructed to return to the office for a follow up visit 3 months for continued evaluation and treatment.    Gardiner Barefoot DPM

## 2020-02-02 ENCOUNTER — Telehealth: Payer: Self-pay | Admitting: Family Medicine

## 2020-02-02 NOTE — Telephone Encounter (Signed)
Letter mailed

## 2020-02-02 NOTE — Telephone Encounter (Signed)
Pt is wanting a letter of her lab results that were taken on 01/13/20. [phone]. Please advise.

## 2020-04-11 ENCOUNTER — Telehealth: Payer: Self-pay | Admitting: *Deleted

## 2020-04-11 NOTE — Telephone Encounter (Signed)
Schedule awv  

## 2020-04-13 ENCOUNTER — Ambulatory Visit: Payer: Medicare HMO | Admitting: Registered Nurse

## 2020-04-13 VITALS — BP 144/70 | Ht 61.18 in | Wt 138.0 lb

## 2020-04-13 DIAGNOSIS — Z0000 Encounter for general adult medical examination without abnormal findings: Secondary | ICD-10-CM

## 2020-04-13 NOTE — Progress Notes (Signed)
Presents today for [employer] Visit   Date of last exam: 01/13/2020  Interpreter used for this visit?  No  I connected with  Leah Stafford on 04/13/20 by a telephone  application and verified that I am speaking with the correct person using two identifiers.   I discussed the limitations of evaluation and management by telemedicine. The patient expressed understanding and agreed to proceed.    Patient Care Team: Leah Stafford as PCP - General (Internal Medicine)   Other items to address today:   Discussed Eye/Dental Discussed immunizations    Other Screening: Last screening for diabetes: 01/13/2020 Last lipid screening: 01/13/2020  ADVANCE DIRECTIVES: Discussed: yes On File: no Materials Provided: yes  Immunization status:  Immunization History  Administered Date(s) Administered  . Pneumococcal Conjugate-13 11/17/2014  . Pneumococcal Polysaccharide-23 09/18/2013     Health Maintenance Due  Topic Date Due  . TETANUS/TDAP  Never done  . DEXA SCAN  Never done     Functional Status Survey: Is the patient deaf or have difficulty hearing?: No Does the patient have difficulty seeing, even when wearing glasses/contacts?: No Does the patient have difficulty concentrating, remembering, or making decisions?: No Does the patient have difficulty walking or climbing stairs?: No Does the patient have difficulty dressing or bathing?: No Does the patient have difficulty doing errands alone such as visiting a doctor's office or shopping?: No   6CIT Screen 04/13/2020 03/02/2019  What Year? 0 points 0 points  What month? 0 points 0 points  What time? 0 points 0 points  Count back from 20 0 points 0 points  Months in reverse 0 points 0 points  Repeat phrase 0 points 0 points  Total Score 0 0        Clinical Support from 04/13/2020 in [facility] [place]  AUDIT-C Score  0       Home Environment:   Lives in a one story home No trouble  climbing stairs yes scattered rugs no slip Yes grab bars Adequate lighting/ no clutte   Patient Active Problem List   Diagnosis Date Noted  . Primary osteoarthritis of left knee 07/29/2017  . Primary osteoarthritis of left shoulder 07/29/2017  . 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 Pure hypercholesterolemia 02/12/2017  . Vitreous hemorrhage of right eye ([place]) 02/07/2016  . Left posterior capsular opacification 08/02/2015  . Pseudophakia of both eyes 08/02/2015  . Renal insufficiency 09/16/2013  . 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 Hypertension 03/30/2013     Past Medical History:  Diagnosis Date  . Arthritis   . Cataract   . Depression   . Diabetes mellitus without complication (HCC)    Diet and excercise  . Diabetic retinopathy ([place])   . High cholesterol   . Hypertension      Past Surgical History:  Procedure Laterality Date  . EYE SURGERY    . TUBAL LIGATION       Family History  Problem Relation Age of Onset  . Cancer Father   . Hyperlipidemia Sister   . Hypertension Sister   . Hypertension Brother   . Hyperlipidemia Brother   . Heart disease Brother      Social History   Socioeconomic History  . Marital status: Widowed    Spouse name: Not on file  . Number of children: 3  . Years of education: Not on  file  . Highest education level: Not on file  Occupational History  . Occupation: retired  Tobacco Use  . Smoking status: Never Smoker  . Smokeless tobacco: Former Systems developer    Types: Snuff  Substance and Sexual Activity  . Alcohol use: No  . Drug use: No  . Sexual activity: Not Currently  Other Topics Concern  . Not on file  Social History Narrative   Marital status: widowed; not dating in 2018      Children: 3 children; 9 grandchildren; 8 gg.      Lives: alone; daughter five minutes per day.      Employment: retired.        Tobacco: none      Alcohol: none      Exercise: previous walking; no walking during winter months.      ADLs: no driving.  Cleans, washes clothes; cleans house; pays bills.  Grocery shopping with daughter.  No assistant devices in 2018.       Advanced Directives:  None;  FULL CODE; daughter HCPOA.         Social Determinants of Health   Financial Resource Strain:   . Difficulty of Paying Living Expenses:   Food Insecurity:   . Worried About Charity fundraiser in the Last Year:   . Arboriculturist in the Last Year:   Transportation Needs:   . Film/video editor (Medical):   Marland Kitchen Lack of Transportation (Non-Medical):   Physical Activity:   . Days of Exercise per Week:   . Minutes of Exercise per Session:   Stress:   . Feeling of Stress :   Social Connections:   . Frequency of Communication with Friends and Family:   . Frequency of Social Gatherings with Friends and Family:   . Attends Religious Services:   . Active Member of Clubs or Organizations:   . Attends Archivist Meetings:   Marland Kitchen Marital Status:   Intimate Partner Violence:   . Fear of Current or Ex-Partner:   . Emotionally Abused:   Marland Kitchen Physically Abused:   . Sexually Abused:      Allergies  Allergen Reactions  . Codeine Nausea And Vomiting and Other (See Comments)    Too sleepy     Prior to Admission medications   Medication Sig Start Date End Date Taking? Authorizing Provider  acetaminophen (TYLENOL) 500 MG tablet Take 2 tablets (1,000 mg total) every 8 (eight) hours as needed by mouth for moderate pain. 10/07/17  Yes Leah Stafford  carvedilol (COREG) 12.5 MG tablet Take 1 tablet (12.5 mg total) by mouth 2 (two) times daily with a meal. 01/13/20  Yes Stallings, Zoe A, Stafford  Cholecalciferol (VITAMIN D PO) Take 5,000 Units by mouth daily. OTC, strength not known    Yes Provider, Historical, Stafford  conjugated estrogens (PREMARIN) vaginal cream Place pea-sized amount of cream to the vagina every night for  2 weeks and then 2 times a week thereafter. 09/09/19  Yes Provider, Historical, Stafford  DUREZOL 0.05 % EMUL Inject 1 drop into the eye daily. 12/06/16  Yes Provider, Historical, Stafford  Elastic Bandages & Supports (BACK SUPPORT) [place]  11/11/17  Yes Provider, Historical, Stafford  Incontinence Supply Disposable (ASSURANCE UNDERWEAR WOMENS) MISC  11/11/17  Yes Provider, Historical, Stafford  lisinopril (ZESTRIL) 20 MG tablet Take 1 tablet (20 mg total) by mouth daily. 01/13/20  Yes Leah Stafford  Multiple Vitamin (MULTIVITAMIN WITH MINERALS) TABS tablet Take 1 tablet  by mouth daily.   Yes Provider, Historical, Stafford  vitamin C (ASCORBIC ACID) 500 MG tablet Take 500 mg by mouth daily.   Yes Provider, Historical, Stafford  aspirin 81 MG tablet Take 81 mg by mouth daily.     Provider, Historical, Stafford     Depression screen [facility] 2/9 04/13/2020 01/13/2020 07/15/2019 03/23/2019 03/02/2019  Decreased Interest 0 0 0 0 0  Down, Depressed, Hopeless 0 0 0 0 0  PHQ - 2 Score 0 0 0 0 0     Fall Risk  04/13/2020 01/13/2020 07/15/2019 03/23/2019 03/02/2019  Falls in the past year? 0 0 0 0 0  Number falls in past yr: 0 0 0 - 0  Injury with Fall? 0 0 0 - 0  Follow up Falls evaluation completed - Falls evaluation completed Falls evaluation completed -      PHYSICAL EXAM: BP (!) 144/70 Comment: taken from a previous visit  Ht 5' 1.18" (1.554 m)   Wt 138 lb (62.6 kg)   BMI 25.92 kg/m    Wt Readings from Last 3 Encounters:  04/13/20 138 lb (62.6 kg)  01/13/20 138 lb (62.6 kg)  07/15/19 130 lb 12.8 oz (59.3 kg)       Education/Counseling provided regarding diet and exercise, prevention of chronic diseases, smoking/tobacco cessation, if applicable, and reviewed "Covered Medicare Preventive Services."       r

## 2020-05-03 ENCOUNTER — Other Ambulatory Visit: Payer: Self-pay

## 2020-05-03 ENCOUNTER — Encounter: Payer: Self-pay | Admitting: Podiatry

## 2020-05-03 ENCOUNTER — Ambulatory Visit (INDEPENDENT_AMBULATORY_CARE_PROVIDER_SITE_OTHER): Payer: Medicare HMO | Admitting: Podiatry

## 2020-05-03 DIAGNOSIS — M79675 Pain in left toe(s): Secondary | ICD-10-CM

## 2020-05-03 DIAGNOSIS — M79674 Pain in right toe(s): Secondary | ICD-10-CM

## 2020-05-03 DIAGNOSIS — E119 Type 2 diabetes mellitus without complications: Secondary | ICD-10-CM

## 2020-05-03 DIAGNOSIS — R6889 Other general symptoms and signs: Secondary | ICD-10-CM | POA: Diagnosis not present

## 2020-05-03 DIAGNOSIS — B351 Tinea unguium: Secondary | ICD-10-CM

## 2020-05-03 NOTE — Progress Notes (Signed)
This patient returns to my office for at risk foot care.  This patient requires this care by a professional since this patient will be at risk due to having diabetes and kidney disease.   This patient is unable to cut nails herself since the patient cannot reach her nails.These nails are painful walking and wearing shoes.  This patient presents for at risk foot care today.  General Appearance  Alert, conversant and in no acute stress.  Vascular  Dorsalis pedis and posterior tibial  pulses are palpable  bilaterally.  Capillary return is within normal limits  bilaterally. Temperature is within normal limits  bilaterally.  Neurologic  Senn-Weinstein monofilament wire test within normal limits  bilaterally. Muscle power within normal limits bilaterally.  Nails Thick disfigured discolored nails with subungual debris  from hallux to fifth toes bilaterally. No evidence of bacterial infection or drainage bilaterally.  Orthopedic  No limitations of motion  feet .  No crepitus or effusions noted.  HAV  B/L.  HT 2,3  B/L.  Skin  normotropic skin with no porokeratosis noted bilaterally.  No signs of infections or ulcers noted.     Onychomycosis  Pain in right toes  Pain in left toes  Consent was obtained for treatment procedures.   Mechanical debridement of nails 1-5  bilaterally performed with a nail nipper.  Filed with dremel without incident.    Return office visit   10 weeks                   Told patient to return for periodic foot care and evaluation due to potential at risk complications.   Gardiner Barefoot DPM

## 2020-06-15 DIAGNOSIS — M150 Primary generalized (osteo)arthritis: Secondary | ICD-10-CM | POA: Diagnosis not present

## 2020-06-15 DIAGNOSIS — I10 Essential (primary) hypertension: Secondary | ICD-10-CM | POA: Diagnosis not present

## 2020-07-12 ENCOUNTER — Other Ambulatory Visit: Payer: Self-pay

## 2020-07-12 ENCOUNTER — Ambulatory Visit (INDEPENDENT_AMBULATORY_CARE_PROVIDER_SITE_OTHER): Payer: Medicare HMO | Admitting: Podiatry

## 2020-07-12 ENCOUNTER — Encounter: Payer: Self-pay | Admitting: Podiatry

## 2020-07-12 DIAGNOSIS — B351 Tinea unguium: Secondary | ICD-10-CM | POA: Diagnosis not present

## 2020-07-12 DIAGNOSIS — E119 Type 2 diabetes mellitus without complications: Secondary | ICD-10-CM

## 2020-07-12 DIAGNOSIS — M79675 Pain in left toe(s): Secondary | ICD-10-CM | POA: Diagnosis not present

## 2020-07-12 DIAGNOSIS — M79674 Pain in right toe(s): Secondary | ICD-10-CM

## 2020-07-12 NOTE — Progress Notes (Signed)
This patient returns to my office for at risk foot care.  This patient requires this care by a professional since this patient will be at risk due to having diabetes and kidney disease.   This patient is unable to cut nails herself since the patient cannot reach her nails.These nails are painful walking and wearing shoes.  This patient presents for at risk foot care today.  General Appearance  Alert, conversant and in no acute stress.  Vascular  Dorsalis pedis and posterior tibial  pulses are palpable  bilaterally.  Capillary return is within normal limits  bilaterally. Temperature is within normal limits  bilaterally.  Neurologic  Senn-Weinstein monofilament wire test within normal limits  bilaterally. Muscle power within normal limits bilaterally.  Nails Thick disfigured discolored nails with subungual debris  from hallux to fifth toes bilaterally. No evidence of bacterial infection or drainage bilaterally.  Orthopedic  No limitations of motion  feet .  No crepitus or effusions noted.  HAV  B/L.  HT 2,3  B/L.  Skin  normotropic skin with no porokeratosis noted bilaterally.  No signs of infections or ulcers noted.     Onychomycosis  Pain in right toes  Pain in left toes  Consent was obtained for treatment procedures.   Mechanical debridement of nails 1-5  bilaterally performed with a nail nipper.  Filed with dremel without incident.    Return office visit   10 weeks                   Told patient to return for periodic foot care and evaluation due to potential at risk complications.   Gardiner Barefoot DPM

## 2020-07-20 DIAGNOSIS — Z4689 Encounter for fitting and adjustment of other specified devices: Secondary | ICD-10-CM | POA: Diagnosis not present

## 2020-07-20 DIAGNOSIS — N813 Complete uterovaginal prolapse: Secondary | ICD-10-CM | POA: Diagnosis not present

## 2020-07-20 DIAGNOSIS — R6889 Other general symptoms and signs: Secondary | ICD-10-CM | POA: Diagnosis not present

## 2020-07-20 DIAGNOSIS — N952 Postmenopausal atrophic vaginitis: Secondary | ICD-10-CM | POA: Diagnosis not present

## 2020-08-22 DIAGNOSIS — Z961 Presence of intraocular lens: Secondary | ICD-10-CM | POA: Diagnosis not present

## 2020-08-22 DIAGNOSIS — R6889 Other general symptoms and signs: Secondary | ICD-10-CM | POA: Diagnosis not present

## 2020-08-22 DIAGNOSIS — H35371 Puckering of macula, right eye: Secondary | ICD-10-CM | POA: Diagnosis not present

## 2020-08-22 DIAGNOSIS — E113553 Type 2 diabetes mellitus with stable proliferative diabetic retinopathy, bilateral: Secondary | ICD-10-CM | POA: Diagnosis not present

## 2020-09-12 DIAGNOSIS — R6889 Other general symptoms and signs: Secondary | ICD-10-CM | POA: Diagnosis not present

## 2020-09-12 DIAGNOSIS — H903 Sensorineural hearing loss, bilateral: Secondary | ICD-10-CM | POA: Diagnosis not present

## 2020-09-20 DIAGNOSIS — H903 Sensorineural hearing loss, bilateral: Secondary | ICD-10-CM | POA: Diagnosis not present

## 2020-09-20 DIAGNOSIS — R6889 Other general symptoms and signs: Secondary | ICD-10-CM | POA: Diagnosis not present

## 2020-09-21 ENCOUNTER — Ambulatory Visit: Payer: Medicare HMO | Admitting: Podiatry

## 2020-10-03 ENCOUNTER — Other Ambulatory Visit: Payer: Self-pay

## 2020-10-03 ENCOUNTER — Ambulatory Visit (INDEPENDENT_AMBULATORY_CARE_PROVIDER_SITE_OTHER): Payer: Medicare HMO | Admitting: Podiatry

## 2020-10-03 ENCOUNTER — Encounter: Payer: Self-pay | Admitting: Podiatry

## 2020-10-03 DIAGNOSIS — M79675 Pain in left toe(s): Secondary | ICD-10-CM

## 2020-10-03 DIAGNOSIS — R6889 Other general symptoms and signs: Secondary | ICD-10-CM | POA: Diagnosis not present

## 2020-10-03 DIAGNOSIS — M79674 Pain in right toe(s): Secondary | ICD-10-CM | POA: Diagnosis not present

## 2020-10-03 DIAGNOSIS — B351 Tinea unguium: Secondary | ICD-10-CM | POA: Diagnosis not present

## 2020-10-03 DIAGNOSIS — E119 Type 2 diabetes mellitus without complications: Secondary | ICD-10-CM

## 2020-10-04 DIAGNOSIS — R6889 Other general symptoms and signs: Secondary | ICD-10-CM | POA: Diagnosis not present

## 2020-10-05 NOTE — Progress Notes (Signed)
Subjective:   Patient ID: Leah Stafford, female   DOB: 86 y.o.   MRN: [id number]   HPI Patient presents with thick yellow brittle nailbeds 1-5 both feet that get incurvated in the corners and make shoe gear difficult and she cannot cut   ROS      Objective:  Physical Exam  Thick yellow brittle nailbeds 1-5 both feet ingrown in the corners painful when pressed dorsally     Assessment:  Chronic mycotic nail infection 1-5 both feet with pain     Plan:  Debridement nailbeds 1-5 both feet no iatrogenic bleeding reappoint routine

## 2020-10-12 DIAGNOSIS — Z713 Dietary counseling and surveillance: Secondary | ICD-10-CM | POA: Diagnosis not present

## 2020-10-12 DIAGNOSIS — Z7182 Exercise counseling: Secondary | ICD-10-CM | POA: Diagnosis not present

## 2020-10-12 DIAGNOSIS — I10 Essential (primary) hypertension: Secondary | ICD-10-CM | POA: Diagnosis not present

## 2020-10-12 DIAGNOSIS — Z79899 Other long term (current) drug therapy: Secondary | ICD-10-CM | POA: Diagnosis not present

## 2020-10-12 DIAGNOSIS — N952 Postmenopausal atrophic vaginitis: Secondary | ICD-10-CM | POA: Diagnosis not present

## 2020-10-18 DIAGNOSIS — R6889 Other general symptoms and signs: Secondary | ICD-10-CM | POA: Diagnosis not present

## 2020-11-08 DIAGNOSIS — R6889 Other general symptoms and signs: Secondary | ICD-10-CM | POA: Diagnosis not present

## 2020-11-15 DIAGNOSIS — R6889 Other general symptoms and signs: Secondary | ICD-10-CM | POA: Diagnosis not present

## 2020-12-13 ENCOUNTER — Ambulatory Visit: Payer: Medicare HMO | Admitting: Podiatry

## 2020-12-23 DIAGNOSIS — I10 Essential (primary) hypertension: Secondary | ICD-10-CM | POA: Diagnosis not present

## 2020-12-23 DIAGNOSIS — R6889 Other general symptoms and signs: Secondary | ICD-10-CM | POA: Diagnosis not present

## 2021-01-06 DIAGNOSIS — R6889 Other general symptoms and signs: Secondary | ICD-10-CM | POA: Diagnosis not present

## 2021-01-11 ENCOUNTER — Ambulatory Visit (INDEPENDENT_AMBULATORY_CARE_PROVIDER_SITE_OTHER): Payer: Medicare HMO | Admitting: Podiatry

## 2021-01-11 ENCOUNTER — Encounter: Payer: Self-pay | Admitting: Podiatry

## 2021-01-11 ENCOUNTER — Other Ambulatory Visit: Payer: Self-pay

## 2021-01-11 DIAGNOSIS — M79674 Pain in right toe(s): Secondary | ICD-10-CM

## 2021-01-11 DIAGNOSIS — M79675 Pain in left toe(s): Secondary | ICD-10-CM | POA: Diagnosis not present

## 2021-01-11 DIAGNOSIS — B351 Tinea unguium: Secondary | ICD-10-CM | POA: Diagnosis not present

## 2021-01-11 DIAGNOSIS — R6889 Other general symptoms and signs: Secondary | ICD-10-CM | POA: Diagnosis not present

## 2021-01-11 DIAGNOSIS — E119 Type 2 diabetes mellitus without complications: Secondary | ICD-10-CM

## 2021-01-11 NOTE — Progress Notes (Signed)
This patient returns to my office for at risk foot care.  This patient requires this care by a professional since this patient will be at risk due to having diabetes and kidney disease.   This patient is unable to cut nails herself since the patient cannot reach her nails.These nails are painful walking and wearing shoes.  This patient presents for at risk foot care today.  General Appearance  Alert, conversant and in no acute stress.  Vascular  Dorsalis pedis and posterior tibial  pulses are palpable  bilaterally.  Capillary return is within normal limits  bilaterally. Temperature is within normal limits  bilaterally.  Neurologic  Senn-Weinstein monofilament wire test within normal limits  bilaterally. Muscle power within normal limits bilaterally.  Nails Thick disfigured discolored nails with subungual debris  from hallux to fifth toes bilaterally. No evidence of bacterial infection or drainage bilaterally.  Orthopedic  No limitations of motion  feet .  No crepitus or effusions noted.  HAV  B/L.  HT 2,3  B/L.  Skin  normotropic skin with no porokeratosis noted bilaterally.  No signs of infections or ulcers noted.     Onychomycosis  Pain in right toes  Pain in left toes  Consent was obtained for treatment procedures.   Mechanical debridement of nails 1-5  bilaterally performed with a nail nipper.  Filed with dremel without incident.    Return office visit   12 weeks                   Told patient to return for periodic foot care and evaluation due to potential at risk complications.   Gardiner Barefoot DPM

## 2021-01-17 DIAGNOSIS — R6889 Other general symptoms and signs: Secondary | ICD-10-CM | POA: Diagnosis not present

## 2021-01-19 DIAGNOSIS — R6889 Other general symptoms and signs: Secondary | ICD-10-CM | POA: Diagnosis not present

## 2021-01-25 DIAGNOSIS — N952 Postmenopausal atrophic vaginitis: Secondary | ICD-10-CM | POA: Diagnosis not present

## 2021-01-25 DIAGNOSIS — N813 Complete uterovaginal prolapse: Secondary | ICD-10-CM | POA: Diagnosis not present

## 2021-01-25 DIAGNOSIS — R6889 Other general symptoms and signs: Secondary | ICD-10-CM | POA: Diagnosis not present

## 2021-01-25 DIAGNOSIS — Z4689 Encounter for fitting and adjustment of other specified devices: Secondary | ICD-10-CM | POA: Diagnosis not present

## 2021-03-29 DIAGNOSIS — I10 Essential (primary) hypertension: Secondary | ICD-10-CM | POA: Diagnosis not present

## 2021-03-29 DIAGNOSIS — Z79899 Other long term (current) drug therapy: Secondary | ICD-10-CM | POA: Diagnosis not present

## 2021-03-29 DIAGNOSIS — E119 Type 2 diabetes mellitus without complications: Secondary | ICD-10-CM | POA: Diagnosis not present

## 2021-04-12 ENCOUNTER — Other Ambulatory Visit: Payer: Self-pay

## 2021-04-12 ENCOUNTER — Ambulatory Visit (INDEPENDENT_AMBULATORY_CARE_PROVIDER_SITE_OTHER): Payer: Medicare HMO | Admitting: Podiatry

## 2021-04-12 ENCOUNTER — Encounter: Payer: Self-pay | Admitting: Podiatry

## 2021-04-12 DIAGNOSIS — M79675 Pain in left toe(s): Secondary | ICD-10-CM

## 2021-04-12 DIAGNOSIS — B351 Tinea unguium: Secondary | ICD-10-CM | POA: Diagnosis not present

## 2021-04-12 DIAGNOSIS — N289 Disorder of kidney and ureter, unspecified: Secondary | ICD-10-CM | POA: Diagnosis not present

## 2021-04-12 DIAGNOSIS — M79674 Pain in right toe(s): Secondary | ICD-10-CM

## 2021-04-12 DIAGNOSIS — R6889 Other general symptoms and signs: Secondary | ICD-10-CM | POA: Diagnosis not present

## 2021-04-12 DIAGNOSIS — E119 Type 2 diabetes mellitus without complications: Secondary | ICD-10-CM | POA: Diagnosis not present

## 2021-04-12 NOTE — Progress Notes (Signed)
This patient returns to my office for at risk foot care.  This patient requires this care by a professional since this patient will be at risk due to having diabetes and kidney disease.   This patient is unable to cut nails herself since the patient cannot reach her nails.These nails are painful walking and wearing shoes.  This patient presents for at risk foot care today.  General Appearance  Alert, conversant and in no acute stress.  Vascular  Dorsalis pedis and posterior tibial  pulses are palpable  bilaterally.  Capillary return is within normal limits  bilaterally. Temperature is within normal limits  bilaterally.  Neurologic  Senn-Weinstein monofilament wire test within normal limits  bilaterally. Muscle power within normal limits bilaterally.  Nails Thick disfigured discolored nails with subungual debris  from hallux to fifth toes bilaterally. No evidence of bacterial infection or drainage bilaterally.  Orthopedic  No limitations of motion  feet .  No crepitus or effusions noted.  HAV  B/L.  HT 2,3  B/L.  Skin  normotropic skin with no porokeratosis noted bilaterally.  No signs of infections or ulcers noted.     Onychomycosis  Pain in right toes  Pain in left toes  Consent was obtained for treatment procedures.   Mechanical debridement of nails 1-5  bilaterally performed with a nail nipper.  Filed with dremel without incident.    Return office visit   10 weeks                   Told patient to return for periodic foot care and evaluation due to potential at risk complications.   Gardiner Barefoot DPM

## 2021-06-21 ENCOUNTER — Ambulatory Visit (INDEPENDENT_AMBULATORY_CARE_PROVIDER_SITE_OTHER): Payer: Medicare HMO | Admitting: Podiatry

## 2021-06-21 ENCOUNTER — Other Ambulatory Visit: Payer: Self-pay

## 2021-06-21 ENCOUNTER — Encounter: Payer: Self-pay | Admitting: Podiatry

## 2021-06-21 DIAGNOSIS — E119 Type 2 diabetes mellitus without complications: Secondary | ICD-10-CM | POA: Diagnosis not present

## 2021-06-21 DIAGNOSIS — M79674 Pain in right toe(s): Secondary | ICD-10-CM

## 2021-06-21 DIAGNOSIS — B351 Tinea unguium: Secondary | ICD-10-CM | POA: Diagnosis not present

## 2021-06-21 DIAGNOSIS — N289 Disorder of kidney and ureter, unspecified: Secondary | ICD-10-CM | POA: Diagnosis not present

## 2021-06-21 DIAGNOSIS — M79675 Pain in left toe(s): Secondary | ICD-10-CM

## 2021-06-21 DIAGNOSIS — R6889 Other general symptoms and signs: Secondary | ICD-10-CM | POA: Diagnosis not present

## 2021-06-21 NOTE — Progress Notes (Signed)
This patient returns to my office for at risk foot care.  This patient requires this care by a professional since this patient will be at risk due to having diabetes and kidney disease.   This patient is unable to cut nails herself since the patient cannot reach her nails.These nails are painful walking and wearing shoes.  This patient presents for at risk foot care today.  General Appearance  Alert, conversant and in no acute stress.  Vascular  Dorsalis pedis and posterior tibial  pulses are palpable  bilaterally.  Capillary return is within normal limits  bilaterally. Temperature is within normal limits  bilaterally.  Neurologic  Senn-Weinstein monofilament wire test within normal limits  bilaterally. Muscle power within normal limits bilaterally.  Nails Thick disfigured discolored nails with subungual debris  from hallux to fifth toes bilaterally. No evidence of bacterial infection or drainage bilaterally.  Orthopedic  No limitations of motion  feet .  No crepitus or effusions noted.  HAV  B/L.  HT 2,3  B/L.  Skin  normotropic skin with no porokeratosis noted bilaterally.  No signs of infections or ulcers noted.     Onychomycosis  Pain in right toes  Pain in left toes  Consent was obtained for treatment procedures.   Mechanical debridement of nails 1-5  bilaterally performed with a nail nipper.  Filed with dremel without incident.  Patient is wearing nail polish which is fading except for certain toenails.   Return office visit   10 weeks                   Told patient to return for periodic foot care and evaluation due to potential at risk complications.   Gardiner Barefoot DPM

## 2021-07-27 DIAGNOSIS — R6889 Other general symptoms and signs: Secondary | ICD-10-CM | POA: Diagnosis not present

## 2021-08-02 DIAGNOSIS — Z4689 Encounter for fitting and adjustment of other specified devices: Secondary | ICD-10-CM | POA: Diagnosis not present

## 2021-08-02 DIAGNOSIS — R6889 Other general symptoms and signs: Secondary | ICD-10-CM | POA: Diagnosis not present

## 2021-08-23 ENCOUNTER — Encounter: Payer: Self-pay | Admitting: Podiatry

## 2021-08-23 ENCOUNTER — Other Ambulatory Visit: Payer: Self-pay

## 2021-08-23 ENCOUNTER — Ambulatory Visit (INDEPENDENT_AMBULATORY_CARE_PROVIDER_SITE_OTHER): Payer: Medicare HMO | Admitting: Podiatry

## 2021-08-23 DIAGNOSIS — M79675 Pain in left toe(s): Secondary | ICD-10-CM | POA: Diagnosis not present

## 2021-08-23 DIAGNOSIS — E119 Type 2 diabetes mellitus without complications: Secondary | ICD-10-CM | POA: Diagnosis not present

## 2021-08-23 DIAGNOSIS — M79674 Pain in right toe(s): Secondary | ICD-10-CM

## 2021-08-23 DIAGNOSIS — B351 Tinea unguium: Secondary | ICD-10-CM | POA: Diagnosis not present

## 2021-08-23 DIAGNOSIS — N289 Disorder of kidney and ureter, unspecified: Secondary | ICD-10-CM | POA: Diagnosis not present

## 2021-08-23 DIAGNOSIS — R6889 Other general symptoms and signs: Secondary | ICD-10-CM | POA: Diagnosis not present

## 2021-08-23 NOTE — Progress Notes (Signed)
This patient returns to my office for at risk foot care.  This patient requires this care by a professional since this patient will be at risk due to having diabetes and kidney disease.   This patient is unable to cut nails herself since the patient cannot reach her nails.These nails are painful walking and wearing shoes.  This patient presents for at risk foot care today.  General Appearance  Alert, conversant and in no acute stress.  Vascular  Dorsalis pedis and posterior tibial  pulses are palpable  bilaterally.  Capillary return is within normal limits  bilaterally. Temperature is within normal limits  bilaterally.  Neurologic  Senn-Weinstein monofilament wire test within normal limits  bilaterally. Muscle power within normal limits bilaterally.  Nails Thick disfigured discolored nails with subungual debris  from hallux to fifth toes bilaterally. No evidence of bacterial infection or drainage bilaterally.  Orthopedic  No limitations of motion  feet .  No crepitus or effusions noted.  HAV  B/L.  HT 2,3  B/L.  Skin  normotropic skin with no porokeratosis noted bilaterally.  No signs of infections or ulcers noted.     Onychomycosis  Pain in right toes  Pain in left toes  Consent was obtained for treatment procedures.   Mechanical debridement of nails 1-5  bilaterally performed with a nail nipper.  Filed with dremel without incident.  Patient is wearing nail polish which is fading except for certain toenails.   Return office visit   10 weeks                   Told patient to return for periodic foot care and evaluation due to potential at risk complications.   Gardiner Barefoot DPM

## 2021-11-06 ENCOUNTER — Ambulatory Visit: Payer: Medicare HMO | Admitting: Podiatry

## 2021-11-06 DIAGNOSIS — R6889 Other general symptoms and signs: Secondary | ICD-10-CM | POA: Diagnosis not present

## 2021-11-08 ENCOUNTER — Telehealth: Payer: Self-pay | Admitting: Family Medicine

## 2021-11-08 NOTE — Telephone Encounter (Signed)
LVM for pt to rtn my call to schedule AWV with [facility]. Please schedule AWV if pt calls the office  

## 2021-11-21 ENCOUNTER — Ambulatory Visit (INDEPENDENT_AMBULATORY_CARE_PROVIDER_SITE_OTHER): Payer: Medicare HMO | Admitting: Internal Medicine

## 2021-11-21 ENCOUNTER — Other Ambulatory Visit: Payer: Self-pay

## 2021-11-21 ENCOUNTER — Encounter: Payer: Self-pay | Admitting: Internal Medicine

## 2021-11-21 VITALS — BP 166/68 | HR 60 | Temp 98.2°F | Resp 16 | Ht 61.0 in | Wt 151.0 lb

## 2021-11-21 DIAGNOSIS — Z0001 Encounter for general adult medical examination with abnormal findings: Secondary | ICD-10-CM | POA: Diagnosis not present

## 2021-11-21 DIAGNOSIS — N289 Disorder of kidney and ureter, unspecified: Secondary | ICD-10-CM | POA: Diagnosis not present

## 2021-11-21 DIAGNOSIS — N184 Chronic kidney disease, stage 4 (severe): Secondary | ICD-10-CM

## 2021-11-21 DIAGNOSIS — R6889 Other general symptoms and signs: Secondary | ICD-10-CM | POA: Diagnosis not present

## 2021-11-21 DIAGNOSIS — Z23 Encounter for immunization: Secondary | ICD-10-CM | POA: Diagnosis not present

## 2021-11-21 DIAGNOSIS — I10 Essential (primary) hypertension: Secondary | ICD-10-CM | POA: Diagnosis not present

## 2021-11-21 DIAGNOSIS — D539 Nutritional anemia, unspecified: Secondary | ICD-10-CM | POA: Diagnosis not present

## 2021-11-21 DIAGNOSIS — Z794 Long term (current) use of insulin: Secondary | ICD-10-CM | POA: Diagnosis not present

## 2021-11-21 DIAGNOSIS — E113593 Type 2 diabetes mellitus with proliferative diabetic retinopathy without macular edema, bilateral: Secondary | ICD-10-CM | POA: Diagnosis not present

## 2021-11-21 LAB — CBC WITH DIFFERENTIAL/PLATELET
Basophils Absolute: 0 10*3/uL (ref 0.0–0.1)
Basophils Relative: 0.8 % (ref 0.0–3.0)
Eosinophils Absolute: 0.2 10*3/uL (ref 0.0–0.7)
Eosinophils Relative: 5.1 % — ABNORMAL HIGH (ref 0.0–5.0)
HCT: 34.7 % — ABNORMAL LOW (ref 36.0–46.0)
Hemoglobin: 11.6 g/dL — ABNORMAL LOW (ref 12.0–15.0)
Lymphocytes Relative: 38 % (ref 12.0–46.0)
Lymphs Abs: 1.7 10*3/uL (ref 0.7–4.0)
MCHC: 33.4 g/dL (ref 30.0–36.0)
MCV: 95.2 fl (ref 78.0–100.0)
Monocytes Absolute: 0.4 10*3/uL (ref 0.1–1.0)
Monocytes Relative: 9.9 % (ref 3.0–12.0)
Neutro Abs: 2.1 10*3/uL (ref 1.4–7.7)
Neutrophils Relative %: 46.2 % (ref 43.0–77.0)
Platelets: 232 10*3/uL (ref 150.0–400.0)
RBC: 3.64 Mil/uL — ABNORMAL LOW (ref 3.87–5.11)
RDW: 13.6 % (ref 11.5–15.5)
WBC: 4.5 10*3/uL (ref 4.0–10.5)

## 2021-11-21 LAB — HEPATIC FUNCTION PANEL
ALT: 13 U/L (ref 0–35)
AST: 23 U/L (ref 0–37)
Albumin: 3.9 g/dL (ref 3.5–5.2)
Alkaline Phosphatase: 63 U/L (ref 39–117)
Bilirubin, Direct: 0.2 mg/dL (ref 0.0–0.3)
Total Bilirubin: 0.9 mg/dL (ref 0.2–1.2)
Total Protein: 7.5 g/dL (ref 6.0–8.3)

## 2021-11-21 LAB — BASIC METABOLIC PANEL
BUN: 10 mg/dL (ref 6–23)
CO2: 29 mEq/L (ref 19–32)
Calcium: 9.6 mg/dL (ref 8.4–10.5)
Chloride: 102 mEq/L (ref 96–112)
Creatinine, Ser: 1.27 mg/dL — ABNORMAL HIGH (ref 0.40–1.20)
GFR: 38.5 mL/min — ABNORMAL LOW (ref >60.00)
Glucose, Bld: 99 mg/dL (ref 70–99)
Potassium: 3.8 mEq/L (ref 3.5–5.1)
Sodium: 138 mEq/L (ref 135–145)

## 2021-11-21 LAB — TSH: TSH: 1.38 u[IU]/mL (ref 0.35–5.50)

## 2021-11-21 LAB — URINALYSIS, ROUTINE W REFLEX MICROSCOPIC
Bilirubin Urine: NEGATIVE
Hgb urine dipstick: NEGATIVE
Ketones, ur: NEGATIVE
Leukocytes,Ua: NEGATIVE
Nitrite: NEGATIVE
RBC / HPF: NONE SEEN (ref 0–?)
Specific Gravity, Urine: <=1.005 — AB (ref 1.000–1.030)
Total Protein, Urine: NEGATIVE
Urine Glucose: NEGATIVE
Urobilinogen, UA: 0.2 (ref 0.0–1.0)
pH: 6.5 (ref 5.0–8.0)

## 2021-11-21 LAB — IBC + FERRITIN
Ferritin: 22 ng/mL (ref 10.0–291.0)
Iron: 103 ug/dL (ref 42–145)
Saturation Ratios: 31 % (ref 20.0–50.0)
TIBC: 331.8 ug/dL (ref 250.0–450.0)
Transferrin: 237 mg/dL (ref 212.0–360.0)

## 2021-11-21 LAB — FOLATE: Folate: 8.8 ng/mL (ref >5.9)

## 2021-11-21 LAB — VITAMIN B12: Vitamin B-12: 736 pg/mL (ref 211–911)

## 2021-11-21 LAB — HEMOGLOBIN A1C: Hgb A1c MFr Bld: 5.9 % (ref 4.6–6.5)

## 2021-11-21 MED ORDER — BOOSTRIX 5-2.5-18.5 LF-MCG/0.5 IM SUSP: 0.5000 mL | Freq: Once | INTRAMUSCULAR | 0 refills | Status: AC

## 2021-11-21 NOTE — Patient Instructions (Signed)
Anemia °Anemia is a condition in which there is not enough red blood cells or hemoglobin in the blood. Hemoglobin is a substance in red blood cells that carries oxygen. °When you do not have enough red blood cells or hemoglobin (are anemic), your body cannot get enough oxygen and your organs may not work properly. As a result, you may feel very tired or have other problems. °What are the causes? °Common causes of anemia include: °Excessive bleeding. Anemia can be caused by excessive bleeding inside or outside the body, including bleeding from the intestines or from heavy menstrual periods in females. °Poor nutrition. °Long-lasting (chronic) kidney, thyroid, and liver disease. °Bone marrow disorders, spleen problems, and blood disorders. °Cancer and treatments for cancer. °HIV (human immunodeficiency virus) and AIDS (acquired immunodeficiency syndrome). °Infections, medicines, and autoimmune disorders that destroy red blood cells. °What are the signs or symptoms? °Symptoms of this condition include: °Minor weakness. °Dizziness. °Headache, or difficulties concentrating and sleeping. °Heartbeats that feel irregular or faster than normal (palpitations). °Shortness of breath, especially with exercise. °Pale skin, lips, and nails, or cold hands and feet. °Indigestion and nausea. °Symptoms may occur suddenly or develop slowly. If your anemia is mild, you may not have symptoms. °How is this diagnosed? °This condition is diagnosed based on blood tests, your medical history, and a physical exam. In some cases, a test may be needed in which cells are removed from the soft tissue inside of a bone and looked at under a microscope (bone marrow biopsy). Your health care provider may also check your stool (feces) for blood and may do additional testing to look for the cause of your bleeding. °Other tests may include: °Imaging tests, such as a CT scan or MRI. °A procedure to see inside your esophagus and stomach (endoscopy). °A  procedure to see inside your colon and rectum (colonoscopy). °How is this treated? °Treatment for this condition depends on the cause. If you continue to lose a lot of blood, you may need to be treated at a hospital. Treatment may include: °Taking supplements of iron, vitamin B12, or folic acid. °Taking a hormone medicine (erythropoietin) that can help to stimulate red blood cell growth. °Having a blood transfusion. This may be needed if you lose a lot of blood. °Making changes to your diet. °Having surgery to remove your spleen. °Follow these instructions at home: °Take over-the-counter and prescription medicines only as told by your health care provider. °Take supplements only as told by your health care provider. °Follow any diet instructions that you were given by your health care provider. °Keep all follow-up visits as told by your health care provider. This is important. °Contact a health care provider if: °You develop new bleeding anywhere in the body. °Get help right away if: °You are very weak. °You are short of breath. °You have pain in your abdomen or chest. °You are dizzy or feel faint. °You have trouble concentrating. °You have bloody stools, black stools, or tarry stools. °You vomit repeatedly or you vomit up blood. °These symptoms may represent a serious problem that is an emergency. Do not wait to see if the symptoms will go away. Get medical help right away. Call your local emergency services (911 in the [place] Do not drive yourself to the hospital. °Summary °Anemia is a condition in which you do not have enough red blood cells or enough of a substance in your red blood cells that carries oxygen (hemoglobin). °Symptoms may occur suddenly or develop slowly. °If your anemia is   mild, you may not have symptoms. °This condition is diagnosed with blood tests, a medical history, and a physical exam. Other tests may be needed. °Treatment for this condition depends on the cause of the anemia. °This  information is not intended to replace advice given to you by your health care provider. Make sure you discuss any questions you have with your health care provider. °Document Revised: 10/20/2019 Document Reviewed: 10/20/2019 °Elsevier Patient Education © 2022 [employer] Inc. ° °

## 2021-11-21 NOTE — Progress Notes (Signed)
Subjective:   Leah Stafford is a 87 y.o. female who presents for Medicare Annual (Subsequent) preventive examination.  I connected with Leah Stafford today by telephone and verified that I am speaking with the correct person using two identifiers. Location patient: home Location provider: work Persons participating in the virtual visit: patient, Marine scientist.    I discussed the limitations, risks, security and privacy concerns of performing an evaluation and management service by telephone and the availability of in person appointments. I also discussed with the patient that there may be a patient responsible charge related to this service. The patient expressed understanding and verbally consented to this telephonic visit.    Interactive audio and video telecommunications were attempted between this provider and patient, however failed, due to patient having technical difficulties OR patient did not have access to video capability.  We continued and completed visit with audio only.  Some vital signs may be absent or patient reported.   Time Spent with patient on telephone encounter: 20 minutes  Review of Systems     Cardiac Risk Factors include: diabetes mellitus;advanced age (>77men, >82 women);hypertension     Objective:    Today's Vitals   11/23/21 1357  Weight: 151 lb (68.5 kg)  Height: 5\' 1"  (1.549 m)   Body mass index is 28.53 kg/m.  Advanced Directives 11/23/2021 04/13/2020 03/02/2019  Does Patient Have a Medical Advance Directive? Yes Yes No  Type of Paramedic of [place];Living will [place] -  Does patient want to make changes to medical advance directive? Yes (MAU/Ambulatory/Procedural Areas - Information given) No - Patient declined Yes (ED - Information included in AVS)  Copy of [place] in Chart? - No - copy requested -    Current Medications (verified) Outpatient Encounter Medications as of  11/23/2021  Medication Sig   acetaminophen (TYLENOL) 500 MG tablet Take 2 tablets (1,000 mg total) every 8 (eight) hours as needed by mouth for moderate pain.   aspirin 81 MG tablet Take 81 mg by mouth daily.    carvedilol (COREG) 12.5 MG tablet Take 1 tablet (12.5 mg total) by mouth 2 (two) times daily with a meal.   Cholecalciferol (VITAMIN D PO) Take 5,000 Units by mouth daily. OTC, strength not known    conjugated estrogens (PREMARIN) vaginal cream Place pea-sized amount of cream to the vagina every night for 2 weeks and then 2 times a week thereafter.   DUREZOL 0.05 % EMUL Inject 1 drop into the eye daily.   Elastic Bandages & Supports (BACK SUPPORT) MISC    estradiol (ESTRACE) 0.1 MG/GM vaginal cream Insert pea-sized amount per vagina every other night   Incontinence Supply Disposable (ASSURANCE UNDERWEAR WOMENS) MISC    lisinopril (ZESTRIL) 20 MG tablet Take 1 tablet (20 mg total) by mouth daily.   Multiple Vitamin (MULTIVITAMIN WITH MINERALS) TABS tablet Take 1 tablet by mouth daily.   vitamin C (ASCORBIC ACID) 500 MG tablet Take 500 mg by mouth daily.   [EXPIRED] Tdap (BOOSTRIX) 5-2.5-18.5 LF-MCG/0.5 injection Inject 0.5 mLs into the muscle once for 1 dose.   No facility-administered encounter medications on file as of 11/23/2021.    Allergies (verified) Codeine   History: Past Medical History:  Diagnosis Date   Arthritis    Cataract    Depression    Diabetes mellitus without complication (HCC)    Diet and excercise   Diabetic retinopathy ([place])    High cholesterol    Hypertension  Past Surgical History:  Procedure Laterality Date   EYE SURGERY     TUBAL LIGATION     Family History  Problem Relation Age of Onset   Cancer Father    Hyperlipidemia Sister    Hypertension Sister    Hypertension Brother    Hyperlipidemia Brother    Heart disease Brother    Social History   Socioeconomic History   Marital status: Widowed    Spouse name: Not on file   Number of  children: 3   Years of education: Not on file   Highest education level: Not on file  Occupational History   Occupation: retired  Tobacco Use   Smoking status: Never   Smokeless tobacco: Former    Types: Snuff  Vaping Use   Vaping Use: Never used  Substance and Sexual Activity   Alcohol use: No   Drug use: No   Sexual activity: Not Currently  Other Topics Concern   Not on file  Social History Narrative   Marital status: widowed; not dating in 2018      Children: 3 children; 9 grandchildren; 8 gg.      Lives: alone; daughter five minutes per day.      Employment: retired.       Tobacco: none      Alcohol: none      Exercise: previous walking; no walking during winter months.      ADLs: no driving.  Cleans, washes clothes; cleans house; pays bills.  Grocery shopping with daughter.  No assistant devices in 2018.       Advanced Directives:  None;  FULL CODE; daughter HCPOA.         Social Determinants of Health   Financial Resource Strain: Low Risk    Difficulty of Paying Living Expenses: Not hard at all  Food Insecurity: No Food Insecurity   Worried About Charity fundraiser in the Last Year: Never true   [place] in the Last Year: Never true  Transportation Needs: No Transportation Needs   Lack of Transportation (Medical): No   Lack of Transportation (Non-Medical): No  Physical Activity: Sufficiently Active   Days of Exercise per Week: 7 days   Minutes of Exercise per Session: 40 min  Stress: No Stress Concern Present   Feeling of Stress : Not at all  Social Connections: Moderately Integrated   Frequency of Communication with Friends and Family: More than three times a week   Frequency of Social Gatherings with Friends and Family: More than three times a week   Attends Religious Services: More than 4 times per year   Active Member of [employer] or Organizations: Yes   Attends Archivist Meetings: More than 4 times per year   Marital Status: Widowed     Tobacco Counseling Counseling given: Not Answered   Clinical Intake:  Pre-visit preparation completed: Yes  Pain : No/denies pain     BMI - recorded: 28.53 Nutritional Status: BMI 25 -29 Overweight Nutritional Risks: None Diabetes: Yes CBG done?: No Did pt. bring in CBG monitor from home?: No  How often do you need to have someone help you when you read instructions, pamphlets, or other written materials from your doctor or pharmacy?: 1 - Never  Diabetes:  Is the patient diabetic?  Yes  If diabetic, was a CBG obtained today?  No  Did the patient bring in their glucometer from home?  No  How often do you monitor your CBG's? Patient  states not monitoring.   Financial Strains and Diabetes Management:  Are you having any financial strains with the device, your supplies or your medication? No .  Does the patient want to be seen by Chronic Care Management for management of their diabetes?  No  Would the patient like to be referred to a Nutritionist or for Diabetic Management?  No   Diabetic Exams:  Diabetic Eye Exam: Completed 07/2021.   Diabetic Foot Exam: Completed 06/21/21.    Interpreter Needed?: No  Information entered by :: Orrin Brigham LPN   Activities of Daily Living In your present state of health, do you have any difficulty performing the following activities: 11/23/2021 11/21/2021  Hearing? Y N  Vision? N N  Difficulty concentrating or making decisions? N N  Walking or climbing stairs? N N  Dressing or bathing? N N  Doing errands, shopping? N N  Preparing Food and eating ? N -  Using the Toilet? N -  In the past six months, have you accidently leaked urine? Y -  Comment wears pad -  Do you have problems with loss of bowel control? N -  Managing your Medications? N -  Managing your Finances? N -  Housekeeping or managing your Housekeeping? N -  Some recent data might be hidden    Patient Care Team: Janith Lima, MD as PCP - General  (Internal Medicine)  Indicate any recent Medical Services you may have received from other than [facility] providers in the past year (date may be approximate).     Assessment:   This is a routine wellness examination for [place].  Hearing/Vision screen Hearing Screening - Comments:: Patient states having a decrease in hearing, wears hearing aids occasionally Vision Screening - Comments:: Last exam 07/2021, Dr Schuyler Amor, wears glasses for reading  Dietary issues and exercise activities discussed: Current Exercise Habits: Home exercise routine, Type of exercise: walking, Time (Minutes): 45, Frequency (Times/Week): 7, Weekly Exercise (Minutes/Week): 315, Intensity: Moderate   Goals Addressed             This Visit's Progress    Patient Stated       Would like cut back on eating habits and lose weight.       Depression Screen PHQ 2/9 Scores 11/23/2021 04/13/2020 01/13/2020 07/15/2019 03/23/2019 03/02/2019 12/15/2018  PHQ - 2 Score 0 0 0 0 0 0 0    Fall Risk Fall Risk  11/23/2021 11/21/2021 04/13/2020 01/13/2020 07/15/2019  Falls in the past year? 0 0 0 0 0  Number falls in past yr: 0 - 0 0 0  Injury with Fall? 0 - 0 0 0  Risk for fall due to : No Fall Risks - - - -  Follow up Falls prevention discussed - Falls evaluation completed - Falls evaluation completed    [place]:  Any stairs in or around the home? No  If so, are there any without handrails? No  Home free of loose throw rugs in walkways, pet beds, electrical cords, etc? Yes  Adequate lighting in your home to reduce risk of falls? Yes   ASSISTIVE DEVICES UTILIZED TO PREVENT FALLS:  Life alert? No  Use of a cane, walker or w/c? No  Grab bars in the bathroom? Yes  Shower chair or bench in shower? No  Elevated toilet seat or a handicapped toilet? Yes   TIMED UP AND GO:  Was the test performed? No .   Cognitive Function:  Normal cognitive status assessed by  this Nurse Health Advisor. No  abnormalities found.   6CIT Screen 04/13/2020 03/02/2019  What Year? 0 points 0 points  What month? 0 points 0 points  What time? 0 points 0 points  Count back from 20 0 points 0 points  Months in reverse 0 points 0 points  Repeat phrase 0 points 0 points  Total Score 0 0    Immunizations Immunization History  Administered Date(s) Administered   Pneumococcal Conjugate-13 11/17/2014   Pneumococcal Polysaccharide-23 09/18/2013, 11/21/2021    TDAP status: Due, Education has been provided regarding the importance of this vaccine. Advised may receive this vaccine at local pharmacy or Health Dept. Aware to provide a copy of the vaccination record if obtained from local pharmacy or Health Dept. Verbalized acceptance and understanding.  Flu Vaccine status: Declined, Education has been provided regarding the importance of this vaccine but patient still declined. Advised may receive this vaccine at local pharmacy or Health Dept. Aware to provide a copy of the vaccination record if obtained from local pharmacy or Health Dept. Verbalized acceptance and understanding.  Pneumococcal vaccine status: Up to date  Covid-19 vaccine status: Declined, Education has been provided regarding the importance of this vaccine but patient still declined. Advised may receive this vaccine at local pharmacy or Health Dept.or vaccine clinic. Aware to provide a copy of the vaccination record if obtained from local pharmacy or Health Dept. Verbalized acceptance and understanding.  Qualifies for Shingles Vaccine? Yes   Zostavax completed No   Shingrix Completed?: No.    Education has been provided regarding the importance of this vaccine. Patient has been advised to call insurance company to determine out of pocket expense if they have not yet received this vaccine. Advised may also receive vaccine at local pharmacy or Health Dept. Verbalized acceptance and understanding.  Screening Tests Health Maintenance  Topic Date  Due   COVID-19 Vaccine (1) Never done   TETANUS/TDAP  Never done   Zoster Vaccines- Shingrix (1 of 2) Never done   DEXA SCAN  Never done   OPHTHALMOLOGY EXAM  07/13/2020   INFLUENZA VACCINE  Never done   HEMOGLOBIN A1C  05/22/2022   FOOT EXAM  06/21/2022   Pneumonia Vaccine 4+ Years old  Completed   HPV VACCINES  Aged Out    Health Maintenance  Health Maintenance Due  Topic Date Due   COVID-19 Vaccine (1) Never done   TETANUS/TDAP  Never done   Zoster Vaccines- Shingrix (1 of 2) Never done   DEXA SCAN  Never done   OPHTHALMOLOGY EXAM  07/13/2020   INFLUENZA VACCINE  Never done    Colorectal cancer screening: No longer required.   Mammogram status: patient declined today.  Bone Density status: no longer required  Lung Cancer Screening: (Low Dose CT Chest recommended if Age 29-80 years, 30 pack-year currently smoking OR have quit w/in 15years.) does not qualify.    Additional Screening:  Hepatitis C Screening: does not qualify  Vision Screening: Recommended annual ophthalmology exams for early detection of glaucoma and other disorders of the eye. Is the patient up to date with their annual eye exam?  Yes  Who is the provider or what is the name of the office in which the patient attends annual eye exams? Dr. Schuyler Amor   Dental Screening: Recommended annual dental exams for proper oral hygiene  Community Resource Referral / Chronic Care Management: CRR required this visit?  No   CCM required this visit?  No      Plan:     I have personally reviewed and noted the following in the patients chart:   Medical and social history Use of alcohol, tobacco or illicit drugs  Current medications and supplements including opioid prescriptions.  Functional ability and status Nutritional status Physical activity Advanced directives List of other physicians Hospitalizations, surgeries, and ER visits in previous 12 months Vitals Screenings to include cognitive,  depression, and falls Referrals and appointments  In addition, I have reviewed and discussed with patient certain preventive protocols, quality metrics, and best practice recommendations. A written personalized care plan for preventive services as well as general preventive health recommendations were provided to patient.   Due to this being a telephonic visit, the after visit summary with patients personalized plan was offered to patient via mail or my-chart.  Patient would like to access on my-chart.    Loma Messing, LPN 58/44/1712   Nurse Health Advisor  Nurse Notes: none

## 2021-11-21 NOTE — Progress Notes (Signed)
Subjective:  Patient ID: Leah Stafford, female    DOB: Aug 16, 1934  Age: 87 y.o. MRN: [id number]  CC: Annual Exam, Hypertension, and Anemia  This visit occurred during the SARS-CoV-2 public health emergency.  Safety protocols were in place, including screening questions prior to the visit, additional usage of staff PPE, and extensive cleaning of exam room while observing appropriate contact time as indicated for disinfecting solutions.    HPI Leah Stafford presents for a CPX and to establish.  She tells me her blood pressure has been well controlled.  She denies headache, blurred vision, chest pain, shortness of breath, edema, dizziness, or lightheadedness.  History Leah Stafford has a past medical history of Arthritis, Cataract, Depression, Diabetes mellitus without complication ([place]), Diabetic retinopathy ([place]), High cholesterol, and Hypertension.   She has a past surgical history that includes Eye surgery and Tubal ligation.   Her family history includes Cancer in her father; Heart disease in her brother; Hyperlipidemia in her brother and sister; Hypertension in her brother and sister.She reports that she has never smoked. She has quit using smokeless tobacco.  Her smokeless tobacco use included snuff. She reports that she does not drink alcohol and does not use drugs.  Outpatient Medications Prior to Visit  Medication Sig Dispense Refill   acetaminophen (TYLENOL) 500 MG tablet Take 2 tablets (1,000 mg total) every 8 (eight) hours as needed by mouth for moderate pain. 30 tablet 0   aspirin 81 MG tablet Take 81 mg by mouth daily.      carvedilol (COREG) 12.5 MG tablet Take 1 tablet (12.5 mg total) by mouth 2 (two) times daily with a meal. 180 tablet 1   Cholecalciferol (VITAMIN D PO) Take 5,000 Units by mouth daily. OTC, strength not known      conjugated estrogens (PREMARIN) vaginal cream Place pea-sized amount of cream to the vagina every night for 2 weeks and then 2 times a week  thereafter.     DUREZOL 0.05 % EMUL Inject 1 drop into the eye daily.     Elastic Bandages & Supports (BACK SUPPORT) MISC      estradiol (ESTRACE) 0.1 MG/GM vaginal cream Insert pea-sized amount per vagina every other night     Incontinence Supply Disposable (ASSURANCE UNDERWEAR WOMENS) MISC      lisinopril (ZESTRIL) 20 MG tablet Take 1 tablet (20 mg total) by mouth daily. 90 tablet 3   Multiple Vitamin (MULTIVITAMIN WITH MINERALS) TABS tablet Take 1 tablet by mouth daily.     vitamin C (ASCORBIC ACID) 500 MG tablet Take 500 mg by mouth daily.     No facility-administered medications prior to visit.    ROS Review of Systems  Constitutional:  Negative for appetite change, diaphoresis, fatigue and unexpected weight change.  HENT: Negative.    Eyes: Negative.   Respiratory:  Negative for cough, chest tightness, shortness of breath and wheezing.   Cardiovascular:  Negative for chest pain, palpitations and leg swelling.  Gastrointestinal:  Negative for abdominal pain, constipation, diarrhea, nausea and vomiting.  Endocrine: Negative.   Genitourinary: Negative.  Negative for difficulty urinating, frequency, hematuria and urgency.  Musculoskeletal: Negative.  Negative for arthralgias and myalgias.  Skin: Negative.  Negative for color change and pallor.  Allergic/Immunologic: Negative.   Neurological: Negative.  Negative for dizziness, weakness, light-headedness and headaches.  Hematological:  Negative for adenopathy. Does not bruise/bleed easily.  Psychiatric/Behavioral: Negative.     Objective:  BP (!) 166/68 (BP Location: Right Arm, Patient Position: Sitting, Cuff Size: Large)  Pulse 60    Temp 98.2 F (36.8 C) (Oral)    Resp 16    Ht 5\' 1"  (1.549 m)    Wt 151 lb (68.5 kg)    SpO2 97%    BMI 28.53 kg/m   Physical Exam Vitals reviewed.  Constitutional:      Appearance: Normal appearance.  HENT:     Nose: Nose normal.     Mouth/Throat:     Mouth: Mucous membranes are moist.   Eyes:     General: No scleral icterus.    Conjunctiva/sclera: Conjunctivae normal.  Cardiovascular:     Rate and Rhythm: Normal rate and regular rhythm.     Heart sounds: No murmur heard. Pulmonary:     Effort: Pulmonary effort is normal.     Breath sounds: No stridor. No wheezing, rhonchi or rales.  Abdominal:     General: Abdomen is flat.     Palpations: There is no mass.     Tenderness: There is no abdominal tenderness. There is no guarding.     Hernia: No hernia is present.  Musculoskeletal:        General: Normal range of motion.     Cervical back: Neck supple.     Right lower leg: No edema.     Left lower leg: No edema.  Lymphadenopathy:     Cervical: No cervical adenopathy.  Skin:    General: Skin is warm and dry.  Neurological:     General: No focal deficit present.     Mental Status: She is alert. Mental status is at baseline.    Lab Results  Component Value Date   WBC 4.5 11/21/2021   HGB 11.6 (L) 11/21/2021   HCT 34.7 (L) 11/21/2021   PLT 232.0 11/21/2021   GLUCOSE 99 11/21/2021   CHOL 213 (H) 01/13/2020   TRIG 87 01/13/2020   HDL 76 01/13/2020   LDLCALC 122 (H) 01/13/2020   ALT 13 11/21/2021   AST 23 11/21/2021   NA 138 11/21/2021   K 3.8 11/21/2021   CL 102 11/21/2021   CREATININE 1.27 (H) 11/21/2021   BUN 10 11/21/2021   CO2 29 11/21/2021   TSH 1.38 11/21/2021   HGBA1C 5.9 11/21/2021     Assessment & Plan:   Leah Stafford was seen today for annual exam, hypertension and anemia.  Diagnoses and all orders for this visit:  Primary hypertension- Her blood pressure is adequately well controlled. 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 -     Hemoglobin A1c; Future -     Hemoglobin A1c  Encounter for general adult medical examination with abnormal findings- Exam completed, labs reviewed, vaccines reviewed- she refused a flu vaccine but agreed to get a pneumonia vaccine,  no cancer screenings indicated, patient education was given.  Deficiency anemia- Vitamin levels are all normal.  This is likely the anemia of chronic kidney disease. -     Vitamin B12; Future -     IBC + Ferritin; Future -     CBC with Differential/Platelet; Future -     Reticulocytes; Future -     Folate; Future -     Vitamin B1; Future -     Vitamin B1 -     Folate -     Reticulocytes -     CBC with Differential/Platelet -     IBC + Ferritin -     Vitamin B12  Need for prophylactic vaccination with combined diphtheria-tetanus-pertussis (DTP) vaccine -     Tdap (BOOSTRIX) 5-2.5-18.5 LF-MCG/0.5 injection; Inject 0.5 mLs into the muscle once for 1 dose.  Chronic renal disease, stage 4, severely decreased glomerular filtration rate (GFR) between 15-29 mL/min/1.73 square meter (HCC)- Her blood pressure is well controlled.  She will avoid nephrotoxic agents.  Other orders -     Pneumococcal polysaccharide vaccine 23-valent greater than or equal to 2yo subcutaneous/IM   I am having Leah Stafford start on Boostrix. I am also having her maintain her aspirin, multivitamin with minerals, Cholecalciferol (VITAMIN D PO), Durezol, vitamin C, acetaminophen, Assurance Underwear Womens, Back Support, lisinopril, carvedilol, conjugated estrogens, and estradiol.  Meds ordered this encounter  Medications   Tdap (BOOSTRIX) 5-2.5-18.5 LF-MCG/0.5 injection    Sig: Inject 0.5 mLs into the  muscle once for 1 dose.    Dispense:  0.5 mL    Refill:  0     Follow-up: Return in about 3 months (around 02/19/2022).  [name], MD

## 2021-11-22 DIAGNOSIS — D631 Anemia in chronic kidney disease: Secondary | ICD-10-CM | POA: Insufficient documentation

## 2021-11-22 DIAGNOSIS — N184 Chronic kidney disease, stage 4 (severe): Secondary | ICD-10-CM | POA: Insufficient documentation

## 2021-11-22 DIAGNOSIS — Z23 Encounter for immunization: Secondary | ICD-10-CM | POA: Insufficient documentation

## 2021-11-23 ENCOUNTER — Ambulatory Visit (INDEPENDENT_AMBULATORY_CARE_PROVIDER_SITE_OTHER): Payer: Medicare HMO

## 2021-11-23 VITALS — Ht 61.0 in | Wt 151.0 lb

## 2021-11-23 DIAGNOSIS — Z0000 Encounter for general adult medical examination without abnormal findings: Secondary | ICD-10-CM

## 2021-11-23 NOTE — Patient Instructions (Signed)
Leah Stafford , Thank you for taking time to complete your Medicare Wellness Visit. I appreciate your ongoing commitment to your health goals. Please review the following plan we discussed and let me know if I can assist you in the future.   Screening recommendations/referrals: Colonoscopy: no longer required  Mammogram: due, declined today, discuss with PCP if you change your mind Bone Density: due, declined today, discuss with PCP if you change your mind Recommended yearly ophthalmology/optometry visit for glaucoma screening and checkup Recommended yearly dental visit for hygiene and checkup  Vaccinations: Influenza vaccine: due, declined today, discuss with PCP if you change your mind Pneumococcal vaccine: up to date Tdap vaccine: due, declined today, discuss with pharmacy if you change your mind Shingles vaccine: declined today, discuss with pharmacy if you change your mind   Covid-19:newest booster available at your local pharmacy  Advanced directives: Please bring a copy of Living Will and/or Healthcare Power of Attorney for your chart.   Conditions/risks identified: see problem list  Next appointment: Follow up in one year for your annual wellness visit    Preventive Care 65 Years and Older, Female Preventive care refers to lifestyle choices and visits with your health care provider that can promote health and wellness. What does preventive care include? A yearly physical exam. This is also called an annual well check. Dental exams once or twice a year. Routine eye exams. Ask your health care provider how often you should have your eyes checked. Personal lifestyle choices, including: Daily care of your teeth and gums. Regular physical activity. Eating a healthy diet. Avoiding tobacco and drug use. Limiting alcohol use. Practicing safe sex. Taking low-dose aspirin every day. Taking vitamin and mineral supplements as recommended by your health care provider. What happens  during an annual well check? The services and screenings done by your health care provider during your annual well check will depend on your age, overall health, lifestyle risk factors, and family history of disease. Counseling  Your health care provider may ask you questions about your: Alcohol use. Tobacco use. Drug use. Emotional well-being. Home and relationship well-being. Sexual activity. Eating habits. History of falls. Memory and ability to understand (cognition). Work and work Statistician. Reproductive health. Screening  You may have the following tests or measurements: Height, weight, and BMI. Blood pressure. Lipid and cholesterol levels. These may be checked every 5 years, or more frequently if you are over 20 years old. Skin check. Lung cancer screening. You may have this screening every year starting at age 30 if you have a 30-pack-year history of smoking and currently smoke or have quit within the past 15 years. Fecal occult blood test (FOBT) of the stool. You may have this test every year starting at age 60. Flexible sigmoidoscopy or colonoscopy. You may have a sigmoidoscopy every 5 years or a colonoscopy every 10 years starting at age 31. Hepatitis C blood test. Hepatitis B blood test. Sexually transmitted disease (STD) testing. Diabetes screening. This is done by checking your blood sugar (glucose) after you have not eaten for a while (fasting). You may have this done every 1-3 years. Bone density scan. This is done to screen for osteoporosis. You may have this done starting at age 54. Mammogram. This may be done every 1-2 years. Talk to your health care provider about how often you should have regular mammograms. Talk with your health care provider about your test results, treatment options, and if necessary, the need for more tests. Vaccines  Your health  care provider may recommend certain vaccines, such as: Influenza vaccine. This is recommended every  year. Tetanus, diphtheria, and acellular pertussis (Tdap, Td) vaccine. You may need a Td booster every 10 years. Zoster vaccine. You may need this after age 63. Pneumococcal 13-valent conjugate (PCV13) vaccine. One dose is recommended after age 59. Pneumococcal polysaccharide (PPSV23) vaccine. One dose is recommended after age 48. Talk to your health care provider about which screenings and vaccines you need and how often you need them. This information is not intended to replace advice given to you by your health care provider. Make sure you discuss any questions you have with your health care provider. Document Released: 12/09/2015 Document Revised: 08/01/2016 Document Reviewed: 09/13/2015 Elsevier Interactive Patient Education  2017 [place] Prevention in the Home Falls can cause injuries. They can happen to people of all ages. There are many things you can do to make your home safe and to help prevent falls. What can I do on the outside of my home? Regularly fix the edges of walkways and driveways and fix any cracks. Remove anything that might make you trip as you walk through a door, such as a raised step or threshold. Trim any bushes or trees on the path to your home. Use bright outdoor lighting. Clear any walking paths of anything that might make someone trip, such as rocks or tools. Regularly check to see if handrails are loose or broken. Make sure that both sides of any steps have handrails. Any raised decks and porches should have guardrails on the edges. Have any leaves, snow, or ice cleared regularly. Use sand or salt on walking paths during winter. Clean up any spills in your garage right away. This includes oil or grease spills. What can I do in the bathroom? Use night lights. Install grab bars by the toilet and in the tub and shower. Do not use towel bars as grab bars. Use non-skid mats or decals in the tub or shower. If you need to sit down in the shower, use a  plastic, non-slip stool. Keep the floor dry. Clean up any water that spills on the floor as soon as it happens. Remove soap buildup in the tub or shower regularly. Attach bath mats securely with double-sided non-slip rug tape. Do not have throw rugs and other things on the floor that can make you trip. What can I do in the bedroom? Use night lights. Make sure that you have a light by your bed that is easy to reach. Do not use any sheets or blankets that are too big for your bed. They should not hang down onto the floor. Have a firm chair that has side arms. You can use this for support while you get dressed. Do not have throw rugs and other things on the floor that can make you trip. What can I do in the kitchen? Clean up any spills right away. Avoid walking on wet floors. Keep items that you use a lot in easy-to-reach places. If you need to reach something above you, use a strong step stool that has a grab bar. Keep electrical cords out of the way. Do not use floor polish or wax that makes floors slippery. If you must use wax, use non-skid floor wax. Do not have throw rugs and other things on the floor that can make you trip. What can I do with my stairs? Do not leave any items on the stairs. Make sure that there are handrails on  both sides of the stairs and use them. Fix handrails that are broken or loose. Make sure that handrails are as long as the stairways. Check any carpeting to make sure that it is firmly attached to the stairs. Fix any carpet that is loose or worn. Avoid having throw rugs at the top or bottom of the stairs. If you do have throw rugs, attach them to the floor with carpet tape. Make sure that you have a light switch at the top of the stairs and the bottom of the stairs. If you do not have them, ask someone to add them for you. What else can I do to help prevent falls? Wear shoes that: Do not have high heels. Have rubber bottoms. Are comfortable and fit you  well. Are closed at the toe. Do not wear sandals. If you use a stepladder: Make sure that it is fully opened. Do not climb a closed stepladder. Make sure that both sides of the stepladder are locked into place. Ask someone to hold it for you, if possible. Clearly mark and make sure that you can see: Any grab bars or handrails. First and last steps. Where the edge of each step is. Use tools that help you move around (mobility aids) if they are needed. These include: Canes. Walkers. Scooters. Crutches. Turn on the lights when you go into a dark area. Replace any light bulbs as soon as they burn out. Set up your furniture so you have a clear path. Avoid moving your furniture around. If any of your floors are uneven, fix them. If there are any pets around you, be aware of where they are. Review your medicines with your doctor. Some medicines can make you feel dizzy. This can increase your chance of falling. Ask your doctor what other things that you can do to help prevent falls. This information is not intended to replace advice given to you by your health care provider. Make sure you discuss any questions you have with your health care provider. Document Released: 09/08/2009 Document Revised: 04/19/2016 Document Reviewed: 12/17/2014 Elsevier Interactive Patient Education  2017 [employer].

## 2021-11-25 LAB — RETICULOCYTES
ABS Retic: 47190 cells/uL (ref 20000–80000)
Retic Ct Pct: 1.3 %

## 2021-11-25 LAB — VITAMIN B1: Vitamin B1 (Thiamine): 51 nmol/L — ABNORMAL HIGH (ref 8–30)

## 2021-12-05 ENCOUNTER — Encounter: Payer: Self-pay | Admitting: Internal Medicine

## 2021-12-05 DIAGNOSIS — H35371 Puckering of macula, right eye: Secondary | ICD-10-CM | POA: Diagnosis not present

## 2021-12-05 DIAGNOSIS — H5213 Myopia, bilateral: Secondary | ICD-10-CM | POA: Diagnosis not present

## 2021-12-05 DIAGNOSIS — Z0100 Encounter for examination of eyes and vision without abnormal findings: Secondary | ICD-10-CM | POA: Diagnosis not present

## 2021-12-05 DIAGNOSIS — H524 Presbyopia: Secondary | ICD-10-CM | POA: Diagnosis not present

## 2021-12-05 DIAGNOSIS — E113553 Type 2 diabetes mellitus with stable proliferative diabetic retinopathy, bilateral: Secondary | ICD-10-CM | POA: Diagnosis not present

## 2021-12-05 DIAGNOSIS — R6889 Other general symptoms and signs: Secondary | ICD-10-CM | POA: Diagnosis not present

## 2021-12-05 DIAGNOSIS — Z961 Presence of intraocular lens: Secondary | ICD-10-CM | POA: Diagnosis not present

## 2021-12-05 LAB — HM DIABETES EYE EXAM

## 2021-12-06 ENCOUNTER — Ambulatory Visit (INDEPENDENT_AMBULATORY_CARE_PROVIDER_SITE_OTHER): Payer: Medicare HMO | Admitting: Podiatry

## 2021-12-06 ENCOUNTER — Encounter: Payer: Self-pay | Admitting: Podiatry

## 2021-12-06 ENCOUNTER — Other Ambulatory Visit: Payer: Self-pay

## 2021-12-06 DIAGNOSIS — N184 Chronic kidney disease, stage 4 (severe): Secondary | ICD-10-CM | POA: Diagnosis not present

## 2021-12-06 DIAGNOSIS — E119 Type 2 diabetes mellitus without complications: Secondary | ICD-10-CM

## 2021-12-06 DIAGNOSIS — M79674 Pain in right toe(s): Secondary | ICD-10-CM | POA: Diagnosis not present

## 2021-12-06 DIAGNOSIS — B351 Tinea unguium: Secondary | ICD-10-CM

## 2021-12-06 DIAGNOSIS — M79675 Pain in left toe(s): Secondary | ICD-10-CM | POA: Diagnosis not present

## 2021-12-06 DIAGNOSIS — N289 Disorder of kidney and ureter, unspecified: Secondary | ICD-10-CM

## 2021-12-06 DIAGNOSIS — R6889 Other general symptoms and signs: Secondary | ICD-10-CM | POA: Diagnosis not present

## 2021-12-06 NOTE — Progress Notes (Signed)
This patient returns to my office for at risk foot care.  This patient requires this care by a professional since this patient will be at risk due to having diabetes and kidney disease.   This patient is unable to cut nails herself since the patient cannot reach her nails.These nails are painful walking and wearing shoes.  This patient presents for at risk foot care today.  General Appearance  Alert, conversant and in no acute stress.  Vascular  Dorsalis pedis and posterior tibial  pulses are palpable  bilaterally.  Capillary return is within normal limits  bilaterally. Temperature is within normal limits  bilaterally.  Neurologic  Senn-Weinstein monofilament wire test within normal limits  bilaterally. Muscle power within normal limits bilaterally.  Nails Thick disfigured discolored nails with subungual debris  from hallux to fifth toes bilaterally. No evidence of bacterial infection or drainage bilaterally.  Orthopedic  No limitations of motion  feet .  No crepitus or effusions noted.  HAV  B/L.  HT 2,3  B/L.  Skin  normotropic skin with no porokeratosis noted bilaterally.  No signs of infections or ulcers noted.     Onychomycosis  Pain in right toes  Pain in left toes  Consent was obtained for treatment procedures.   Mechanical debridement of nails 1-5  bilaterally performed with a nail nipper.  Filed with dremel without incident.  Patient is wearing nail polish which is fading except for certain toenails.   Return office visit   10 weeks                   Told patient to return for periodic foot care and evaluation due to potential at risk complications.   Gardiner Barefoot DPM

## 2022-01-22 ENCOUNTER — Telehealth: Payer: Self-pay | Admitting: Internal Medicine

## 2022-01-22 ENCOUNTER — Other Ambulatory Visit: Payer: Self-pay | Admitting: Internal Medicine

## 2022-01-22 DIAGNOSIS — I10 Essential (primary) hypertension: Secondary | ICD-10-CM

## 2022-01-22 DIAGNOSIS — N289 Disorder of kidney and ureter, unspecified: Secondary | ICD-10-CM

## 2022-01-22 DIAGNOSIS — E1121 Type 2 diabetes mellitus with diabetic nephropathy: Secondary | ICD-10-CM

## 2022-01-22 MED ORDER — LISINOPRIL 20 MG PO TABS: 20.0000 mg | ORAL_TABLET | Freq: Every day | ORAL | 0 refills | Status: DC

## 2022-01-22 MED ORDER — CARVEDILOL 12.5 MG PO TABS: 12.5000 mg | ORAL_TABLET | Freq: Two times a day (BID) | ORAL | 0 refills | Status: DC

## 2022-01-22 NOTE — Telephone Encounter (Signed)
1.Medication Requested: lisinopril (ZESTRIL) 20 MG tablet  carvedilol (COREG) 12.5 MG tablet   2. Pharmacy (Name, [facility]): [place], [place]  3. On Med List: yes  4. Last Visit with PCP: 11-21-2021  5. Next visit date with PCP: 02-20-2022  Provider did not prescribe medications, pt requesting new rxs

## 2022-02-08 DIAGNOSIS — I160 Hypertensive urgency: Secondary | ICD-10-CM | POA: Diagnosis not present

## 2022-02-08 DIAGNOSIS — Z4689 Encounter for fitting and adjustment of other specified devices: Secondary | ICD-10-CM | POA: Diagnosis not present

## 2022-02-08 DIAGNOSIS — N813 Complete uterovaginal prolapse: Secondary | ICD-10-CM | POA: Diagnosis not present

## 2022-02-08 DIAGNOSIS — R6889 Other general symptoms and signs: Secondary | ICD-10-CM | POA: Diagnosis not present

## 2022-02-19 ENCOUNTER — Ambulatory Visit: Payer: Medicare HMO | Admitting: Internal Medicine

## 2022-02-19 ENCOUNTER — Encounter: Payer: Self-pay | Admitting: Podiatry

## 2022-02-19 ENCOUNTER — Other Ambulatory Visit: Payer: Self-pay

## 2022-02-19 ENCOUNTER — Ambulatory Visit (INDEPENDENT_AMBULATORY_CARE_PROVIDER_SITE_OTHER): Payer: Medicare HMO | Admitting: Podiatry

## 2022-02-19 DIAGNOSIS — B351 Tinea unguium: Secondary | ICD-10-CM

## 2022-02-19 DIAGNOSIS — N184 Chronic kidney disease, stage 4 (severe): Secondary | ICD-10-CM

## 2022-02-19 DIAGNOSIS — E119 Type 2 diabetes mellitus without complications: Secondary | ICD-10-CM

## 2022-02-19 DIAGNOSIS — N289 Disorder of kidney and ureter, unspecified: Secondary | ICD-10-CM

## 2022-02-19 DIAGNOSIS — R6889 Other general symptoms and signs: Secondary | ICD-10-CM | POA: Diagnosis not present

## 2022-02-19 DIAGNOSIS — M79675 Pain in left toe(s): Secondary | ICD-10-CM | POA: Diagnosis not present

## 2022-02-19 DIAGNOSIS — M79674 Pain in right toe(s): Secondary | ICD-10-CM | POA: Diagnosis not present

## 2022-02-19 NOTE — Progress Notes (Addendum)
This patient returns to my office for at risk foot care.  This patient requires this care by a professional since this patient will be at risk due to having diabetes and kidney disease.   This patient is unable to cut nails herself since the patient cannot reach her nails.These nails are painful walking and wearing shoes.  This patient presents for at risk foot care today. ? ?General Appearance  Alert, conversant and in no acute stress. ? ?Vascular  Dorsalis pedis and posterior tibial  pulses are weakly palpable  bilaterally.  Capillary return is within normal limits  bilaterally. Temperature is within normal limits  bilaterally. ? ?Neurologic  Senn-Weinstein monofilament wire test within normal limits  bilaterally. Muscle power within normal limits bilaterally. ? ?Nails Thick disfigured discolored nails with subungual debris  from hallux to fifth toes bilaterally. No evidence of bacterial infection or drainage bilaterally. ? ?Orthopedic  No limitations of motion  feet .  No crepitus or effusions noted.  HAV  B/L.  HT 2,3  B/L. ? ?Skin  normotropic skin with no porokeratosis noted bilaterally.  No signs of infections or ulcers noted.    ? ?Onychomycosis  Pain in right toes  Pain in left toes ? ?Consent was obtained for treatment procedures.   Mechanical debridement of nails 1-5  bilaterally performed with a nail nipper.  Filed with dremel without incident.  Patient kept requesting shorter nails but she kept moving as I shortened her nails.  I apologized if she was not happy. ? ? ?Return office visit   10 weeks                   Told patient to return for periodic foot care and evaluation due to potential at risk complications. ? ? ?Gardiner Barefoot DPM   ?

## 2022-02-20 ENCOUNTER — Encounter: Payer: Self-pay | Admitting: Internal Medicine

## 2022-02-20 ENCOUNTER — Ambulatory Visit (INDEPENDENT_AMBULATORY_CARE_PROVIDER_SITE_OTHER): Payer: Medicare HMO | Admitting: Internal Medicine

## 2022-02-20 VITALS — BP 160/78 | HR 45 | Temp 98.5°F | Ht 61.0 in | Wt 152.0 lb

## 2022-02-20 DIAGNOSIS — R6889 Other general symptoms and signs: Secondary | ICD-10-CM | POA: Diagnosis not present

## 2022-02-20 DIAGNOSIS — N184 Chronic kidney disease, stage 4 (severe): Secondary | ICD-10-CM

## 2022-02-20 DIAGNOSIS — D631 Anemia in chronic kidney disease: Secondary | ICD-10-CM | POA: Diagnosis not present

## 2022-02-20 DIAGNOSIS — I10 Essential (primary) hypertension: Secondary | ICD-10-CM

## 2022-02-20 NOTE — Progress Notes (Signed)
? ?Subjective:  ?Patient ID: Leah Stafford, female    DOB: [birth year]  Age: 88 y.o. MRN: [id number] ? ?CC: Hypertension ? ? ?HPI ?Leah Stafford presents for f/up - ? ?She tells me her blood pressure has been well controlled.  She is active and denies chest pain, shortness of breath, dizziness, lightheadedness, chest pain, or edema. ? ?Outpatient Medications Prior to Visit  ?Medication Sig Dispense Refill  ? acetaminophen (TYLENOL) 500 MG tablet Take 2 tablets (1,000 mg total) every 8 (eight) hours as needed by mouth for moderate pain. 30 tablet 0  ? carvedilol (COREG) 12.5 MG tablet Take 1 tablet (12.5 mg total) by mouth 2 (two) times daily with a meal. 180 tablet 0  ? Cholecalciferol (VITAMIN D PO) Take 5,000 Units by mouth daily. OTC, strength not known     ? conjugated estrogens (PREMARIN) vaginal cream Place pea-sized amount of cream to the vagina every night for 2 weeks and then 2 times a week thereafter.    ? DUREZOL 0.05 % EMUL Inject 1 drop into the eye daily.    ? Elastic Bandages & Supports (BACK SUPPORT) MISC     ? estradiol (ESTRACE) 0.1 MG/GM vaginal cream Insert pea-sized amount per vagina every other night    ? Incontinence Supply Disposable (ASSURANCE UNDERWEAR WOMENS) MISC     ? lisinopril (ZESTRIL) 20 MG tablet Take 1 tablet (20 mg total) by mouth daily. 90 tablet 0  ? Multiple Vitamin (MULTIVITAMIN WITH MINERALS) TABS tablet Take 1 tablet by mouth daily.    ? vitamin C (ASCORBIC ACID) 500 MG tablet Take 500 mg by mouth daily.    ? ?No facility-administered medications prior to visit.  ? ? ?ROS ?Review of Systems  ?Constitutional:  Negative for diaphoresis, fatigue and unexpected weight change.  ?HENT: Negative.    ?Eyes: Negative.   ?Respiratory:  Negative for cough, chest tightness, shortness of breath and wheezing.   ?Cardiovascular:  Negative for chest pain, palpitations and leg swelling.  ?Gastrointestinal:  Negative for abdominal pain, diarrhea and nausea.  ?Endocrine: Negative.    ?Genitourinary: Negative.  Negative for difficulty urinating and vaginal bleeding.  ?Musculoskeletal:  Positive for arthralgias. Negative for back pain, joint swelling and myalgias.  ?Skin: Negative.   ?Neurological:  Negative for dizziness, syncope, weakness, light-headedness and numbness.  ?Hematological:  Negative for adenopathy. Does not bruise/bleed easily.  ?Psychiatric/Behavioral: Negative.    ? ?Objective:  ?BP (!) 160/78 (BP Location: Right Arm, Patient Position: Sitting, Cuff Size: Large)   Pulse (!) 45   Temp 98.5 ?F (36.9 ?C) (Oral)   Ht 5\' 1"  (1.549 m)   Wt 152 lb (68.9 kg)   SpO2 99%   BMI 28.72 kg/m?  ? ?BP Readings from Last 3 Encounters:  ?02/20/22 (!) 160/78  ?11/21/21 (!) 166/68  ?04/13/20 (!) 144/70  ? ? ?Wt Readings from Last 3 Encounters:  ?02/20/22 152 lb (68.9 kg)  ?11/23/21 151 lb (68.5 kg)  ?11/21/21 151 lb (68.5 kg)  ? ? ?Physical Exam ?Vitals reviewed.  ?Constitutional:   ?   Appearance: She is not ill-appearing.  ?HENT:  ?   Nose: Nose normal.  ?   Mouth/Throat:  ?   Mouth: Mucous membranes are moist.  ?Eyes:  ?   General: No scleral icterus. ?   Conjunctiva/sclera: Conjunctivae normal.  ?Cardiovascular:  ?   Rate and Rhythm: Regular rhythm. Bradycardia present.  ?   Pulses: Normal pulses.  ?   Heart sounds: No murmur heard. ?  No  gallop.  ?Pulmonary:  ?   Effort: Pulmonary effort is normal.  ?   Breath sounds: No stridor. No wheezing, rhonchi or rales.  ?Abdominal:  ?   General: Abdomen is flat.  ?   Palpations: There is no mass.  ?   Tenderness: There is no abdominal tenderness. There is no guarding.  ?   Hernia: No hernia is present.  ?Musculoskeletal:     ?   General: Normal range of motion.  ?   Right lower leg: No edema.  ?   Left lower leg: No edema.  ?Skin: ?   General: Skin is warm and dry.  ?   Coloration: Skin is not pale.  ?Neurological:  ?   General: No focal deficit present.  ?   Mental Status: She is alert. Mental status is at baseline.  ?Psychiatric:     ?   Mood  and Affect: Mood normal.     ?   Behavior: Behavior normal.  ? ? ?Lab Results  ?Component Value Date  ? WBC 4.5 11/21/2021  ? HGB 11.6 (L) 11/21/2021  ? HCT 34.7 (L) 11/21/2021  ? PLT 232.0 11/21/2021  ? GLUCOSE 99 11/21/2021  ? CHOL 213 (H) 01/13/2020  ? TRIG 87 01/13/2020  ? HDL 76 01/13/2020  ? LDLCALC 122 (H) 01/13/2020  ? ALT 13 11/21/2021  ? AST 23 11/21/2021  ? NA 138 11/21/2021  ? K 3.8 11/21/2021  ? CL 102 11/21/2021  ? CREATININE 1.27 (H) 11/21/2021  ? BUN 10 11/21/2021  ? CO2 29 11/21/2021  ? TSH 1.38 11/21/2021  ? HGBA1C 5.9 11/21/2021  ? ? ?No results found. ? ?Assessment & Plan:  ? ?Leah Stafford was seen today for hypertension. ? ?Diagnoses and all orders for this visit: ? ?Chronic renal disease, stage 4, severely decreased glomerular filtration rate (GFR) between 15-29 mL/min/1.73 square meter ([place])- Her renal function has been stable. ? ?Primary hypertension- Her BP is well controlled. She has no sx's related to the low HR. ? ?Anemia due to stage 4 chronic kidney disease ([place])- Her H/H have been stable. ? ? ?I am having Leah Stafford maintain her multivitamin with minerals, Cholecalciferol (VITAMIN D PO), Durezol, vitamin C, acetaminophen, Assurance Underwear Womens, Back Support, conjugated estrogens, estradiol, carvedilol, and lisinopril. ? ?No orders of the defined types were placed in this encounter. ? ? ? ?Follow-up: Return in about 6 months (around 08/23/2022). ? ?Leah Calico, MD ?

## 2022-02-20 NOTE — Patient Instructions (Signed)

## 2022-04-09 ENCOUNTER — Other Ambulatory Visit: Payer: Self-pay | Admitting: Internal Medicine

## 2022-04-09 DIAGNOSIS — N289 Disorder of kidney and ureter, unspecified: Secondary | ICD-10-CM

## 2022-04-09 DIAGNOSIS — I10 Essential (primary) hypertension: Secondary | ICD-10-CM

## 2022-04-09 DIAGNOSIS — E1121 Type 2 diabetes mellitus with diabetic nephropathy: Secondary | ICD-10-CM

## 2022-04-17 DIAGNOSIS — R6889 Other general symptoms and signs: Secondary | ICD-10-CM | POA: Diagnosis not present

## 2022-05-07 ENCOUNTER — Ambulatory Visit: Payer: Medicare HMO | Admitting: Podiatry

## 2022-05-07 DIAGNOSIS — R6889 Other general symptoms and signs: Secondary | ICD-10-CM | POA: Diagnosis not present

## 2022-05-08 ENCOUNTER — Ambulatory Visit: Payer: Medicare HMO | Admitting: Podiatry

## 2022-05-23 ENCOUNTER — Encounter: Payer: Self-pay | Admitting: Podiatry

## 2022-05-23 ENCOUNTER — Ambulatory Visit (INDEPENDENT_AMBULATORY_CARE_PROVIDER_SITE_OTHER): Payer: Medicare HMO | Admitting: Podiatry

## 2022-05-23 DIAGNOSIS — M79674 Pain in right toe(s): Secondary | ICD-10-CM | POA: Diagnosis not present

## 2022-05-23 DIAGNOSIS — E119 Type 2 diabetes mellitus without complications: Secondary | ICD-10-CM | POA: Diagnosis not present

## 2022-05-23 DIAGNOSIS — M79675 Pain in left toe(s): Secondary | ICD-10-CM | POA: Diagnosis not present

## 2022-05-23 DIAGNOSIS — B351 Tinea unguium: Secondary | ICD-10-CM | POA: Diagnosis not present

## 2022-05-23 DIAGNOSIS — N289 Disorder of kidney and ureter, unspecified: Secondary | ICD-10-CM

## 2022-05-23 DIAGNOSIS — R6889 Other general symptoms and signs: Secondary | ICD-10-CM | POA: Diagnosis not present

## 2022-05-23 NOTE — Progress Notes (Signed)
This patient returns to my office for at risk foot care.  This patient requires this care by a professional since this patient will be at risk due to having diabetes and kidney disease.   This patient is unable to cut nails herself since the patient cannot reach her nails.These nails are painful walking and wearing shoes.  This patient presents for at risk foot care today.  General Appearance  Alert, conversant and in no acute stress.  Vascular  Dorsalis pedis and posterior tibial  pulses are weakly palpable  bilaterally.  Capillary return is within normal limits  bilaterally. Temperature is within normal limits  bilaterally.  Neurologic  Senn-Weinstein monofilament wire test within normal limits  bilaterally. Muscle power within normal limits bilaterally.  Nails Thick disfigured discolored nails with subungual debris  from hallux to fifth toes bilaterally. No evidence of bacterial infection or drainage bilaterally.  Orthopedic  No limitations of motion  feet .  No crepitus or effusions noted.  HAV  B/L.  HT 2,3  B/L.  Skin  normotropic skin with no porokeratosis noted bilaterally.  No signs of infections or ulcers noted.     Onychomycosis  Pain in right toes  Pain in left toes  Consent was obtained for treatment procedures.   Mechanical debridement of nails 1-5  bilaterally performed with a nail nipper.  Filed with dremel without incident.  Patient kept requesting shorter nails but she kept moving as I shortened her nails.  I apologized if she was not happy.   Return office visit   10 weeks                   Told patient to return for periodic foot care and evaluation due to potential at risk complications.   Gardiner Barefoot DPM

## 2022-05-24 DIAGNOSIS — R6889 Other general symptoms and signs: Secondary | ICD-10-CM | POA: Diagnosis not present

## 2022-05-24 DIAGNOSIS — Z4689 Encounter for fitting and adjustment of other specified devices: Secondary | ICD-10-CM | POA: Diagnosis not present

## 2022-08-01 ENCOUNTER — Ambulatory Visit: Payer: Medicare HMO | Admitting: Podiatry

## 2022-08-23 ENCOUNTER — Encounter: Payer: Self-pay | Admitting: Internal Medicine

## 2022-08-23 ENCOUNTER — Ambulatory Visit (INDEPENDENT_AMBULATORY_CARE_PROVIDER_SITE_OTHER): Payer: Medicare HMO | Admitting: Internal Medicine

## 2022-08-23 VITALS — BP 134/70 | HR 46 | Temp 98.3°F | Ht 61.0 in | Wt 139.0 lb

## 2022-08-23 DIAGNOSIS — R6889 Other general symptoms and signs: Secondary | ICD-10-CM | POA: Diagnosis not present

## 2022-08-23 DIAGNOSIS — N184 Chronic kidney disease, stage 4 (severe): Secondary | ICD-10-CM | POA: Diagnosis not present

## 2022-08-23 DIAGNOSIS — D631 Anemia in chronic kidney disease: Secondary | ICD-10-CM | POA: Diagnosis not present

## 2022-08-23 DIAGNOSIS — I10 Essential (primary) hypertension: Secondary | ICD-10-CM

## 2022-08-23 DIAGNOSIS — R001 Bradycardia, unspecified: Secondary | ICD-10-CM | POA: Diagnosis not present

## 2022-08-23 LAB — BASIC METABOLIC PANEL
BUN: 8 mg/dL (ref 6–23)
CO2: 29 mEq/L (ref 19–32)
Calcium: 9.8 mg/dL (ref 8.4–10.5)
Chloride: 103 mEq/L (ref 96–112)
Creatinine, Ser: 1.25 mg/dL — ABNORMAL HIGH (ref 0.40–1.20)
GFR: 39.04 mL/min — ABNORMAL LOW (ref >60.00)
Glucose, Bld: 104 mg/dL — ABNORMAL HIGH (ref 70–99)
Potassium: 3.7 mEq/L (ref 3.5–5.1)
Sodium: 139 mEq/L (ref 135–145)

## 2022-08-23 LAB — CBC WITH DIFFERENTIAL/PLATELET
Basophils Absolute: 0 10*3/uL (ref 0.0–0.1)
Basophils Relative: 0.6 % (ref 0.0–3.0)
Eosinophils Absolute: 0.3 10*3/uL (ref 0.0–0.7)
Eosinophils Relative: 5.6 % — ABNORMAL HIGH (ref 0.0–5.0)
HCT: 35.9 % — ABNORMAL LOW (ref 36.0–46.0)
Hemoglobin: 12 g/dL (ref 12.0–15.0)
Lymphocytes Relative: 46.9 % — ABNORMAL HIGH (ref 12.0–46.0)
Lymphs Abs: 2.6 10*3/uL (ref 0.7–4.0)
MCHC: 33.5 g/dL (ref 30.0–36.0)
MCV: 96 fl (ref 78.0–100.0)
Monocytes Absolute: 0.5 10*3/uL (ref 0.1–1.0)
Monocytes Relative: 9.8 % (ref 3.0–12.0)
Neutro Abs: 2.1 10*3/uL (ref 1.4–7.7)
Neutrophils Relative %: 37.1 % — ABNORMAL LOW (ref 43.0–77.0)
Platelets: 232 10*3/uL (ref 150.0–400.0)
RBC: 3.74 Mil/uL — ABNORMAL LOW (ref 3.87–5.11)
RDW: 14.6 % (ref 11.5–15.5)
WBC: 5.5 10*3/uL (ref 4.0–10.5)

## 2022-08-23 LAB — TSH: TSH: 2.29 u[IU]/mL (ref 0.35–5.50)

## 2022-08-23 NOTE — Progress Notes (Signed)
Subjective:  Patient ID: Leah Stafford, female    DOB: 01-31-1934  Age: 88 y.o. MRN: [id number]  CC: Anemia   HPI Leah Stafford presents for f/up -  She is not very active but denies palpitations, dizziness, lightheadedness, syncope, near syncope, or edema.  Outpatient Medications Prior to Visit  Medication Sig Dispense Refill   acetaminophen (TYLENOL) 500 MG tablet Take 2 tablets (1,000 mg total) every 8 (eight) hours as needed by mouth for moderate pain. 30 tablet 0   carvedilol (COREG) 12.5 MG tablet TAKE 1 TABLET TWICE DAILY WITH MEALS 180 tablet 0   Cholecalciferol (VITAMIN D PO) Take 5,000 Units by mouth daily. OTC, strength not known      conjugated estrogens (PREMARIN) vaginal cream Place pea-sized amount of cream to the vagina every night for 2 weeks and then 2 times a week thereafter.     DUREZOL 0.05 % EMUL Inject 1 drop into the eye daily.     Elastic Bandages & Supports (BACK SUPPORT) MISC      estradiol (ESTRACE) 0.1 MG/GM vaginal cream Insert pea-sized amount per vagina every other night     Incontinence Supply Disposable (ASSURANCE UNDERWEAR WOMENS) MISC      lisinopril (ZESTRIL) 20 MG tablet TAKE 1 TABLET (20 MG TOTAL) BY MOUTH DAILY. 90 tablet 0   Multiple Vitamin (MULTIVITAMIN WITH MINERALS) TABS tablet Take 1 tablet by mouth daily.     vitamin C (ASCORBIC ACID) 500 MG tablet Take 500 mg by mouth daily.     No facility-administered medications prior to visit.    ROS Review of Systems  Constitutional:  Negative for chills, diaphoresis, fatigue and fever.  HENT: Negative.    Eyes: Negative.   Respiratory:  Negative for cough, chest tightness, shortness of breath and wheezing.   Cardiovascular:  Negative for chest pain, palpitations and leg swelling.  Gastrointestinal:  Negative for abdominal pain, constipation, diarrhea, nausea and vomiting.  Endocrine: Negative.   Genitourinary: Negative.  Negative for difficulty urinating.  Musculoskeletal: Negative.    Skin: Negative.   Neurological: Negative.  Negative for dizziness, syncope, weakness and light-headedness.  Hematological:  Negative for adenopathy. Does not bruise/bleed easily.    Objective:  BP 134/70 (BP Location: Left Arm, Patient Position: Sitting, Cuff Size: Large)   Pulse (!) 46   Temp 98.3 F (36.8 C) (Oral)   Ht 5\' 1"  (1.549 m)   Wt 139 lb (63 kg)   SpO2 96%   BMI 26.26 kg/m   BP Readings from Last 3 Encounters:  08/23/22 134/70  02/20/22 (!) 160/78  11/21/21 (!) 166/68    Wt Readings from Last 3 Encounters:  08/23/22 139 lb (63 kg)  02/20/22 152 lb (68.9 kg)  11/23/21 151 lb (68.5 kg)    Physical Exam Vitals reviewed.  HENT:     Nose: Nose normal.     Mouth/Throat:     Mouth: Mucous membranes are moist.  Eyes:     General: No scleral icterus.    Conjunctiva/sclera: Conjunctivae normal.  Cardiovascular:     Rate and Rhythm: Regular rhythm. Bradycardia present.     Heart sounds: Normal heart sounds, S1 normal and S2 normal. No murmur heard.    Comments: EKG- SB, 42 bpm Diffuse T wave changes No LVH or Q waves Pulmonary:     Effort: Pulmonary effort is normal.     Breath sounds: No stridor. No wheezing, rhonchi or rales.  Abdominal:     General: Abdomen is flat.  Palpations: There is no mass.     Tenderness: There is no abdominal tenderness. There is no guarding.     Hernia: No hernia is present.  Musculoskeletal:     Cervical back: Neck supple.     Right lower leg: No edema.     Left lower leg: No edema.  Skin:    General: Skin is warm and dry.  Neurological:     General: No focal deficit present.     Mental Status: She is alert.  Psychiatric:        Mood and Affect: Mood normal.     Lab Results  Component Value Date   WBC 5.5 08/23/2022   HGB 12.0 08/23/2022   HCT 35.9 (L) 08/23/2022   PLT 232.0 08/23/2022   GLUCOSE 104 (H) 08/23/2022   CHOL 213 (H) 01/13/2020   TRIG 87 01/13/2020   HDL 76 01/13/2020   LDLCALC 122 (H)  01/13/2020   ALT 13 11/21/2021   AST 23 11/21/2021   NA 139 08/23/2022   K 3.7 08/23/2022   CL 103 08/23/2022   CREATININE 1.25 (H) 08/23/2022   BUN 8 08/23/2022   CO2 29 08/23/2022   TSH 2.29 08/23/2022   HGBA1C 5.9 11/21/2021    No results found.  Assessment & Plan:   Leah Stafford was seen today for anemia.  Diagnoses and all orders for this visit:  Primary hypertension- Her blood pressure is adequately well controlled. -     Basic metabolic panel; Future -     CBC with Differential/Platelet; Future -     TSH; Future -     EKG 12-Lead -     TSH -     CBC with Differential/Platelet -     Basic metabolic panel  Anemia due to stage 4 chronic kidney disease ([place])- Her H&H have improved. -     Basic metabolic panel; Future -     CBC with Differential/Platelet; Future -     CBC with Differential/Platelet -     Basic metabolic panel  Bradycardia- Labs are negative for secondary causes.  Will refer to cardiology to see if she needs to have a pacemaker. -     TSH; Future -     EKG 12-Lead -     TSH -     Ambulatory referral to Cardiology   I am having Leah Stafford maintain her multivitamin with minerals, Cholecalciferol (VITAMIN D PO), Durezol, ascorbic acid, acetaminophen, Assurance Underwear Womens, Back Support, conjugated estrogens, estradiol, carvedilol, and lisinopril.  No orders of the defined types were placed in this encounter.    Follow-up: No follow-ups on file.  Leah Calico, MD

## 2022-08-29 ENCOUNTER — Ambulatory Visit: Payer: Medicare HMO | Admitting: Podiatry

## 2022-09-10 ENCOUNTER — Other Ambulatory Visit: Payer: Self-pay | Admitting: Internal Medicine

## 2022-09-10 DIAGNOSIS — I10 Essential (primary) hypertension: Secondary | ICD-10-CM

## 2022-09-10 DIAGNOSIS — E1121 Type 2 diabetes mellitus with diabetic nephropathy: Secondary | ICD-10-CM

## 2022-09-10 DIAGNOSIS — N289 Disorder of kidney and ureter, unspecified: Secondary | ICD-10-CM

## 2022-09-17 ENCOUNTER — Encounter: Payer: Self-pay | Admitting: Podiatry

## 2022-09-17 ENCOUNTER — Ambulatory Visit (INDEPENDENT_AMBULATORY_CARE_PROVIDER_SITE_OTHER): Payer: Medicare HMO | Admitting: Podiatry

## 2022-09-17 DIAGNOSIS — E119 Type 2 diabetes mellitus without complications: Secondary | ICD-10-CM

## 2022-09-17 DIAGNOSIS — B351 Tinea unguium: Secondary | ICD-10-CM

## 2022-09-17 DIAGNOSIS — M79675 Pain in left toe(s): Secondary | ICD-10-CM

## 2022-09-17 DIAGNOSIS — N184 Chronic kidney disease, stage 4 (severe): Secondary | ICD-10-CM

## 2022-09-17 DIAGNOSIS — R6889 Other general symptoms and signs: Secondary | ICD-10-CM | POA: Diagnosis not present

## 2022-09-17 DIAGNOSIS — M79674 Pain in right toe(s): Secondary | ICD-10-CM | POA: Diagnosis not present

## 2022-09-17 NOTE — Progress Notes (Signed)
This patient returns to my office for at risk foot care.  This patient requires this care by a professional since this patient will be at risk due to having diabetes and kidney disease.   This patient is unable to cut nails herself since the patient cannot reach her nails.These nails are painful walking and wearing shoes.  This patient presents for at risk foot care today.  General Appearance  Alert, conversant and in no acute stress.  Vascular  Dorsalis pedis and posterior tibial  pulses are weakly palpable  bilaterally.  Capillary return is within normal limits  bilaterally. Temperature is within normal limits  bilaterally.  Neurologic  Senn-Weinstein monofilament wire test within normal limits  bilaterally. Muscle power within normal limits bilaterally.  Nails Thick disfigured discolored nails with subungual debris  from hallux to fifth toes bilaterally. No evidence of bacterial infection or drainage bilaterally.  Orthopedic  No limitations of motion  feet .  No crepitus or effusions noted.  HAV  B/L.  HT 2,3  B/L.  Skin  normotropic skin with no porokeratosis noted bilaterally.  No signs of infections or ulcers noted.     Onychomycosis  Pain in right toes  Pain in left toes  Consent was obtained for treatment procedures.   Mechanical debridement of nails 1-5  bilaterally performed with a nail nipper.  Filed with dremel without incident.     Return office visit   10 weeks                   Told patient to return for periodic foot care and evaluation due to potential at risk complications.   Gardiner Barefoot DPM

## 2022-09-26 ENCOUNTER — Encounter: Payer: Self-pay | Admitting: Internal Medicine

## 2022-09-26 ENCOUNTER — Ambulatory Visit: Payer: Medicare HMO | Attending: Internal Medicine | Admitting: Internal Medicine

## 2022-09-26 VITALS — BP 120/62 | HR 46 | Ht 62.0 in | Wt 139.0 lb

## 2022-09-26 DIAGNOSIS — R6889 Other general symptoms and signs: Secondary | ICD-10-CM | POA: Diagnosis not present

## 2022-09-26 DIAGNOSIS — E7800 Pure hypercholesterolemia, unspecified: Secondary | ICD-10-CM | POA: Diagnosis not present

## 2022-09-26 DIAGNOSIS — R001 Bradycardia, unspecified: Secondary | ICD-10-CM

## 2022-09-26 DIAGNOSIS — I10 Essential (primary) hypertension: Secondary | ICD-10-CM | POA: Diagnosis not present

## 2022-09-26 MED ORDER — CARVEDILOL 6.25 MG PO TABS: 6.2500 mg | ORAL_TABLET | Freq: Two times a day (BID) | ORAL | 3 refills | Status: DC

## 2022-09-26 NOTE — Progress Notes (Signed)
Cardiology Office Note:    Date:  09/26/2022   ID:  Leah Stafford, DOB 1934-08-18, MRN [id number]  PCP:  Janith Lima, MD   [facility] Cardiologist:  Werner Lean, MD     Referring MD: Janith Lima, MD   CC: asymptomatic bradycardia  on BB Consulted for the evaluation of bradycardia at the behest of Dr. Ronnald Ramp  History of Present Illness:    Leah Stafford is a 88 y.o. female with a hx of bradycardia NOS, HTN, HLD, CKD stage IV.  Patient notes that she is feeling good.   She takes walks and does stuff around the house. Is busy at [employer]. She can clean and sweep with no issues.  Has had no chest pain, chest pressure, chest tightness, chest stinging .  No shortness of breath, DOE .  No PND or orthopnea.  No weight gain, leg swelling , or abdominal swelling.  No syncope or near syncope . Notes  no palpitations or funny heart beats.     Ambulatory BP 124-130  SBP.   Past Medical History:  Diagnosis Date   Arthritis    Cataract    Depression    Diabetes mellitus without complication (HCC)    Diet and excercise   Diabetic retinopathy ([place])    High cholesterol    Hypertension     Past Surgical History:  Procedure Laterality Date   EYE SURGERY     TUBAL LIGATION      Current Medications: Current Meds  Medication Sig   acetaminophen (TYLENOL) 500 MG tablet Take 2 tablets (1,000 mg total) every 8 (eight) hours as needed by mouth for moderate pain.   carvedilol (COREG) 6.25 MG tablet Take 1 tablet (6.25 mg total) by mouth 2 (two) times daily.   Cholecalciferol (VITAMIN D PO) Take 5,000 Units by mouth daily. OTC, strength not known    conjugated estrogens (PREMARIN) vaginal cream Place pea-sized amount of cream to the vagina every night for 2 weeks and then 2 times a week thereafter.   DUREZOL 0.05 % EMUL Inject 1 drop into the eye daily.   Elastic Bandages & Supports (BACK SUPPORT) MISC as needed (pain).   estradiol  (ESTRACE) 0.1 MG/GM vaginal cream Insert pea-sized amount per vagina every other night   Incontinence Supply Disposable (ASSURANCE UNDERWEAR WOMENS) MISC daily.   lisinopril (ZESTRIL) 20 MG tablet TAKE 1 TABLET EVERY DAY   Multiple Vitamin (MULTIVITAMIN WITH MINERALS) TABS tablet Take 1 tablet by mouth daily.   vitamin C (ASCORBIC ACID) 500 MG tablet Take 500 mg by mouth daily.   [DISCONTINUED] carvedilol (COREG) 12.5 MG tablet TAKE 1 TABLET TWICE DAILY WITH MEALS     Allergies:   Codeine   Social History   Socioeconomic History   Marital status: Widowed    Spouse name: Not on file   Number of children: 3   Years of education: Not on file   Highest education level: Not on file  Occupational History   Occupation: retired  Tobacco Use   Smoking status: Never   Smokeless tobacco: Former    Types: Snuff  Vaping Use   Vaping Use: Never used  Substance and Sexual Activity   Alcohol use: No   Drug use: No   Sexual activity: Not Currently  Other Topics Concern   Not on file  Social History Narrative   Marital status: widowed; not dating in 2018      Children: 3 children; 75  grandchildren; 8 gg.      Lives: alone; daughter five minutes per day.      Employment: retired.       Tobacco: none      Alcohol: none      Exercise: previous walking; no walking during winter months.      ADLs: no driving.  Cleans, washes clothes; cleans house; pays bills.  Grocery shopping with daughter.  No assistant devices in 2018.       Advanced Directives:  None;  FULL CODE; daughter HCPOA.         Social Determinants of Health   Financial Resource Strain: Low Risk  (11/23/2021)   Overall Financial Resource Strain (CARDIA)    Difficulty of Paying Living Expenses: Not hard at all  Food Insecurity: No Food Insecurity (11/23/2021)   Hunger Vital Sign    Worried About Running Out of Food in the Last Year: Never true    Ran Out of Food in the Last Year: Never true  Transportation Needs: No  Transportation Needs (11/23/2021)   PRAPARE - Hydrologist (Medical): No    Lack of Transportation (Non-Medical): No  Physical Activity: Sufficiently Active (11/23/2021)   Exercise Vital Sign    Days of Exercise per Week: 7 days    Minutes of Exercise per Session: 40 min  Stress: No Stress Concern Present (11/23/2021)   [place]    Feeling of Stress : Not at all  Social Connections: Moderately Integrated (11/23/2021)   Social Connection and Isolation Panel [NHANES]    Frequency of Communication with Friends and Family: More than three times a week    Frequency of Social Gatherings with Friends and Family: More than three times a week    Attends Religious Services: More than 4 times per year    Active Member of [employer] or Organizations: Yes    Attends Archivist Meetings: More than 4 times per year    Marital Status: Widowed     Family History: The patient's family history includes Cancer in her father; Heart disease in her brother; Hyperlipidemia in her brother and sister; Hypertension in her brother and sister.  ROS:   Please see the history of present illness.     All other systems reviewed and are negative.  EKGs/Labs/Other Studies Reviewed:    The following studies were reviewed today:   EKG:   08/23/22: marked sinus bradycardia heart rate 42  Recent Labs: 11/21/2021: ALT 13 08/23/2022: BUN 8; Creatinine, Ser 1.25; Hemoglobin 12.0; Platelets 232.0; Potassium 3.7; Sodium 139; TSH 2.29  Recent Lipid Panel    Component Value Date/Time   CHOL 213 (H) 01/13/2020 1413   TRIG 87 01/13/2020 1413   HDL 76 01/13/2020 1413   CHOLHDL 2.8 01/13/2020 1413   LDLCALC 122 (H) 01/13/2020 1413    Physical Exam:    VS:  BP 120/62   Pulse (!) 46   Ht 5\' 2"  (1.575 m)   Wt 139 lb (63 kg)   SpO2 99%   BMI 25.42 kg/m     Wt Readings from Last 3 Encounters:  09/26/22 139 lb (63  kg)  08/23/22 139 lb (63 kg)  02/20/22 152 lb (68.9 kg)    GEN:  Well nourished, well developed in no acute distress HEENT: Normal NECK: No JVD; No carotid bruits LYMPHATICS: No lymphadenopathy CARDIAC: Regular bradycardia, no murmurs, rubs, gallops RESPIRATORY:  Clear to auscultation without rales, wheezing  or rhonchi  ABDOMEN: Soft, non-tender, non-distended MUSCULOSKELETAL:  No edema; No deformity  SKIN: Warm and dry NEUROLOGIC:  Alert and oriented x 3 PSYCHIATRIC:  Normal affect   ASSESSMENT:    1. Primary hypertension   2. Bradycardia   3. Pure hypercholesterolemia    PLAN:    Asymptomatic bradycardia HTN CKD Stage IV - will  coreg to 6.25 mg BID - I have asked her to check her BP and HR with her home cuff; if BP elevates add Norvasc 2.5 mg PO BID  HLD - LDL Direct  - we have discussed diet; she is now amenable to consideration of mediation therapy  One year me or APP unless difficulties with symptomatic bradycardia or BP control        Medication Adjustments/Labs and Tests Ordered: Current medicines are reviewed at length with the patient today.  Concerns regarding medicines are outlined above.  Orders Placed This Encounter  Procedures   LDL cholesterol, direct   EKG 12-Lead   Meds ordered this encounter  Medications   carvedilol (COREG) 6.25 MG tablet    Sig: Take 1 tablet (6.25 mg total) by mouth 2 (two) times daily.    Dispense:  180 tablet    Refill:  3    Dose change (decreased).    Patient Instructions  Medication Instructions:  Your physician has recommended you make the following change in your medication:   1) DECREASE carvedilol (Coreg) to 6.25mg  twice daily  *If you need a refill on your cardiac medications before your next appointment, please call your pharmacy*  Lab Work: TODAY: LDL direct If you have labs (blood work) drawn today and your tests are completely normal, you will receive your results only by: [place] (if you  have MyChart) OR A paper copy in the mail If you have any lab test that is abnormal or we need to change your treatment, we will call you to review the results.  Testing/Procedures: NONE  Follow-Up: At [facility], you and your health needs are our priority.  As part of our continuing mission to provide you with exceptional heart care, we have created designated Provider Care Teams.  These Care Teams include your primary Cardiologist (physician) and Advanced Practice Providers (APPs -  Physician Assistants and Nurse Practitioners) who all work together to provide you with the care you need, when you need it.  Your next appointment:   1 year(s)  The format for your next appointment:   In Person  Provider:   Werner Lean, MD  Other Instructions Please monitor your blood pressure and heart rate at home and call our office at [phone] in 2 weeks to report your readings.  HOW TO TAKE YOUR BLOOD PRESSURE: Rest 5 minutes before taking your blood pressure.  Don't smoke or drink caffeinated beverages for at least 30 minutes before. Take your blood pressure before (not after) you eat. Sit comfortably with your back supported and both feet on the floor (don't cross your legs). Elevate your arm to heart level on a table or a desk. Use the proper sized cuff. It should fit smoothly and snugly around your bare upper arm. There should be enough room to slip a fingertip under the cuff. The bottom edge of the cuff should be 1 inch above the crease of the elbow. Please monitor your blood pressure and if your blood pressure consistently remains above 130 on the top or 80 on the bottom x3 please call our office at  [phone] or send a MyChart message   Important Information About Sugar         Signed, Werner Lean, MD  09/26/2022 3:32 PM    [place]

## 2022-09-26 NOTE — Patient Instructions (Signed)
Medication Instructions:  Your physician has recommended you make the following change in your medication:   1) DECREASE carvedilol (Coreg) to 6.25mg  twice daily  *If you need a refill on your cardiac medications before your next appointment, please call your pharmacy*  Lab Work: TODAY: LDL direct If you have labs (blood work) drawn today and your tests are completely normal, you will receive your results only by: [place] (if you have MyChart) OR A paper copy in the mail If you have any lab test that is abnormal or we need to change your treatment, we will call you to review the results.  Testing/Procedures: NONE  Follow-Up: At [facility], you and your health needs are our priority.  As part of our continuing mission to provide you with exceptional heart care, we have created designated Provider Care Teams.  These Care Teams include your primary Cardiologist (physician) and Advanced Practice Providers (APPs -  Physician Assistants and Nurse Practitioners) who all work together to provide you with the care you need, when you need it.  Your next appointment:   1 year(s)  The format for your next appointment:   In Person  Provider:   Werner Lean, MD  Other Instructions Please monitor your blood pressure and heart rate at home and call our office at [phone] in 2 weeks to report your readings.  HOW TO TAKE YOUR BLOOD PRESSURE: Rest 5 minutes before taking your blood pressure.  Don't smoke or drink caffeinated beverages for at least 30 minutes before. Take your blood pressure before (not after) you eat. Sit comfortably with your back supported and both feet on the floor (don't cross your legs). Elevate your arm to heart level on a table or a desk. Use the proper sized cuff. It should fit smoothly and snugly around your bare upper arm. There should be enough room to slip a fingertip under the cuff. The bottom edge of the cuff should be 1 inch above  the crease of the elbow. Please monitor your blood pressure and if your blood pressure consistently remains above 130 on the top or 80 on the bottom x3 please call our office at [phone] or send a MyChart message   Important Information About Sugar

## 2022-09-27 ENCOUNTER — Telehealth: Payer: Self-pay | Admitting: Internal Medicine

## 2022-09-27 DIAGNOSIS — Z79899 Other long term (current) drug therapy: Secondary | ICD-10-CM

## 2022-09-27 LAB — LDL CHOLESTEROL, DIRECT: LDL Direct: 100 mg/dL — ABNORMAL HIGH (ref 0–99)

## 2022-09-27 MED ORDER — ROSUVASTATIN CALCIUM 5 MG PO TABS: 5.0000 mg | ORAL_TABLET | Freq: Every day | ORAL | 3 refills | Status: DC

## 2022-09-27 NOTE — Telephone Encounter (Signed)
-----   Message from Werner Lean, MD sent at 09/27/2022  9:45 AM EDT ----- Results: LDL above goal Plan: Offer rosuvastatin 5 and labs in three months  Werner Lean, MD

## 2022-09-27 NOTE — Telephone Encounter (Signed)
Called and spoke directly with patient who understands results and agrees to plan. Medication sent to mail order per request. Labs entered and scheduled for 01/02/23.

## 2022-11-05 ENCOUNTER — Telehealth: Payer: Self-pay | Admitting: Internal Medicine

## 2022-11-05 NOTE — Telephone Encounter (Signed)
Left message for patient to call back to schedule Medicare Annual Wellness Visit   Last AWV  11/23/21  Please schedule at anytime with [facility] if patient calls the office back.     Any questions, please call me at [phone]

## 2022-11-29 DIAGNOSIS — R6889 Other general symptoms and signs: Secondary | ICD-10-CM | POA: Diagnosis not present

## 2022-12-04 ENCOUNTER — Ambulatory Visit: Payer: Medicare HMO | Admitting: Podiatry

## 2022-12-12 ENCOUNTER — Encounter: Payer: Self-pay | Admitting: Podiatry

## 2022-12-12 ENCOUNTER — Ambulatory Visit (INDEPENDENT_AMBULATORY_CARE_PROVIDER_SITE_OTHER): Payer: Medicare HMO | Admitting: Podiatry

## 2022-12-12 DIAGNOSIS — R6889 Other general symptoms and signs: Secondary | ICD-10-CM | POA: Diagnosis not present

## 2022-12-12 DIAGNOSIS — E119 Type 2 diabetes mellitus without complications: Secondary | ICD-10-CM | POA: Diagnosis not present

## 2022-12-12 DIAGNOSIS — M79675 Pain in left toe(s): Secondary | ICD-10-CM

## 2022-12-12 DIAGNOSIS — M79674 Pain in right toe(s): Secondary | ICD-10-CM

## 2022-12-12 DIAGNOSIS — B351 Tinea unguium: Secondary | ICD-10-CM | POA: Diagnosis not present

## 2022-12-12 DIAGNOSIS — N184 Chronic kidney disease, stage 4 (severe): Secondary | ICD-10-CM

## 2022-12-12 NOTE — Progress Notes (Signed)
This patient returns to my office for at risk foot care.  This patient requires this care by a professional since this patient will be at risk due to having diabetes and kidney disease.   This patient is unable to cut nails herself since the patient cannot reach her nails.These nails are painful walking and wearing shoes.  This patient presents for at risk foot care today.  General Appearance  Alert, conversant and in no acute stress.  Vascular  Dorsalis pedis and posterior tibial  pulses are weakly palpable  bilaterally.  Capillary return is within normal limits  bilaterally. Temperature is within normal limits  bilaterally.  Neurologic  Senn-Weinstein monofilament wire test within normal limits  bilaterally. Muscle power within normal limits bilaterally.  Nails Thick disfigured discolored nails with subungual debris  from hallux to fifth toes bilaterally. No evidence of bacterial infection or drainage bilaterally.  Orthopedic  No limitations of motion  feet .  No crepitus or effusions noted.  HAV  B/L.  HT 2,3  B/L.  Skin  normotropic skin with no porokeratosis noted bilaterally.  No signs of infections or ulcers noted.     Onychomycosis  Pain in right toes  Pain in left toes  Consent was obtained for treatment procedures.   Mechanical debridement of nails 1-5  bilaterally performed with a nail nipper.  Filed with dremel without incident.     Return office visit   10 weeks                   Told patient to return for periodic foot care and evaluation due to potential at risk complications.   Gardiner Barefoot DPM

## 2023-01-02 ENCOUNTER — Other Ambulatory Visit: Payer: Medicare HMO

## 2023-01-03 ENCOUNTER — Ambulatory Visit: Payer: Medicare HMO | Attending: Internal Medicine

## 2023-01-03 DIAGNOSIS — R6889 Other general symptoms and signs: Secondary | ICD-10-CM | POA: Diagnosis not present

## 2023-01-03 DIAGNOSIS — Z79899 Other long term (current) drug therapy: Secondary | ICD-10-CM | POA: Diagnosis not present

## 2023-01-04 LAB — LIPID PANEL
Chol/HDL Ratio: 1.8 ratio (ref 0.0–4.4)
Cholesterol, Total: 147 mg/dL (ref 100–199)
HDL: 82 mg/dL (ref >39)
LDL Chol Calc (NIH): 54 mg/dL (ref 0–99)
Triglycerides: 49 mg/dL (ref 0–149)
VLDL Cholesterol Cal: 11 mg/dL (ref 5–40)

## 2023-01-04 LAB — ALT: ALT: 23 IU/L (ref 0–32)

## 2023-01-29 ENCOUNTER — Ambulatory Visit (INDEPENDENT_AMBULATORY_CARE_PROVIDER_SITE_OTHER): Payer: Medicare HMO | Admitting: Internal Medicine

## 2023-01-29 ENCOUNTER — Encounter: Payer: Self-pay | Admitting: Internal Medicine

## 2023-01-29 VITALS — BP 164/78 | HR 50 | Temp 98.3°F | Ht 62.0 in | Wt 135.0 lb

## 2023-01-29 DIAGNOSIS — D631 Anemia in chronic kidney disease: Secondary | ICD-10-CM

## 2023-01-29 DIAGNOSIS — I10 Essential (primary) hypertension: Secondary | ICD-10-CM

## 2023-01-29 DIAGNOSIS — E1121 Type 2 diabetes mellitus with diabetic nephropathy: Secondary | ICD-10-CM | POA: Diagnosis not present

## 2023-01-29 DIAGNOSIS — E113553 Type 2 diabetes mellitus with stable proliferative diabetic retinopathy, bilateral: Secondary | ICD-10-CM | POA: Diagnosis not present

## 2023-01-29 DIAGNOSIS — N184 Chronic kidney disease, stage 4 (severe): Secondary | ICD-10-CM

## 2023-01-29 DIAGNOSIS — R6889 Other general symptoms and signs: Secondary | ICD-10-CM | POA: Diagnosis not present

## 2023-01-29 DIAGNOSIS — Z0001 Encounter for general adult medical examination with abnormal findings: Secondary | ICD-10-CM

## 2023-01-29 DIAGNOSIS — Z0000 Encounter for general adult medical examination without abnormal findings: Secondary | ICD-10-CM

## 2023-01-29 LAB — BASIC METABOLIC PANEL WITH GFR
BUN: 11 mg/dL (ref 6–23)
CO2: 29 meq/L (ref 19–32)
Calcium: 9.6 mg/dL (ref 8.4–10.5)
Chloride: 103 meq/L (ref 96–112)
Creatinine, Ser: 1.14 mg/dL (ref 0.40–1.20)
GFR: 43.47 mL/min — ABNORMAL LOW
Glucose, Bld: 105 mg/dL — ABNORMAL HIGH (ref 70–99)
Potassium: 3.8 meq/L (ref 3.5–5.1)
Sodium: 139 meq/L (ref 135–145)

## 2023-01-29 LAB — CBC WITH DIFFERENTIAL/PLATELET
Basophils Absolute: 0 10*3/uL (ref 0.0–0.1)
Basophils Relative: 0.7 % (ref 0.0–3.0)
Eosinophils Absolute: 0.2 10*3/uL (ref 0.0–0.7)
Eosinophils Relative: 3.3 % (ref 0.0–5.0)
HCT: 35.1 % — ABNORMAL LOW (ref 36.0–46.0)
Hemoglobin: 12 g/dL (ref 12.0–15.0)
Lymphocytes Relative: 41.4 % (ref 12.0–46.0)
Lymphs Abs: 2.2 10*3/uL (ref 0.7–4.0)
MCHC: 34.1 g/dL (ref 30.0–36.0)
MCV: 96.4 fl (ref 78.0–100.0)
Monocytes Absolute: 0.5 10*3/uL (ref 0.1–1.0)
Monocytes Relative: 8.4 % (ref 3.0–12.0)
Neutro Abs: 2.5 10*3/uL (ref 1.4–7.7)
Neutrophils Relative %: 46.2 % (ref 43.0–77.0)
Platelets: 228 10*3/uL (ref 150.0–400.0)
RBC: 3.64 Mil/uL — ABNORMAL LOW (ref 3.87–5.11)
RDW: 13.9 % (ref 11.5–15.5)
WBC: 5.4 10*3/uL (ref 4.0–10.5)

## 2023-01-29 LAB — HEMOGLOBIN A1C: Hgb A1c MFr Bld: 5.5 % (ref 4.6–6.5)

## 2023-01-29 MED ORDER — HYDRALAZINE HCL 25 MG PO TABS: 25.0000 mg | ORAL_TABLET | Freq: Three times a day (TID) | ORAL | 0 refills | Status: DC

## 2023-01-29 NOTE — Patient Instructions (Signed)

## 2023-01-29 NOTE — Progress Notes (Signed)
Subjective:  Patient ID: Leah Stafford, female    DOB: 1934/02/19  Age: 89 y.o. MRN: [id number]  CC: Annual Exam, Hypertension, Diabetes, and Anemia   HPI [name] presents for a CPX and f/up ----  She walks every day and has good endurance.  She denies DOE, chest pain, shortness of breath, diaphoresis, palpitations, or edema.  Outpatient Medications Prior to Visit  Medication Sig Dispense Refill   acetaminophen (TYLENOL) 500 MG tablet Take 2 tablets (1,000 mg total) every 8 (eight) hours as needed by mouth for moderate pain. 30 tablet 0   carvedilol (COREG) 6.25 MG tablet Take 1 tablet (6.25 mg total) by mouth 2 (two) times daily. 180 tablet 3   Cholecalciferol (VITAMIN D PO) Take 5,000 Units by mouth daily. OTC, strength not known      conjugated estrogens (PREMARIN) vaginal cream Place pea-sized amount of cream to the vagina every night for 2 weeks and then 2 times a week thereafter.     DUREZOL 0.05 % EMUL Inject 1 drop into the eye daily.     Elastic Bandages & Supports (BACK SUPPORT) MISC as needed (pain).     estradiol (ESTRACE) 0.1 MG/GM vaginal cream Insert pea-sized amount per vagina every other night     Incontinence Supply Disposable (ASSURANCE UNDERWEAR WOMENS) MISC daily.     Multiple Vitamin (MULTIVITAMIN WITH MINERALS) TABS tablet Take 1 tablet by mouth daily.     rosuvastatin (CRESTOR) 5 MG tablet Take 1 tablet (5 mg total) by mouth daily. 90 tablet 3   vitamin C (ASCORBIC ACID) 500 MG tablet Take 500 mg by mouth daily.     lisinopril (ZESTRIL) 20 MG tablet TAKE 1 TABLET EVERY DAY 90 tablet 1   No facility-administered medications prior to visit.    ROS Review of Systems  Constitutional: Negative.  Negative for chills, diaphoresis, fatigue and fever.  HENT: Negative.    Eyes: Negative.   Respiratory:  Negative for cough, chest tightness, shortness of breath and wheezing.   Cardiovascular:  Negative for chest pain, palpitations and leg swelling.   Gastrointestinal:  Negative for abdominal pain, constipation, diarrhea, nausea and vomiting.  Endocrine: Negative.   Genitourinary: Negative.  Negative for difficulty urinating.  Musculoskeletal: Negative.  Negative for arthralgias and myalgias.  Skin: Negative.   Neurological:  Negative for dizziness, weakness and light-headedness.  Hematological:  Negative for adenopathy. Does not bruise/bleed easily.  Psychiatric/Behavioral: Negative.      Objective:  BP (!) 164/78 (BP Location: Right Arm, Patient Position: Sitting, Cuff Size: Normal)   Pulse (!) 50   Temp 98.3 F (36.8 C) (Oral)   Ht '5\' 2"'$  (1.575 m)   Wt 135 lb (61.2 kg)   SpO2 97%   BMI 24.69 kg/m   BP Readings from Last 3 Encounters:  01/29/23 (!) 164/78  09/26/22 120/62  08/23/22 134/70    Wt Readings from Last 3 Encounters:  01/29/23 135 lb (61.2 kg)  09/26/22 139 lb (63 kg)  08/23/22 139 lb (63 kg)    Physical Exam Vitals reviewed.  HENT:     Nose: Nose normal.     Mouth/Throat:     Mouth: Mucous membranes are moist.  Eyes:     General: No scleral icterus.    Conjunctiva/sclera: Conjunctivae normal.  Cardiovascular:     Rate and Rhythm: Normal rate and regular rhythm.     Heart sounds: No murmur heard. Pulmonary:     Effort: Pulmonary effort is normal.  Breath sounds: No stridor. No wheezing, rhonchi or rales.  Abdominal:     General: Abdomen is flat.     Palpations: There is no mass.     Tenderness: There is no abdominal tenderness. There is no guarding.     Hernia: No hernia is present.  Musculoskeletal:        General: Normal range of motion.     Cervical back: Neck supple.     Right lower leg: No edema.     Left lower leg: No edema.  Lymphadenopathy:     Cervical: No cervical adenopathy.  Skin:    General: Skin is warm and dry.  Neurological:     Mental Status: She is alert. Mental status is at baseline.  Psychiatric:        Mood and Affect: Mood normal.        Behavior: Behavior  normal.     Lab Results  Component Value Date   WBC 5.4 01/29/2023   HGB 12.0 01/29/2023   HCT 35.1 (L) 01/29/2023   PLT 228.0 01/29/2023   GLUCOSE 105 (H) 01/29/2023   CHOL 147 01/03/2023   TRIG 49 01/03/2023   HDL 82 01/03/2023   LDLDIRECT 100 (H) 09/26/2022   LDLCALC 54 01/03/2023   ALT 23 01/03/2023   AST 23 11/21/2021   NA 139 01/29/2023   K 3.8 01/29/2023   CL 103 01/29/2023   CREATININE 1.14 01/29/2023   BUN 11 01/29/2023   CO2 29 01/29/2023   TSH 2.29 08/23/2022   HGBA1C 5.5 01/29/2023    No results found.  Assessment & Plan:   Jakila was seen today for annual exam, hypertension, diabetes and anemia.  Diagnoses and all orders for this visit:  Primary hypertension- She has not achieved her blood pressure goal.  Will start hydralazine. -     Basic metabolic panel; Future -     CBC with Differential/Platelet; Future -     hydrALAZINE (APRESOLINE) 25 MG tablet; Take 1 tablet (25 mg total) by mouth 3 (three) times daily. 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 -     Basic metabolic panel; Future -     Hemoglobin A1c; Future -     Hemoglobin A1c -     Basic metabolic panel  Anemia due to stage 4 chronic kidney disease ([place])- Her anemia has improved. -     CBC with Differential/Platelet; Future -     CBC with Differential/Platelet  Encounter for general adult medical examination with abnormal findings- Exam completed, labs reviewed, vaccines reviewed, no cancer screenings indicated, patient education was given.   I have discontinued Ashyia Addair's  lisinopril. I am also having her start on hydrALAZINE. Additionally, I am having her maintain her multivitamin with minerals, Cholecalciferol (VITAMIN D PO), Durezol, ascorbic acid, acetaminophen, Assurance Underwear Womens, Back Support, conjugated estrogens, estradiol, carvedilol, and rosuvastatin.  Meds ordered this encounter  Medications   hydrALAZINE (APRESOLINE) 25 MG tablet    Sig: Take 1 tablet (25 mg total) by mouth 3 (three)  times daily.    Dispense:  270 tablet    Refill:  0     Follow-up: Return in about 3 months (around 05/01/2023).  [name], MD

## 2023-02-06 ENCOUNTER — Telehealth: Payer: Self-pay | Admitting: Internal Medicine

## 2023-02-06 NOTE — Telephone Encounter (Signed)
[employer] called with patient on line states patient went to see a podiatrist, Gardiner Barefoot, on 12/12/22 at [facility], states that the provider is in network but the facility is not and they are asking that a referral be sent over, the fax number is [phone]. If any questions patient can be reached at [phone].

## 2023-02-11 ENCOUNTER — Other Ambulatory Visit: Payer: Self-pay | Admitting: Internal Medicine

## 2023-02-18 ENCOUNTER — Other Ambulatory Visit: Payer: Self-pay | Admitting: Internal Medicine

## 2023-02-18 DIAGNOSIS — E118 Type 2 diabetes mellitus with unspecified complications: Secondary | ICD-10-CM

## 2023-02-25 ENCOUNTER — Ambulatory Visit: Payer: Medicare HMO | Admitting: Podiatry

## 2023-03-12 ENCOUNTER — Encounter: Payer: Self-pay | Admitting: Podiatry

## 2023-03-12 ENCOUNTER — Ambulatory Visit (INDEPENDENT_AMBULATORY_CARE_PROVIDER_SITE_OTHER): Payer: Medicare HMO | Admitting: Podiatry

## 2023-03-12 DIAGNOSIS — R6889 Other general symptoms and signs: Secondary | ICD-10-CM | POA: Diagnosis not present

## 2023-03-12 DIAGNOSIS — B351 Tinea unguium: Secondary | ICD-10-CM

## 2023-03-12 DIAGNOSIS — M79675 Pain in left toe(s): Secondary | ICD-10-CM | POA: Diagnosis not present

## 2023-03-12 DIAGNOSIS — M79674 Pain in right toe(s): Secondary | ICD-10-CM

## 2023-03-12 DIAGNOSIS — E119 Type 2 diabetes mellitus without complications: Secondary | ICD-10-CM | POA: Diagnosis not present

## 2023-03-12 NOTE — Progress Notes (Signed)
This patient returns to my office for at risk foot care.  This patient requires this care by a professional since this patient will be at risk due to having diabetes and kidney disease.   This patient is unable to cut nails herself since the patient cannot reach her nails.These nails are painful walking and wearing shoes.  This patient presents for at risk foot care today.  General Appearance  Alert, conversant and in no acute stress.  Vascular  Dorsalis pedis and posterior tibial  pulses are weakly palpable  bilaterally.  Capillary return is within normal limits  bilaterally. Temperature is within normal limits  bilaterally.  Neurologic  Senn-Weinstein monofilament wire test within normal limits  bilaterally. Muscle power within normal limits bilaterally.  Nails Thick disfigured discolored nails with subungual debris  from hallux to fifth toes bilaterally. No evidence of bacterial infection or drainage bilaterally.  Orthopedic  No limitations of motion  feet .  No crepitus or effusions noted.  HAV  B/L.  HT 2,3  B/L.  Skin  normotropic skin with no porokeratosis noted bilaterally.  No signs of infections or ulcers noted.     Onychomycosis  Pain in right toes  Pain in left toes  Consent was obtained for treatment procedures.   Mechanical debridement of nails 1-5  bilaterally performed with a nail nipper.  Filed with dremel without incident.     Return office visit   10 weeks                   Told patient to return for periodic foot care and evaluation due to potential at risk complications.   Tierre Netto DPM   

## 2023-04-01 DIAGNOSIS — R6889 Other general symptoms and signs: Secondary | ICD-10-CM | POA: Diagnosis not present

## 2023-04-15 ENCOUNTER — Other Ambulatory Visit: Payer: Self-pay | Admitting: Internal Medicine

## 2023-04-15 DIAGNOSIS — I10 Essential (primary) hypertension: Secondary | ICD-10-CM

## 2023-05-21 ENCOUNTER — Encounter: Payer: Self-pay | Admitting: Podiatry

## 2023-05-21 ENCOUNTER — Ambulatory Visit (INDEPENDENT_AMBULATORY_CARE_PROVIDER_SITE_OTHER): Payer: Medicare HMO | Admitting: Podiatry

## 2023-05-21 DIAGNOSIS — R6889 Other general symptoms and signs: Secondary | ICD-10-CM | POA: Diagnosis not present

## 2023-05-21 DIAGNOSIS — E119 Type 2 diabetes mellitus without complications: Secondary | ICD-10-CM | POA: Diagnosis not present

## 2023-05-21 DIAGNOSIS — M79674 Pain in right toe(s): Secondary | ICD-10-CM

## 2023-05-21 DIAGNOSIS — M79675 Pain in left toe(s): Secondary | ICD-10-CM

## 2023-05-21 DIAGNOSIS — B351 Tinea unguium: Secondary | ICD-10-CM | POA: Diagnosis not present

## 2023-05-21 NOTE — Progress Notes (Signed)
This patient returns to my office for at risk foot care.  This patient requires this care by a professional since this patient will be at risk due to having diabetes and kidney disease.   This patient is unable to cut nails herself since the patient cannot reach her nails.These nails are painful walking and wearing shoes.  This patient presents for at risk foot care today.  General Appearance  Alert, conversant and in no acute stress.  Vascular  Dorsalis pedis and posterior tibial  pulses are weakly palpable  bilaterally.  Capillary return is within normal limits  bilaterally. Temperature is within normal limits  bilaterally.  Neurologic  Senn-Weinstein monofilament wire test within normal limits  bilaterally. Muscle power within normal limits bilaterally.  Nails Thick disfigured discolored nails with subungual debris  from hallux to fifth toes bilaterally. No evidence of bacterial infection or drainage bilaterally.  Orthopedic  No limitations of motion  feet .  No crepitus or effusions noted.  HAV  B/L.  HT 2,3  B/L.  Skin  normotropic skin with no porokeratosis noted bilaterally.  No signs of infections or ulcers noted.     Onychomycosis  Pain in right toes  Pain in left toes  Consent was obtained for treatment procedures.   Mechanical debridement of nails 1-5  bilaterally performed with a nail nipper.  Filed with dremel without incident.     Return office visit   10 weeks                   Told patient to return for periodic foot care and evaluation due to potential at risk complications.   Legrand Lasser DPM   

## 2023-05-29 ENCOUNTER — Encounter: Payer: Self-pay | Admitting: Physician Assistant

## 2023-05-29 ENCOUNTER — Other Ambulatory Visit (INDEPENDENT_AMBULATORY_CARE_PROVIDER_SITE_OTHER): Payer: Medicare HMO

## 2023-05-29 ENCOUNTER — Ambulatory Visit (INDEPENDENT_AMBULATORY_CARE_PROVIDER_SITE_OTHER): Payer: Medicare HMO | Admitting: Physician Assistant

## 2023-05-29 DIAGNOSIS — M25511 Pain in right shoulder: Secondary | ICD-10-CM

## 2023-05-29 MED ORDER — LIDOCAINE HCL 1 % IJ SOLN
2.0000 mL | INTRAMUSCULAR | Status: AC | PRN
Start: 2023-05-29 — End: 2023-05-29
  Administered 2023-05-29: 2 mL

## 2023-05-29 MED ORDER — METHYLPREDNISOLONE ACETATE 40 MG/ML IJ SUSP
80.0000 mg | INTRAMUSCULAR | Status: AC | PRN
Start: 2023-05-29 — End: 2023-05-29
  Administered 2023-05-29: 80 mg via INTRA_ARTICULAR

## 2023-05-29 MED ORDER — BUPIVACAINE HCL 0.25 % IJ SOLN
2.0000 mL | INTRAMUSCULAR | Status: AC | PRN
  Administered 2023-05-29: 2 mL via INTRA_ARTICULAR

## 2023-05-29 NOTE — Progress Notes (Signed)
Office Visit Note   Patient: Leah Stafford           Date of Birth: February 16, 1934           MRN: [id number] Visit Date: 05/29/2023              Requested by: Etta Grandchild, MD [address] [place],  [place] [address] PCP: Etta Grandchild, MD  Right shoulder pain    HPI: Suki is a former patient of Dr. Cleophas Dunker.  She comes in with her family today complaining of right superior shoulder pain.  Denies any injuries.  Her daughter notes that she is having difficulty sleeping because it hurts.  She also has decreased range of motion.  She is right-hand dominant.  She did have difficulties with his right shoulder in 2018 and had a subacromial injection which significantly improved her symptoms  Assessment & Plan: Visit Diagnoses:  1. Right shoulder pain, unspecified chronicity     Plan: Had discussion with her daughter.  I think she has arthritis of the [facility] joint.  She does have some weakness of the arm and significant pain with overhead and behind her back.  I would like for her to have an injection today.  Hopefully this will improve her ability to move her arm.  If she still had difficulties they will call me in a week because I think she would need some physical therapy at that point.  Cannot rule out that she has had a rotator cuff tear but either way she would benefit more from conservative treatment  Follow-Up Instructions: No follow-ups on file.   Ortho Exam  Patient is alert, oriented, no adenopathy, well-dressed, normal affect, normal respiratory effort. Examination of her right shoulder she has no redness no erythema no deformity.  She is neurovascular intact good radial pulse good grip strength.  She is focally tender over the [facility] joint and into the posterior lateral shoulder.  She can come up to about 90 degrees but then uses her other hand to assist because because it is painful.  Passively I can only get her slightly further to 110 degrees.  She has positive impingement  findings.  She can rotate her arm behind her back to about her back pocket.  She has fair strength with resisted external and internal rotation.  Poor strength with resisted abduction no radicular findings  Imaging: XR Shoulder Right  Result Date: 05/29/2023 Radiographs of her right shoulder were obtained today.  Humeral head is sitting without any evidence of trauma or fracture in the glenoid fossa.  She does have quite a bit of AC arthritis with some calcific tendinitis.  Minimal degenerative changes of the glenohumeral joint  No images are attached to the encounter.  Labs: Lab Results  Component Value Date   HGBA1C 5.5 01/29/2023   HGBA1C 5.9 11/21/2021   HGBA1C 5.9 (A) 01/13/2020   ESRSEDRATE 126 (H) 06/25/2014   LABURIC 5.1 10/07/2017   LABURIC 8.9 (H) 06/25/2014     Lab Results  Component Value Date   ALBUMIN 3.9 11/21/2021   ALBUMIN 4.0 01/13/2020   ALBUMIN 4.0 07/15/2019    No results found for: "MG" No results found for: "VD25OH"  No results found for: "PREALBUMIN"    Latest Ref Rng & Units 01/29/2023    2:08 PM 08/23/2022    3:37 PM 11/21/2021    3:27 PM  CBC EXTENDED  WBC 4.0 - 10.5 K/uL 5.4  5.5  4.5   RBC  3.87 - 5.11 Mil/uL 3.64  3.74  3.64   Hemoglobin 12.0 - 15.0 g/dL 16.1  09.6  04.5   HCT 36.0 - 46.0 % 35.1  35.9  34.7   Platelets 150.0 - 400.0 K/uL 228.0  232.0  232.0   NEUT# 1.4 - 7.7 K/uL 2.5  2.1  2.1   Lymph# 0.7 - 4.0 K/uL 2.2  2.6  1.7      There is no height or weight on file to calculate BMI.  Orders:  Orders Placed This Encounter  Procedures  . XR Shoulder Right   No orders of the defined types were placed in this encounter.    Procedures: Large Joint Inj: R subacromial bursa on 05/29/2023 2:19 PM Indications: diagnostic evaluation and pain Details: 25 G 1.5 in needle, posterior approach  Arthrogram: No  Medications: 2 mL lidocaine 1 %; 80 mg methylPREDNISolone acetate 40 MG/ML; 2 mL bupivacaine 0.25 % Outcome: tolerated well, no  immediate complications Procedure, treatment alternatives, risks and benefits explained, specific risks discussed. Consent was given by the patient.    Clinical Data: No additional findings.  ROS:  All other systems negative, except as noted in the HPI. Review of Systems  Objective: Vital Signs: There were no vitals taken for this visit.  Specialty Comments:  No specialty comments available.  PMFS History: Patient Active Problem List   Diagnosis Date Noted  . Pain in right shoulder 05/29/2023  . Bradycardia 08/23/2022  . Anemia due to stage 4 chronic kidney disease (HCC) 11/22/2021  . Encounter for general adult medical examination with abnormal findings 11/21/2021  . Primary osteoarthritis of left knee 07/29/2017  . Primary osteoarthritis of left shoulder 07/29/2017  . 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 Pure hypercholesterolemia 02/12/2017  . Vitreous hemorrhage of right eye (HCC) 02/07/2016  . Left posterior capsular opacification 08/02/2015  . Pseudophakia of both eyes 08/02/2015  . Diabetic macular edema (HCC) 07/20/2013  . Type 2 diabetes mellitus with diabetic nephropathy, without long-term current use of insulin (HCC) 04/06/2013  . Diabetes mellitus with ophthalmic manifestation (HCC) 04/06/2013  . Hypertension 03/30/2013   Past Medical History:  Diagnosis Date  . Arthritis   . Cataract   . Depression   . Diabetes mellitus without complication (HCC)    Diet and excercise  . Diabetic retinopathy (HCC)   . High cholesterol   . Hypertension     Family History  Problem Relation Age of Onset  . Cancer Father   . Hyperlipidemia Sister   . Hypertension Sister   . Hypertension Brother   . Hyperlipidemia Brother   . Heart disease Brother     Past Surgical History:  Procedure Laterality Date  . EYE SURGERY    . TUBAL LIGATION     Social History   Occupational History  . Occupation: retired  Tobacco Use  . Smoking status: Never  . Smokeless tobacco:  Former    Types: Snuff  Vaping Use  . Vaping Use: Never used  Substance and Sexual Activity  . Alcohol use: No  . Drug use: No  . Sexual activity: Not Currently

## 2023-06-13 DIAGNOSIS — Z4689 Encounter for fitting and adjustment of other specified devices: Secondary | ICD-10-CM | POA: Diagnosis not present

## 2023-06-13 DIAGNOSIS — R351 Nocturia: Secondary | ICD-10-CM | POA: Diagnosis not present

## 2023-06-13 DIAGNOSIS — R6889 Other general symptoms and signs: Secondary | ICD-10-CM | POA: Diagnosis not present

## 2023-07-10 DIAGNOSIS — R6889 Other general symptoms and signs: Secondary | ICD-10-CM | POA: Diagnosis not present

## 2023-07-10 DIAGNOSIS — E113553 Type 2 diabetes mellitus with stable proliferative diabetic retinopathy, bilateral: Secondary | ICD-10-CM | POA: Diagnosis not present

## 2023-07-10 DIAGNOSIS — Z961 Presence of intraocular lens: Secondary | ICD-10-CM | POA: Diagnosis not present

## 2023-07-10 DIAGNOSIS — H35371 Puckering of macula, right eye: Secondary | ICD-10-CM | POA: Diagnosis not present

## 2023-07-10 LAB — HM DIABETES EYE EXAM

## 2023-08-12 ENCOUNTER — Ambulatory Visit (INDEPENDENT_AMBULATORY_CARE_PROVIDER_SITE_OTHER): Payer: 59 | Admitting: Podiatry

## 2023-08-12 ENCOUNTER — Encounter: Payer: Self-pay | Admitting: Podiatry

## 2023-08-12 DIAGNOSIS — B351 Tinea unguium: Secondary | ICD-10-CM

## 2023-08-12 DIAGNOSIS — E119 Type 2 diabetes mellitus without complications: Secondary | ICD-10-CM

## 2023-08-12 DIAGNOSIS — M79675 Pain in left toe(s): Secondary | ICD-10-CM

## 2023-08-12 DIAGNOSIS — M79674 Pain in right toe(s): Secondary | ICD-10-CM

## 2023-08-12 NOTE — Progress Notes (Signed)
This patient returns to my office for at risk foot care.  This patient requires this care by a professional since this patient will be at risk due to having diabetes and kidney disease.   This patient is unable to cut nails herself since the patient cannot reach her nails.These nails are painful walking and wearing shoes.  This patient presents for at risk foot care today.  General Appearance  Alert, conversant and in no acute stress.  Vascular  Dorsalis pedis and posterior tibial  pulses are weakly palpable  bilaterally.  Capillary return is within normal limits  bilaterally. Temperature is within normal limits  bilaterally.  Neurologic  Senn-Weinstein monofilament wire test within normal limits  bilaterally. Muscle power within normal limits bilaterally.  Nails Thick disfigured discolored nails with subungual debris  from hallux to fifth toes bilaterally. No evidence of bacterial infection or drainage bilaterally.  Orthopedic  No limitations of motion  feet .  No crepitus or effusions noted.  HAV  B/L.  HT 2,3  B/L.  Skin  normotropic skin with no porokeratosis noted bilaterally.  No signs of infections or ulcers noted.     Onychomycosis  Pain in right toes  Pain in left toes  Consent was obtained for treatment procedures.   Mechanical debridement of nails 1-5  bilaterally performed with a nail nipper.  Filed with dremel without incident.     Return office visit   10 weeks                   Told patient to return for periodic foot care and evaluation due to potential at risk complications.   Helane Gunther DPM

## 2023-09-23 ENCOUNTER — Other Ambulatory Visit: Payer: Self-pay

## 2023-09-23 ENCOUNTER — Telehealth: Payer: Self-pay | Admitting: Internal Medicine

## 2023-09-23 DIAGNOSIS — I10 Essential (primary) hypertension: Secondary | ICD-10-CM

## 2023-09-23 MED ORDER — HYDRALAZINE HCL 25 MG PO TABS: 25.0000 mg | ORAL_TABLET | Freq: Three times a day (TID) | ORAL | 0 refills | Status: DC

## 2023-09-23 MED ORDER — CARVEDILOL 6.25 MG PO TABS: 6.2500 mg | ORAL_TABLET | Freq: Two times a day (BID) | ORAL | 0 refills | Status: DC

## 2023-09-23 NOTE — Telephone Encounter (Signed)
Prescription Request  09/23/2023  LOV: 01/29/2023  What is the name of the medication or equipment? Carvedilol and hydralazine  Have you contacted your pharmacy to request a refill? No   Which pharmacy would you like this sent to?  [facility] - [place], [place] - [address] [place] [address] Phone: [phone] Fax: [phone]    Patient notified that their request is being sent to the clinical staff for review and that they should receive a response within 2 business days.   Please advise at

## 2023-09-23 NOTE — Telephone Encounter (Signed)
 Medication refill sent to Dr. Yetta Barre

## 2023-09-27 ENCOUNTER — Telehealth: Payer: Self-pay | Admitting: Internal Medicine

## 2023-09-27 NOTE — Telephone Encounter (Signed)
Patient called and said she is out of her hydralazine. The prescription was ordered by her PCP on 09/23/2023 to [facility], but she was told it would not arrive for 10 days. She would like to know if a partial refill can be sent to [id number] - [place], [place] - [address] AT [address] [facility] [facility] [address] so she can take her medication. She is worried about having to wait until it is delivered. Patient would like a call back at [phone].

## 2023-09-30 ENCOUNTER — Other Ambulatory Visit: Payer: Self-pay | Admitting: Internal Medicine

## 2023-09-30 DIAGNOSIS — I10 Essential (primary) hypertension: Secondary | ICD-10-CM

## 2023-09-30 MED ORDER — HYDRALAZINE HCL 25 MG PO TABS: 25.0000 mg | ORAL_TABLET | Freq: Three times a day (TID) | ORAL | 0 refills | Status: DC

## 2023-09-30 NOTE — Telephone Encounter (Signed)
Please advise 

## 2023-10-02 NOTE — Telephone Encounter (Signed)
Advised the patient that this medication was refilled and sent to her mail order pharmacy about 9 days ago and she would have to call [facility] to check on the status of her medication. I advised her that Dr. Yetta Barre could send another rx to her local pharmacy but I wasn't sure that her insurance would pay for it considering the fact we already sent it to [facility]. She stated that she would call them and give [place] a call back if she needed to.

## 2023-10-02 NOTE — Telephone Encounter (Signed)
Patient is out of below medication and needs them sent to her local pharmacy at [id number] - [place], [place] - [address] AT [address] [facility] [address].

## 2023-10-08 ENCOUNTER — Ambulatory Visit: Payer: 59 | Attending: Internal Medicine | Admitting: Internal Medicine

## 2023-10-08 ENCOUNTER — Other Ambulatory Visit (HOSPITAL_COMMUNITY): Payer: Self-pay

## 2023-10-08 ENCOUNTER — Encounter: Payer: Self-pay | Admitting: Internal Medicine

## 2023-10-08 VITALS — BP 200/80 | HR 51 | Ht 62.0 in | Wt 144.4 lb

## 2023-10-08 DIAGNOSIS — I160 Hypertensive urgency: Secondary | ICD-10-CM | POA: Insufficient documentation

## 2023-10-08 DIAGNOSIS — E782 Mixed hyperlipidemia: Secondary | ICD-10-CM | POA: Insufficient documentation

## 2023-10-08 DIAGNOSIS — I10 Essential (primary) hypertension: Secondary | ICD-10-CM | POA: Diagnosis not present

## 2023-10-08 DIAGNOSIS — R001 Bradycardia, unspecified: Secondary | ICD-10-CM

## 2023-10-08 DIAGNOSIS — E7800 Pure hypercholesterolemia, unspecified: Secondary | ICD-10-CM

## 2023-10-08 MED ORDER — HYDRALAZINE HCL 50 MG PO TABS
50.0000 mg | ORAL_TABLET | Freq: Three times a day (TID) | ORAL | 3 refills | Status: DC
  Filled 2023-10-08 (×2): qty 270, 90d supply, fill #0

## 2023-10-08 MED ORDER — ROSUVASTATIN CALCIUM 5 MG PO TABS
5.0000 mg | ORAL_TABLET | Freq: Every day | ORAL | 3 refills | Status: DC
  Filled 2023-10-08 (×2): qty 90, 90d supply, fill #0

## 2023-10-08 NOTE — Patient Instructions (Signed)
Medication Instructions:  Your physician has recommended you make the following change in your medication:  STOP: carvedilol  INCREASE: Hydralazine to 50 mg by mouth three times daily  *If you need a refill on your cardiac medications before your next appointment, please call your pharmacy*   Lab Work: NONE If you have labs (blood work) drawn today and your tests are completely normal, you will receive your results only by: MyChart Message (if you have MyChart) OR A paper copy in the mail If you have any lab test that is abnormal or we need to change your treatment, we will call you to review the results.   Testing/Procedures: IN 1-2 WEEKS: Nurse Visit for Blood Pressure Check.   Your physician has requested that you have a Pharmacy Clinic Office Visit for Blood Pressure.   Follow-Up: At [facility], you and your health needs are our priority.  As part of our continuing mission to provide you with exceptional heart care, we have created designated Provider Care Teams.  These Care Teams include your primary Cardiologist (physician) and Advanced Practice Providers (APPs -  Physician Assistants and Nurse Practitioners) who all work together to provide you with the care you need, when you need it.   Your next appointment:   3 month(s)  Provider:   Eligha Bridegroom, NP, Tereso Newcomer, PA-C, or Perlie Gold, PA-C     Then, Christell Constant, MD will plan to see you again in 1 year(s).

## 2023-10-08 NOTE — Progress Notes (Signed)
Cardiology Office Note:    Date:  10/08/2023   ID:  Leah Stafford, DOB 1934-04-23, MRN [id number]  PCP:  Etta Grandchild, MD   [facility] Providers Cardiologist:  Christell Constant, MD     Referring MD: Etta Grandchild, MD   CC: asymptomatic bradycardia  on BB  History of Present Illness:    Leah Stafford is a 89 y.o. female with a hx of bradycardia NOS, HTN, HLD, CKD stage IV. 2023: Busy at [employer].  BP SBP 120-130s.  Discussed the use of AI scribe software for clinical note transcription with the patient, who gave verbal consent to proceed.  Leah Stafford, an 89 year old with a history of hypertension, sinus bradycardia, hyperlipidemia, and CKD stage four, presents for a routine follow-up. Previously, she was managed on carvedilol, rosuvastatin, and hydralazine. However, she has been off all her medications for approximately two weeks. During this period, her blood pressure has significantly increased to 200/80, although she remains asymptomatic. She has been active in her church and maintains a level of physical activity, albeit reduced compared to the previous year. There is no report of any new symptoms or complications.   Past Medical History:  Diagnosis Date   Arthritis    Cataract    Depression    Diabetes mellitus without complication (HCC)    Diet and excercise   Diabetic retinopathy (HCC)    High cholesterol    Hypertension     Past Surgical History:  Procedure Laterality Date   EYE SURGERY     TUBAL LIGATION      Current Medications: Current Meds  Medication Sig   acetaminophen (TYLENOL) 500 MG tablet Take 2 tablets (1,000 mg total) every 8 (eight) hours as needed by mouth for moderate pain.   Cholecalciferol (VITAMIN D PO) Take 5,000 Units by mouth daily. OTC, strength not known    conjugated estrogens (PREMARIN) vaginal cream Place pea-sized amount of cream to the vagina every night for 2 weeks and then 2 times a week  thereafter.   DUREZOL 0.05 % EMUL Inject 1 drop into the eye daily.   Elastic Bandages & Supports (BACK SUPPORT) MISC as needed (pain).   hydrALAZINE (APRESOLINE) 50 MG tablet Take 1 tablet (50 mg total) by mouth 3 (three) times daily.   Incontinence Supply Disposable (ASSURANCE UNDERWEAR WOMENS) MISC daily.   Multiple Vitamin (MULTIVITAMIN WITH MINERALS) TABS tablet Take 1 tablet by mouth daily.   vitamin C (ASCORBIC ACID) 500 MG tablet Take 500 mg by mouth daily.   [DISCONTINUED] carvedilol (COREG) 6.25 MG tablet Take 1 tablet (6.25 mg total) by mouth 2 (two) times daily.   [DISCONTINUED] hydrALAZINE (APRESOLINE) 25 MG tablet Take 1 tablet (25 mg total) by mouth 3 (three) times daily.   [DISCONTINUED] rosuvastatin (CRESTOR) 5 MG tablet Take 1 tablet (5 mg total) by mouth daily.     Allergies:   Codeine   Social History   Socioeconomic History   Marital status: Widowed    Spouse name: Not on file   Number of children: 3   Years of education: Not on file   Highest education level: Not on file  Occupational History   Occupation: retired  Tobacco Use   Smoking status: Never   Smokeless tobacco: Former    Types: Snuff  Vaping Use   Vaping status: Never Used  Substance and Sexual Activity   Alcohol use: No   Drug use: No   Sexual activity: Not Currently  Other Topics Concern   Not on file  Social History Narrative   Marital status: widowed; not dating in 2018      Children: 3 children; 9 grandchildren; 8 gg.      Lives: alone; daughter five minutes per day.      Employment: retired.       Tobacco: none      Alcohol: none      Exercise: previous walking; no walking during winter months.      ADLs: no driving.  Cleans, washes clothes; cleans house; pays bills.  Grocery shopping with daughter.  No assistant devices in 2018.       Advanced Directives:  None;  FULL CODE; daughter HCPOA.         Social Determinants of Health   Financial Resource Strain: Low Risk  (11/23/2021)    Overall Financial Resource Strain (CARDIA)    Difficulty of Paying Living Expenses: Not hard at all  Food Insecurity: No Food Insecurity (11/23/2021)   Hunger Vital Sign    Worried About Running Out of Food in the Last Year: Never true    Ran Out of Food in the Last Year: Never true  Transportation Needs: No Transportation Needs (11/23/2021)   PRAPARE - Administrator, Civil Service (Medical): No    Lack of Transportation (Non-Medical): No  Physical Activity: Sufficiently Active (11/23/2021)   Exercise Vital Sign    Days of Exercise per Week: 7 days    Minutes of Exercise per Session: 40 min  Stress: No Stress Concern Present (11/23/2021)   [employer] of Occupational Health - Occupational Stress Questionnaire    Feeling of Stress : Not at all  Social Connections: Moderately Integrated (11/23/2021)   Social Connection and Isolation Panel [NHANES]    Frequency of Communication with Friends and Family: More than three times a week    Frequency of Social Gatherings with Friends and Family: More than three times a week    Attends Religious Services: More than 4 times per year    Active Member of [employer] or Organizations: Yes    Attends Banker Meetings: More than 4 times per year    Marital Status: Widowed     Family History: The patient's family history includes Cancer in her father; Heart disease in her brother; Hyperlipidemia in her brother and sister; Hypertension in her brother and sister.  ROS:   Please see the history of present illness.     EKGs/Labs/Other Studies Reviewed:    The following studies were reviewed today:   EKG:   08/23/22: marked sinus bradycardia heart rate 42  Recent Labs: 01/03/2023: ALT 23 01/29/2023: BUN 11; Creatinine, Ser 1.14; Hemoglobin 12.0; Platelets 228.0; Potassium 3.8; Sodium 139  Recent Lipid Panel    Component Value Date/Time   CHOL 147 01/03/2023 1253   TRIG 49 01/03/2023 1253   HDL 82 01/03/2023 1253    CHOLHDL 1.8 01/03/2023 1253   LDLCALC 54 01/03/2023 1253   LDLDIRECT 100 (H) 09/26/2022 1543    Physical Exam:    VS:  BP (!) 200/80   Pulse (!) 51   Ht 5\' 2"  (1.575 m)   Wt 144 lb 6.4 oz (65.5 kg)   SpO2 96%   BMI 26.41 kg/m     Wt Readings from Last 3 Encounters:  10/08/23 144 lb 6.4 oz (65.5 kg)  01/29/23 135 lb (61.2 kg)  09/26/22 139 lb (63 kg)    Repeat Blood pressure 19080  GEN:  Well nourished, well developed in no acute distress HEENT: Normal NECK: No JVD; CARDIAC: Regular bradycardia, no murmurs, rubs, gallops +3 pulses RESPIRATORY:  Clear to auscultation without rales, wheezing or rhonchi  ABDOMEN: Soft, non-tender, non-distended MUSCULOSKELETAL:  No edema; No deformity  SKIN: Warm and dry NEUROLOGIC:  Alert and oriented x 3 PSYCHIATRIC:  Normal affect   ASSESSMENT:    1. Hypertensive urgency   2. Primary hypertension   3. Sinus bradycardia   4. Mixed hyperlipidemia     PLAN:    Hypertension Severe hypertension with BP 200/80 mmHg, asymptomatic but at risk for stroke. Likely due to discontinuation of antihypertensives. Discussed risks of stroke and chest pain. Emphasized medication adherence and gradual BP reduction.; discussed that if she has any symptoms of chest pain, syncope or neurologic pain, she should go to the ED, we consider ED evaluation for her but she denies any symptoms  - Restart hydralazine at  50 mg TID - Monitor BP in 1-2 weeks (Nurse visit) - Refer to PharmD hypertension clinic - Schedule follow-up in 3 months with PA/NP  Sinus Bradycardia - Asymptomatic sinus bradycardia, previously managed with carvedilol, discontinued due to low heart rate. - Do not restart carvedilol at this time  Hyperlipidemia Mildly elevated cholesterol, previously managed with low dose rosuvastatin, showing slight improvement. - Restart rosuvastatin 5 mg  Chronic Kidney Disease Stage 4 CKD stage 4 managed by regular doctor. - Continue current management  with PCP; I am unclear why she is no longer seeing nephrology         Medication Adjustments/Labs and Tests Ordered: Current medicines are reviewed at length with the patient today.  Concerns regarding medicines are outlined above.  Orders Placed This Encounter  Procedures   AMB Referral to [facility] Pharm-D   EKG 12-Lead   Meds ordered this encounter  Medications   rosuvastatin (CRESTOR) 5 MG tablet    Sig: Take 1 tablet (5 mg total) by mouth daily.    Dispense:  90 tablet    Refill:  3   hydrALAZINE (APRESOLINE) 50 MG tablet    Sig: Take 1 tablet (50 mg total) by mouth 3 (three) times daily.    Dispense:  270 tablet    Refill:  3    There are no Patient Instructions on file for this visit.   Signed, Christell Constant, MD  10/08/2023 3:39 PM    [facility]

## 2023-10-09 ENCOUNTER — Other Ambulatory Visit (HOSPITAL_COMMUNITY): Payer: Self-pay

## 2023-10-10 ENCOUNTER — Other Ambulatory Visit: Payer: Self-pay

## 2023-10-11 ENCOUNTER — Telehealth: Payer: Self-pay | Admitting: Internal Medicine

## 2023-10-11 NOTE — Telephone Encounter (Signed)
Called pt in regards to results.  Advised pt we have not ordered any testing.  reviewed EKG results.   Pt reports BP is better and chest feels more relaxed.  160's/60's.  Pt had no further concerns at this time.

## 2023-10-11 NOTE — Telephone Encounter (Signed)
Pt is requesting a callback regarding her results. Please advise

## 2023-10-14 ENCOUNTER — Ambulatory Visit (INDEPENDENT_AMBULATORY_CARE_PROVIDER_SITE_OTHER): Payer: 59 | Admitting: Podiatry

## 2023-10-14 ENCOUNTER — Encounter: Payer: Self-pay | Admitting: Podiatry

## 2023-10-14 DIAGNOSIS — M79675 Pain in left toe(s): Secondary | ICD-10-CM

## 2023-10-14 DIAGNOSIS — E119 Type 2 diabetes mellitus without complications: Secondary | ICD-10-CM | POA: Diagnosis not present

## 2023-10-14 DIAGNOSIS — M79674 Pain in right toe(s): Secondary | ICD-10-CM | POA: Diagnosis not present

## 2023-10-14 DIAGNOSIS — B351 Tinea unguium: Secondary | ICD-10-CM | POA: Diagnosis not present

## 2023-10-14 MED ORDER — AMMONIUM LACTATE 12 % EX LOTN: TOPICAL_LOTION | CUTANEOUS | Status: DC | PRN

## 2023-10-14 NOTE — Addendum Note (Signed)
Addended by: Jonni Sanger on: 10/14/2023 03:52 PM   Modules accepted: Orders

## 2023-10-14 NOTE — Addendum Note (Signed)
Addended by: Helane Gunther on: 10/14/2023 03:20 PM   Modules accepted: Orders

## 2023-10-14 NOTE — Progress Notes (Addendum)
This patient returns to my office for at risk foot care.  This patient requires this care by a professional since this patient will be at risk due to having diabetes and kidney disease.   This patient is unable to cut nails herself since the patient cannot reach her nails.These nails are painful walking and wearing shoes.  This patient presents for at risk foot care today.  General Appearance  Alert, conversant and in no acute stress.  Vascular  Dorsalis pedis and posterior tibial  pulses are weakly palpable  bilaterally.  Capillary return is within normal limits  bilaterally. Temperature is within normal limits  bilaterally.  Neurologic  Senn-Weinstein monofilament wire test within normal limits  bilaterally. Muscle power within normal limits bilaterally.  Nails Thick disfigured discolored nails with subungual debris  from hallux to fifth toes bilaterally. No evidence of bacterial infection or drainage bilaterally.  Orthopedic  No limitations of motion  feet .  No crepitus or effusions noted.  HAV  B/L.  HT 2,3  B/L.  Skin  normotropic skin with no porokeratosis noted bilaterally.  No signs of infections or ulcers noted.     Onychomycosis  Pain in right toes  Pain in left toes  Consent was obtained for treatment procedures.   Mechanical debridement of nails 1-5  bilaterally performed with a nail nipper.  Filed with dremel without incident.  Prescribe lac-hydrin.    Return office visit   10 weeks                   Told patient to return for periodic foot care and evaluation due to potential at risk complications.   Helane Gunther DPM

## 2023-10-16 ENCOUNTER — Telehealth: Payer: Self-pay | Admitting: Podiatry

## 2023-10-16 MED ORDER — AMMONIUM LACTATE 12 % EX LOTN: 1.0000 | TOPICAL_LOTION | CUTANEOUS | 0 refills | Status: DC | PRN

## 2023-10-16 NOTE — Telephone Encounter (Signed)
Patient called stating that the pharmacy has not gotten the prescription sent in for the 12% Lotion and they're requesting you resend the prescription  Thanks !

## 2023-10-16 NOTE — Addendum Note (Signed)
Addended by: Helane Gunther on: 10/16/2023 04:13 PM   Modules accepted: Orders

## 2023-10-18 ENCOUNTER — Ambulatory Visit: Payer: 59 | Attending: Cardiovascular Disease | Admitting: Internal Medicine

## 2023-10-18 VITALS — BP 162/70 | HR 62 | Ht 62.0 in | Wt 143.6 lb

## 2023-10-18 DIAGNOSIS — I10 Essential (primary) hypertension: Secondary | ICD-10-CM | POA: Diagnosis not present

## 2023-10-18 MED ORDER — HYDRALAZINE HCL 50 MG PO TABS: 75.0000 mg | ORAL_TABLET | Freq: Three times a day (TID) | ORAL | Status: DC

## 2023-10-18 NOTE — Patient Instructions (Signed)
Medication Instructions:  Your physician has recommended you make the following change in your medication:   1) INCREASE hydralazine to 75mg  three times daily (take 1.5 tablets three times daily)  *If you need a refill on your cardiac medications before your next appointment, please call your pharmacy*  Lab Work: None ordered today.  Testing/Procedures: None ordered today.  Follow-Up: As scheduled

## 2023-10-18 NOTE — Progress Notes (Unsigned)
   Nurse Visit   Date of Encounter: 10/18/2023 ID: Raenette Musch, DOB Feb 08, 1934, MRN [id number]  PCP:  Etta Grandchild, MD    [facility] Providers Cardiologist:  Christell Constant, MD { Click to update primary MD,subspecialty MD or APP then REFRESH:1}     Visit Details   VS:  BP (!) 162/70 (BP Location: Left Arm, Patient Position: Sitting, Cuff Size: Normal)   Pulse 62   Ht 5\' 2"  (1.575 m)   Wt 143 lb 9.6 oz (65.1 kg)   BMI 26.26 kg/m  , BMI Body mass index is 26.26 kg/m.  Wt Readings from Last 3 Encounters:  10/18/23 143 lb 9.6 oz (65.1 kg)  10/08/23 144 lb 6.4 oz (65.5 kg)  01/29/23 135 lb (61.2 kg)     Reason for visit: BP Check/HTN Performed today: Vitals, Provider consulted:Dr. Izora Ribas, and Education Changes (medications, testing, etc.) : increase Hydralazine to 75mg  TID, F/U with Pharm D in 2 weeks Length of Visit: 35 minutes  Patient is here today for nurse visit to recheck BP after elevated reading and medication changes at last office visit. BP today in clinic 162/70. Patient took last dose of hydralazine 50mg  this morning around 8:30am and will take next dose around 4:30pm. Discussed with primary cardiologist in clinic, Dr. Izora Ribas, who recommended to increase hydralazine to 75mg  TID. Follow-up with Pharm D in [facility] Clinic in 2 weeks, appointment is scheduled for 10/30/23. Patient will continue to monitor BP at home until next appointment with Pharm D.  Medications Adjustments/Labs and Tests Ordered: No orders of the defined types were placed in this encounter.  Meds ordered this encounter  Medications   hydrALAZINE (APRESOLINE) 50 MG tablet    Sig: Take 1.5 tablets (75 mg total) by mouth 3 (three) times daily.    Dose change (increased)     Signed, Franchot Gallo, RN  10/18/2023 3:43 PM

## 2023-10-22 ENCOUNTER — Ambulatory Visit: Payer: 59 | Admitting: Internal Medicine

## 2023-10-22 ENCOUNTER — Encounter: Payer: Self-pay | Admitting: Internal Medicine

## 2023-10-22 VITALS — BP 148/66 | HR 83 | Temp 98.4°F | Resp 16 | Ht 62.0 in | Wt 143.8 lb

## 2023-10-22 DIAGNOSIS — I10 Essential (primary) hypertension: Secondary | ICD-10-CM | POA: Diagnosis not present

## 2023-10-22 DIAGNOSIS — D631 Anemia in chronic kidney disease: Secondary | ICD-10-CM

## 2023-10-22 DIAGNOSIS — N184 Chronic kidney disease, stage 4 (severe): Secondary | ICD-10-CM | POA: Diagnosis not present

## 2023-10-22 LAB — BASIC METABOLIC PANEL
BUN: 11 mg/dL (ref 6–23)
CO2: 29 meq/L (ref 19–32)
Calcium: 9.8 mg/dL (ref 8.4–10.5)
Chloride: 106 meq/L (ref 96–112)
Creatinine, Ser: 1.3 mg/dL — ABNORMAL HIGH (ref 0.40–1.20)
GFR: 36.94 mL/min — ABNORMAL LOW (ref >60.00)
Glucose, Bld: 122 mg/dL — ABNORMAL HIGH (ref 70–99)
Potassium: 3.5 meq/L (ref 3.5–5.1)
Sodium: 142 meq/L (ref 135–145)

## 2023-10-22 LAB — URINALYSIS, ROUTINE W REFLEX MICROSCOPIC
Bilirubin Urine: NEGATIVE
Hgb urine dipstick: NEGATIVE
Ketones, ur: NEGATIVE
Nitrite: NEGATIVE
Specific Gravity, Urine: 1.01 (ref 1.000–1.030)
Total Protein, Urine: 30 — AB
Urine Glucose: NEGATIVE
Urobilinogen, UA: 0.2 (ref 0.0–1.0)
pH: 6 (ref 5.0–8.0)

## 2023-10-22 LAB — CBC WITH DIFFERENTIAL/PLATELET
Basophils Absolute: 0.1 10*3/uL (ref 0.0–0.1)
Basophils Relative: 1.2 % (ref 0.0–3.0)
Eosinophils Absolute: 0.2 10*3/uL (ref 0.0–0.7)
Eosinophils Relative: 5.3 % — ABNORMAL HIGH (ref 0.0–5.0)
HCT: 36 % (ref 36.0–46.0)
Hemoglobin: 12.3 g/dL (ref 12.0–15.0)
Lymphocytes Relative: 37.4 % (ref 12.0–46.0)
Lymphs Abs: 1.6 10*3/uL (ref 0.7–4.0)
MCHC: 34.1 g/dL (ref 30.0–36.0)
MCV: 97.3 fL (ref 78.0–100.0)
Monocytes Absolute: 0.4 10*3/uL (ref 0.1–1.0)
Monocytes Relative: 10.6 % (ref 3.0–12.0)
Neutro Abs: 1.9 10*3/uL (ref 1.4–7.7)
Neutrophils Relative %: 45.5 % (ref 43.0–77.0)
Platelets: 284 10*3/uL (ref 150.0–400.0)
RBC: 3.7 Mil/uL — ABNORMAL LOW (ref 3.87–5.11)
RDW: 14.4 % (ref 11.5–15.5)
WBC: 4.3 10*3/uL (ref 4.0–10.5)

## 2023-10-22 LAB — HEPATIC FUNCTION PANEL
ALT: 15 U/L (ref 0–35)
AST: 26 U/L (ref 0–37)
Albumin: 4.2 g/dL (ref 3.5–5.2)
Alkaline Phosphatase: 71 U/L (ref 39–117)
Bilirubin, Direct: 0.2 mg/dL (ref 0.0–0.3)
Total Bilirubin: 0.8 mg/dL (ref 0.2–1.2)
Total Protein: 7.8 g/dL (ref 6.0–8.3)

## 2023-10-22 NOTE — Patient Instructions (Signed)
Hypertension, Adult High blood pressure (hypertension) is when the force of blood pumping through the arteries is too strong. The arteries are the blood vessels that carry blood from the heart throughout the body. Hypertension forces the heart to work harder to pump blood and may cause arteries to become narrow or stiff. Untreated or uncontrolled hypertension can lead to a heart attack, heart failure, a stroke, kidney disease, and other problems. A blood pressure reading consists of a higher number over a lower number. Ideally, your blood pressure should be below 120/80. The first ("top") number is called the systolic pressure. It is a measure of the pressure in your arteries as your heart beats. The second ("bottom") number is called the diastolic pressure. It is a measure of the pressure in your arteries as the heart relaxes. What are the causes? The exact cause of this condition is not known. There are some conditions that result in high blood pressure. What increases the risk? Certain factors may make you more likely to develop high blood pressure. Some of these risk factors are under your control, including: Smoking. Not getting enough exercise or physical activity. Being overweight. Having too much fat, sugar, calories, or salt (sodium) in your diet. Drinking too much alcohol. Other risk factors include: Having a personal history of heart disease, diabetes, high cholesterol, or kidney disease. Stress. Having a family history of high blood pressure and high cholesterol. Having obstructive sleep apnea. Age. The risk increases with age. What are the signs or symptoms? High blood pressure may not cause symptoms. Very high blood pressure (hypertensive crisis) may cause: Headache. Fast or irregular heartbeats (palpitations). Shortness of breath. Nosebleed. Nausea and vomiting. Vision changes. Severe chest pain, dizziness, and seizures. How is this diagnosed? This condition is diagnosed by  measuring your blood pressure while you are seated, with your arm resting on a flat surface, your legs uncrossed, and your feet flat on the floor. The cuff of the blood pressure monitor will be placed directly against the skin of your upper arm at the level of your heart. Blood pressure should be measured at least twice using the same arm. Certain conditions can cause a difference in blood pressure between your right and left arms. If you have a high blood pressure reading during one visit or you have normal blood pressure with other risk factors, you may be asked to: Return on a different day to have your blood pressure checked again. Monitor your blood pressure at home for 1 week or longer. If you are diagnosed with hypertension, you may have other blood or imaging tests to help your health care provider understand your overall risk for other conditions. How is this treated? This condition is treated by making healthy lifestyle changes, such as eating healthy foods, exercising more, and reducing your alcohol intake. You may be referred for counseling on a healthy diet and physical activity. Your health care provider may prescribe medicine if lifestyle changes are not enough to get your blood pressure under control and if: Your systolic blood pressure is above 130. Your diastolic blood pressure is above 80. Your personal target blood pressure may vary depending on your medical conditions, your age, and other factors. Follow these instructions at home: Eating and drinking  Eat a diet that is high in fiber and potassium, and low in sodium, added sugar, and fat. An example of this eating plan is called the DASH diet. DASH stands for Dietary Approaches to Stop Hypertension. To eat this way: Eat   plenty of fresh fruits and vegetables. Try to fill one half of your plate at each meal with fruits and vegetables. Eat whole grains, such as whole-wheat pasta, brown rice, or whole-grain bread. Fill about one  fourth of your plate with whole grains. Eat or drink low-fat dairy products, such as skim milk or low-fat yogurt. Avoid fatty cuts of meat, processed or cured meats, and poultry with skin. Fill about one fourth of your plate with lean proteins, such as fish, chicken without skin, beans, eggs, or tofu. Avoid pre-made and processed foods. These tend to be higher in sodium, added sugar, and fat. Reduce your daily sodium intake. Many people with hypertension should eat less than 1,500 mg of sodium a day. Do not drink alcohol if: Your health care provider tells you not to drink. You are pregnant, may be pregnant, or are planning to become pregnant. If you drink alcohol: Limit how much you have to: 0-1 drink a day for women. 0-2 drinks a day for men. Know how much alcohol is in your drink. In the [place] one drink equals one 12 oz bottle of beer (355 mL), one 5 oz glass of wine (148 mL), or one 1 oz glass of hard liquor (44 mL). Lifestyle  Work with your health care provider to maintain a healthy body weight or to lose weight. Ask what an ideal weight is for you. Get at least 30 minutes of exercise that causes your heart to beat faster (aerobic exercise) most days of the week. Activities may include walking, swimming, or biking. Include exercise to strengthen your muscles (resistance exercise), such as Pilates or lifting weights, as part of your weekly exercise routine. Try to do these types of exercises for 30 minutes at least 3 days a week. Do not use any products that contain nicotine or tobacco. These products include cigarettes, chewing tobacco, and vaping devices, such as e-cigarettes. If you need help quitting, ask your health care provider. Monitor your blood pressure at home as told by your health care provider. Keep all follow-up visits. This is important. Medicines Take over-the-counter and prescription medicines only as told by your health care provider. Follow directions carefully. Blood  pressure medicines must be taken as prescribed. Do not skip doses of blood pressure medicine. Doing this puts you at risk for problems and can make the medicine less effective. Ask your health care provider about side effects or reactions to medicines that you should watch for. Contact a health care provider if you: Think you are having a reaction to a medicine you are taking. Have headaches that keep coming back (recurring). Feel dizzy. Have swelling in your ankles. Have trouble with your vision. Get help right away if you: Develop a severe headache or confusion. Have unusual weakness or numbness. Feel faint. Have severe pain in your chest or abdomen. Vomit repeatedly. Have trouble breathing. These symptoms may be an emergency. Get help right away. Call 911. Do not wait to see if the symptoms will go away. Do not drive yourself to the hospital. Summary Hypertension is when the force of blood pumping through your arteries is too strong. If this condition is not controlled, it may put you at risk for serious complications. Your personal target blood pressure may vary depending on your medical conditions, your age, and other factors. For most people, a normal blood pressure is less than 120/80. Hypertension is treated with lifestyle changes, medicines, or a combination of both. Lifestyle changes include losing weight, eating a healthy,   low-sodium diet, exercising more, and limiting alcohol. This information is not intended to replace advice given to you by your health care provider. Make sure you discuss any questions you have with your health care provider. Document Revised: 09/19/2021 Document Reviewed: 09/19/2021 Elsevier Patient Education  2024 [employer]  

## 2023-10-22 NOTE — Progress Notes (Signed)
Subjective:  Patient ID: Leah Stafford, female    DOB: 12-30-1933  Age: 89 y.o. MRN: [id number]  CC: Hypertension   HPI Leah Stafford presents for f/up ----  Discussed the use of AI scribe software for clinical note transcription with the patient, who gave verbal consent to proceed.  History of Present Illness   The patient, with a history of hypertension and heart disease, reports overall good health with no complaints of headache, blurred vision, chest pain, or shortness of breath. She notes occasional mild lower extremity edema, which she attributes to periods of inactivity.  She recently had a consultation with a cardiologist due to elevated blood pressure readings. A new antihypertensive medication was initiated, which has since resulted in a significant reduction in blood pressure.   She also reports occasional difficulty swallowing while eating, which she attributes to eating too quickly. She denies any associated pain, difficulty swallowing pills, or weight loss.  The patient's weight has been stable, fluctuating around 143-144 lbs. She has no complaints of pain and has agreed to undergo lab work.       Outpatient Medications Prior to Visit  Medication Sig Dispense Refill   acetaminophen (TYLENOL) 500 MG tablet Take 2 tablets (1,000 mg total) every 8 (eight) hours as needed by mouth for moderate pain. 30 tablet 0   ammonium lactate (LAC-HYDRIN) 12 % lotion Apply 1 Application topically as needed for dry skin. 225 g 0   Cholecalciferol (VITAMIN D PO) Take 5,000 Units by mouth daily. OTC, strength not known      conjugated estrogens (PREMARIN) vaginal cream Place pea-sized amount of cream to the vagina every night for 2 weeks and then 2 times a week thereafter.     DUREZOL 0.05 % EMUL Inject 1 drop into the eye daily.     Elastic Bandages & Supports (BACK SUPPORT) MISC as needed (pain).     hydrALAZINE (APRESOLINE) 50 MG tablet Take 1.5 tablets (75 mg total) by mouth 3  (three) times daily.     Incontinence Supply Disposable (ASSURANCE UNDERWEAR WOMENS) MISC daily.     Multiple Vitamin (MULTIVITAMIN WITH MINERALS) TABS tablet Take 1 tablet by mouth daily.     rosuvastatin (CRESTOR) 5 MG tablet Take 1 tablet (5 mg total) by mouth daily. 90 tablet 3   vitamin C (ASCORBIC ACID) 500 MG tablet Take 500 mg by mouth daily.     No facility-administered medications prior to visit.    ROS Review of Systems  Constitutional:  Negative for appetite change, fatigue and fever.  HENT:  Positive for trouble swallowing. Negative for voice change.   Eyes: Negative.   Respiratory: Negative.  Negative for cough, chest tightness, shortness of breath and wheezing.   Cardiovascular:  Negative for chest pain, palpitations and leg swelling.  Gastrointestinal:  Negative for abdominal pain, diarrhea, nausea and vomiting.  Endocrine: Negative.   Genitourinary: Negative.  Negative for difficulty urinating.  Musculoskeletal: Negative.   Skin: Negative.   Neurological: Negative.  Negative for dizziness and weakness.  Hematological:  Negative for adenopathy. Does not bruise/bleed easily.  Psychiatric/Behavioral:  Positive for confusion and decreased concentration.     Objective:  BP (!) 148/66 (BP Location: Left Arm, Patient Position: Sitting, Cuff Size: Normal)   Pulse 83   Temp 98.4 F (36.9 C) (Oral)   Resp 16   Ht 5\' 2"  (1.575 m)   Wt 143 lb 12.8 oz (65.2 kg)   SpO2 94%   BMI 26.30 kg/m   BP  Readings from Last 3 Encounters:  10/22/23 (!) 148/66  10/18/23 (!) 162/70  10/08/23 (!) 200/80    Wt Readings from Last 3 Encounters:  10/22/23 143 lb 12.8 oz (65.2 kg)  10/18/23 143 lb 9.6 oz (65.1 kg)  10/08/23 144 lb 6.4 oz (65.5 kg)    Physical Exam Vitals reviewed.  Constitutional:      Appearance: Normal appearance.  HENT:     Mouth/Throat:     Mouth: Mucous membranes are moist.  Eyes:     General: No scleral icterus.    Conjunctiva/sclera: Conjunctivae  normal.  Cardiovascular:     Rate and Rhythm: Normal rate and regular rhythm.     Heart sounds: No murmur heard.    No gallop.  Pulmonary:     Effort: Pulmonary effort is normal.     Breath sounds: No stridor. No wheezing, rhonchi or rales.  Abdominal:     General: Abdomen is flat.     Palpations: There is no mass.     Tenderness: There is no abdominal tenderness. There is no guarding.     Hernia: No hernia is present.  Musculoskeletal:        General: Normal range of motion.     Cervical back: Neck supple.     Right lower leg: No edema.     Left lower leg: No edema.  Lymphadenopathy:     Cervical: No cervical adenopathy.  Skin:    General: Skin is warm and dry.  Neurological:     General: No focal deficit present.     Mental Status: She is alert. Mental status is at baseline.  Psychiatric:        Mood and Affect: Mood normal.        Behavior: Behavior normal.     Lab Results  Component Value Date   WBC 4.3 10/22/2023   HGB 12.3 10/22/2023   HCT 36.0 10/22/2023   PLT 284.0 10/22/2023   GLUCOSE 122 (H) 10/22/2023   CHOL 147 01/03/2023   TRIG 49 01/03/2023   HDL 82 01/03/2023   LDLDIRECT 100 (H) 09/26/2022   LDLCALC 54 01/03/2023   ALT 15 10/22/2023   AST 26 10/22/2023   NA 142 10/22/2023   K 3.5 10/22/2023   CL 106 10/22/2023   CREATININE 1.30 (H) 10/22/2023   BUN 11 10/22/2023   CO2 29 10/22/2023   TSH 2.29 08/23/2022   HGBA1C 5.5 01/29/2023    No results found.  Assessment & Plan:   Primary hypertension - Her BP is adequately well controlled. -     Basic metabolic panel; Future -     CBC with Differential/Platelet; Future -     Urinalysis, Routine w reflex microscopic; Future -     Hepatic function panel; Future  CKD (chronic kidney disease) stage 4, GFR 15-29 ml/min (HCC)- Renal function is stable.  Anemia due to stage 4 chronic kidney disease (HCC)- H/H are normal.     Follow-up: Return in about 6 months (around 04/20/2024).  Sanda Linger,  MD

## 2023-10-30 ENCOUNTER — Ambulatory Visit: Payer: 59 | Attending: Internal Medicine | Admitting: Student

## 2023-10-30 ENCOUNTER — Encounter: Payer: Self-pay | Admitting: Student

## 2023-10-30 VITALS — BP 148/64 | HR 75 | Ht 62.0 in | Wt 143.0 lb

## 2023-10-30 DIAGNOSIS — I10 Essential (primary) hypertension: Secondary | ICD-10-CM | POA: Diagnosis not present

## 2023-10-30 MED ORDER — HYDRALAZINE HCL 100 MG PO TABS: 100.0000 mg | ORAL_TABLET | Freq: Two times a day (BID) | ORAL | 3 refills | Status: DC

## 2023-10-30 NOTE — Patient Instructions (Addendum)
Changes made by your pharmacist Carmela Hurt, PharmD at today's visit:    Instructions/Changes  (what do you need to do) Your Notes  (what you did and when you did it)  Increase hydralazine dose from 1.5 tablet three times daily to 2 tab (100 mg) three times daily    Check BP once a day and bring BP log at your next office visit    Continue following low salt diet and exercise regularly     Bring all of your meds, your BP cuff and your record of home blood pressures to your next appointment.    HOW TO TAKE YOUR BLOOD PRESSURE AT HOME  Rest 5 minutes before taking your blood pressure.  Don't smoke or drink caffeinated beverages for at least 30 minutes before. Take your blood pressure before (not after) you eat. Sit comfortably with your back supported and both feet on the floor (don't cross your legs). Elevate your arm to heart level on a table or a desk. Use the proper sized cuff. It should fit smoothly and snugly around your bare upper arm. There should be enough room to slip a fingertip under the cuff. The bottom edge of the cuff should be 1 inch above the crease of the elbow. Ideally, take 3 measurements at one sitting and record the average.  Important lifestyle changes to control high blood pressure  Intervention  Effect on the BP  Lose extra pounds and watch your waistline Weight loss is one of the most effective lifestyle changes for controlling blood pressure. If you're overweight or obese, losing even a small amount of weight can help reduce blood pressure. Blood pressure might go down by about 1 millimeter of mercury (mm Hg) with each kilogram (about 2.2 pounds) of weight lost.  Exercise regularly As a general goal, aim for at least 30 minutes of moderate physical activity every day. Regular physical activity can lower high blood pressure by about 5 to 8 mm Hg.  Eat a healthy diet Eating a diet rich in whole grains, fruits, vegetables, and low-fat dairy products and low in  saturated fat and cholesterol. A healthy diet can lower high blood pressure by up to 11 mm Hg.  Reduce salt (sodium) in your diet Even a small reduction of sodium in the diet can improve heart health and reduce high blood pressure by about 5 to 6 mm Hg.  Limit alcohol One drink equals 12 ounces of beer, 5 ounces of wine, or 1.5 ounces of 80-proof liquor.  Limiting alcohol to less than one drink a day for women or two drinks a day for men can help lower blood pressure by about 4 mm Hg.   If you have any questions or concerns please use My Chart to send questions or call the office at [phone]

## 2023-10-30 NOTE — Progress Notes (Signed)
Patient ID: Leah Stafford                 DOB: [birth year]                      MRN: [id number]      HPI: Tanvika Addeo is a 89 y.o. female referred by Dr. Izora Ribas to HTN clinic. PMH is significant for hypertension, T2DM with complication, HLD, CKD stage 4, anemia duet to CKD.   Patient was in to see Dr.Chandrasekhar on 11/12, BP was elevated 200/80 carvedilol 6.25 mg twice daily was changed to hydralazine 50 mg three times daily. Patient has hx of medication non-adherence. She remains asymptomatic when her BP is elevated. Patient saw nurse 10 days alter and her BP was still up so hydralazine dose were increased to 75 mg three times daily, patient were ask to keep log for home BP and bring it to today's appointment with PharmD  Today patient presented for BP follow up. Did not bring home BP log, recalls her home BP ~140-160/ 60-70 heart rate ~65. Have noted her BP coming down since she started hydralazine 75 mg three times daily. Some days she forget to take her 4 pm dose as she get distracted with other activity and end up taking it late. We set reminder on her phone to improve adherence. Follows low salt diet eats out once or twice a week and exercise regularly ( walks 1 mile and chair exercises 3 times week)   Current HTN meds: hydralazine 75 mg three times daily  Previously tried: carvedilol 6.25 mg twice daily  BP goal: <130/80 due to advance age can consider lenient goal <140/90   Family History:  Relation Problem Comments  Mother (Deceased)   Father (Deceased) Cancer     Sister Metallurgist) Hyperlipidemia   Hypertension     Sister Metallurgist)   Brother (Deceased) Heart disease   Hyperlipidemia   Hypertension      Social History:  Alcohol: none  Smoking: never   Diet: does not use salt, eats out once or twice a month. Likes fish -sometimes fried  Exercise: chair exercise and walking 1 mile per day    Home BP readings:  ~140-160/ 60-70 heart rate ~65   Wt Readings from  Last 3 Encounters:  10/30/23 143 lb (64.9 kg)  10/22/23 143 lb 12.8 oz (65.2 kg)  10/18/23 143 lb 9.6 oz (65.1 kg)   BP Readings from Last 3 Encounters:  10/30/23 (!) 148/64  10/22/23 (!) 148/66  10/18/23 (!) 162/70   Pulse Readings from Last 3 Encounters:  10/30/23 75  10/22/23 83  10/18/23 62    Renal function: Estimated Creatinine Clearance: 27 mL/min (A) (by C-G formula based on SCr of 1.3 mg/dL (H)).  Past Medical History:  Diagnosis Date   Arthritis    Cataract    Depression    Diabetes mellitus without complication (HCC)    Diet and excercise   Diabetic retinopathy (HCC)    High cholesterol    Hypertension     Current Outpatient Medications on File Prior to Visit  Medication Sig Dispense Refill   acetaminophen (TYLENOL) 500 MG tablet Take 2 tablets (1,000 mg total) every 8 (eight) hours as needed by mouth for moderate pain. 30 tablet 0   ammonium lactate (LAC-HYDRIN) 12 % lotion Apply 1 Application topically as needed for dry skin. 225 g 0   Cholecalciferol (VITAMIN D PO) Take 5,000 Units by mouth daily. OTC, strength not  known      conjugated estrogens (PREMARIN) vaginal cream Place pea-sized amount of cream to the vagina every night for 2 weeks and then 2 times a week thereafter.     DUREZOL 0.05 % EMUL Inject 1 drop into the eye daily.     Elastic Bandages & Supports (BACK SUPPORT) MISC as needed (pain).     Incontinence Supply Disposable (ASSURANCE UNDERWEAR WOMENS) MISC daily.     Multiple Vitamin (MULTIVITAMIN WITH MINERALS) TABS tablet Take 1 tablet by mouth daily.     rosuvastatin (CRESTOR) 5 MG tablet Take 1 tablet (5 mg total) by mouth daily. 90 tablet 3   No current facility-administered medications on file prior to visit.    Allergies  Allergen Reactions   Codeine Nausea And Vomiting and Other (See Comments)    Too sleepy    Blood pressure (!) 148/64, pulse 75, height 5\' 2"  (1.575 m), weight 143 lb (64.9 kg), SpO2 99%.   Assessment/Plan:  1.  Hypertension -  Hypertension Assessment: BP is uncontrolled in office BP 148/64 mmHg heart rate 75 Forgot to bring home BP log recalls home BP ~140-160/ 60-70 heart rate ~65 Given advance age can consider lenient BP goal  Tolerates hydralazine increased dose well without any side effects Set reminder on phone for mid day hydralazine to improve adherence  Denies SOB, palpitation, chest pain, headaches,or swelling Reiterated the importance of regular exercise and low salt diet   Plan:  Start taking hydralazine 50 mg 2 tablets three times daily and when receive 100 mg tablets take 1 tab three times daily  In future if BP remains elevated can consider adding amlodipine low dose  Check BP at home at least once a  day and bring BP log at next OV Patient to bring BP monitor to validate at next OV Patient to see PharmD in 4 weeks for follow up  Follow up lab(s) none     Thank you  Carmela Hurt, Pharm.D [facility] A Division of  [facility] [address] [place], [place] [address]  Phone: [phone]; Fax: [phone]

## 2023-10-30 NOTE — Assessment & Plan Note (Signed)
Assessment: BP is uncontrolled in office BP 148/64 mmHg heart rate 75 Forgot to bring home BP log recalls home BP ~140-160/ 60-70 heart rate ~65 Given advance age can consider lenient BP goal  Tolerates hydralazine increased dose well without any side effects Set reminder on phone for mid day hydralazine to improve adherence  Denies SOB, palpitation, chest pain, headaches,or swelling Reiterated the importance of regular exercise and low salt diet   Plan:  Start taking hydralazine 50 mg 2 tablets three times daily and when receive 100 mg tablets take 1 tab three times daily  In future if BP remains elevated can consider adding amlodipine low dose  Check BP at home at least once a  day and bring BP log at next OV Patient to bring BP monitor to validate at next OV Patient to see PharmD in 4 weeks for follow up  Follow up lab(s) none

## 2023-11-26 ENCOUNTER — Other Ambulatory Visit (HOSPITAL_COMMUNITY): Payer: Self-pay

## 2023-11-28 ENCOUNTER — Ambulatory Visit: Payer: 59 | Attending: Cardiovascular Disease | Admitting: Pharmacist

## 2023-11-28 VITALS — BP 134/62 | HR 68

## 2023-11-28 DIAGNOSIS — I10 Essential (primary) hypertension: Secondary | ICD-10-CM | POA: Diagnosis not present

## 2023-11-28 MED ORDER — HYDRALAZINE HCL 100 MG PO TABS: 100.0000 mg | ORAL_TABLET | Freq: Three times a day (TID) | ORAL | 3 refills | Status: DC

## 2023-11-28 NOTE — Assessment & Plan Note (Signed)
 Assessment: Blood pressure well controlled in clinic today at goal of <140/90 Patient reports home BP at goal. Does not bring in any specific readings or cuff Reports improved technique at home (avoiding checking close to drinking coffee) Hasn't been walking as much and wants to get back to this  Plan: Continue hydralazine  100mg  TID. New rx for 100mg  TID sent to pharmacy- reminded her new rx will be 100mg  and she is to take 1 tablet TID (currently taking 2 of the 50mg  TID) Reminded patient of the importance of continuing to take her blood pressure medications

## 2023-11-28 NOTE — Patient Instructions (Addendum)
 Your blood pressure goal is < 140/17mmHg   Please continue taking a total of 100mg  of hydralazine  three times a day.  Increase your walking.   Important lifestyle changes to control high blood pressure  Intervention  Effect on the BP   Weight loss Weight loss is one of the most effective lifestyle changes for controlling blood pressure. If you're overweight or obese, losing even a small amount of weight can help reduce blood pressure.    Blood pressure can decrease by 1 millimeter of mercury (mmHg) with each kilogram (about 2.2 pounds) of weight lost.   Exercise regularly As a general goal, aim for 30 minutes of moderate physical activity every day.    Regular physical activity can lower blood pressure by 5 - 8 mmHg.   Eat a healthy diet Eat a diet rich in whole grains, fruits, vegetables, lean meat, and low-fat dairy products. Limit processed foods, saturated fat, and sweets.    A heart-healthy diet can lower high blood pressure by 10 mmHg.   Reduce salt (sodium) in your diet Aim for 000mg  of sodium each day. Avoid deli meats, canned food, and frozen microwave meals which are high in sodium.     Limiting sodium can reduce blood pressure by 5 mmHg.   Limit alcohol  One drink equals 12 ounces of beer, 5 ounces of wine, or 1.5 ounces of 80-proof liquor.    Limiting alcohol  to < 1 drink a day for women or < 2 drinks a day for men can help lower blood pressure by about 4 mmHg.   To check your pressure at home you will need to:   Sit up in a chair, with feet flat on the floor and back supported. Do not cross your ankles or legs. Rest your left arm so that the cuff is about heart level. If the cuff goes on your upper arm, then just relax your arm on the table, arm of the chair, or your lap. If you have a wrist cuff, hold your wrist against your chest at heart level. Place the cuff snugly around your arm, about 1 inch above the crease of your elbow. The cords should be inside the  groove of your elbow.  Sit quietly, with the cuff in place, for about 5 minutes. Then press the power button to start a reading. Do not talk or move while the reading is taking place.  Record your readings on a sheet of paper. Although most cuffs have a memory, it is often easier to see a pattern developing when the numbers are all in front of you.  You can repeat the reading after 1-3 minutes if it is recommended.   Make sure your bladder is empty and you have not had caffeine or tobacco within the last 30 minutes   Always bring your blood pressure log with you to your appointments. If you have not brought your monitor in to be double checked for accuracy, please bring it to your next appointment.   You can find a list of validated (accurate) blood pressure cuffs at: validatebp.org

## 2023-11-28 NOTE — Progress Notes (Signed)
 Patient ID: Leah Stafford                 DOB: 1934-08-07                      MRN: [id number]      HPI: Leah Stafford is a 89 y.o. female referred by Dr. Santo to HTN clinic. PMH is significant for hypertension, T2DM with complication, HLD, CKD stage 4, anemia duet to CKD.   Patient was in to see Dr.Chandrasekhar on 11/12, BP was elevated 200/80. Carvedilol  6.25 mg twice daily was changed to hydralazine  50 mg three times daily. Patient has hx of medication non-adherence. She remains asymptomatic when her BP is elevated. Patient saw nurse 10 days after and her BP was still up so hydralazine  dose was increased to 75 mg three times daily. Patient was asked to keep log for home BP and bring it to appointment with PharmD  At appointment with PharmD on 12/4, she did not bring home BP log, recalled her home BP ~140-160/ 60-70 heart rate ~65.  Some days she forget to take her 4 pm dose as she get distracted with other activity and end up taking it late. We set reminder on her phone to improve adherence. Hydralazine  was increased to 100mg  three times a day.  Patient presents today to clinic for follow up. She denies any dizziness or headaches. Minor swelling in legs from time to time that improves on its own. States she hasn't been walking as much as she normal does and needs to start back. Taking hydralazine  100mg  three times a day. Denies missed doses. Is remembering her 4 PM dose with help from an alarm. Reports home BP 130-140/60-70's.  She states she hopes not to be on this medication long. Advised this is a long term medication, lifestyle can help, but not uncommon to have to be on blood pressure medication to keep blood pressure down. Advised stopping medication could significantly increase blood pressure.   Current HTN meds: hydralazine  100 mg three times daily  Previously tried: carvedilol  6.25 mg twice daily (bradycardia) BP goal: <130/80 due to advance age can consider lenient goal  <140/90   Family History:  Relation Problem Comments  Mother (Deceased)   Father (Deceased) Cancer     Sister Metallurgist) Hyperlipidemia   Hypertension     Sister Metallurgist)   Brother (Deceased) Heart disease   Hyperlipidemia   Hypertension      Social History:  Alcohol : none  Smoking: never   Diet: does not use salt, eats out once or twice a month. Likes fish -sometimes fried  Exercise: chair exercise and walking 1 mile per day    Home BP readings:  ~130-140/ 60-70 heart rate ~65   Wt Readings from Last 3 Encounters:  10/30/23 143 lb (64.9 kg)  10/22/23 143 lb 12.8 oz (65.2 kg)  10/18/23 143 lb 9.6 oz (65.1 kg)   BP Readings from Last 3 Encounters:  11/28/23 134/62  10/30/23 (!) 148/64  10/22/23 (!) 148/66   Pulse Readings from Last 3 Encounters:  11/28/23 68  10/30/23 75  10/22/23 83    Renal function: CrCl cannot be calculated (Patient's most recent lab result is older than the maximum 21 days allowed.).  Past Medical History:  Diagnosis Date   Arthritis    Cataract    Depression    Diabetes mellitus without complication (HCC)    Diet and excercise   Diabetic retinopathy (HCC)  High cholesterol    Hypertension     Current Outpatient Medications on File Prior to Visit  Medication Sig Dispense Refill   acetaminophen  (TYLENOL ) 500 MG tablet Take 2 tablets (1,000 mg total) every 8 (eight) hours as needed by mouth for moderate pain. 30 tablet 0   ammonium lactate  (LAC-HYDRIN ) 12 % lotion Apply 1 Application topically as needed for dry skin. 225 g 0   Cholecalciferol (VITAMIN D PO) Take 5,000 Units by mouth daily. OTC, strength not known      conjugated estrogens (PREMARIN) vaginal cream Place pea-sized amount of cream to the vagina every night for 2 weeks and then 2 times a week thereafter.     DUREZOL 0.05 % EMUL Inject 1 drop into the eye daily.     Elastic Bandages & Supports (BACK SUPPORT) MISC as needed (pain).     Incontinence Supply Disposable  (ASSURANCE UNDERWEAR WOMENS) MISC daily.     Multiple Vitamin (MULTIVITAMIN WITH MINERALS) TABS tablet Take 1 tablet by mouth daily.     rosuvastatin  (CRESTOR ) 5 MG tablet Take 1 tablet (5 mg total) by mouth daily. 90 tablet 3   No current facility-administered medications on file prior to visit.    Allergies  Allergen Reactions   Codeine Nausea And Vomiting and Other (See Comments)    Too sleepy    Blood pressure 134/62, pulse 68.   Assessment/Plan:  1. Hypertension -  Hypertension Assessment: Blood pressure well controlled in clinic today at goal of <140/90 Patient reports home BP at goal. Does not bring in any specific readings or cuff Reports improved technique at home (avoiding checking close to drinking coffee) Hasn't been walking as much and wants to get back to this  Plan: Continue hydralazine  100mg  TID. New rx for 100mg  TID sent to pharmacy- reminded her new rx will be 100mg  and she is to take 1 tablet TID (currently taking 2 of the 50mg  TID) Reminded patient of the importance of continuing to take her blood pressure medications    Thank you  Windi Toro D Nyelli Samara, Pharm.D, BCACP, CPP [facility] Division of [facility] [address] [place], [place] [address]  Phone: [phone]; Fax: [phone]

## 2023-12-04 ENCOUNTER — Other Ambulatory Visit: Payer: Self-pay

## 2023-12-04 ENCOUNTER — Telehealth: Payer: Self-pay | Admitting: Internal Medicine

## 2023-12-04 ENCOUNTER — Other Ambulatory Visit (HOSPITAL_COMMUNITY): Payer: Self-pay

## 2023-12-04 ENCOUNTER — Other Ambulatory Visit (HOSPITAL_BASED_OUTPATIENT_CLINIC_OR_DEPARTMENT_OTHER): Payer: Self-pay

## 2023-12-04 MED ORDER — HYDRALAZINE HCL 100 MG PO TABS
100.0000 mg | ORAL_TABLET | Freq: Three times a day (TID) | ORAL | 3 refills | Status: DC
  Filled 2023-12-04: qty 248, 82d supply, fill #0
  Filled 2023-12-04: qty 22, 8d supply, fill #0
  Filled 2023-12-04: qty 270, 90d supply, fill #0
  Filled 2024-03-02: qty 270, 90d supply, fill #1
  Filled 2024-06-10: qty 270, 90d supply, fill #2
  Filled 2024-09-03 (×2): qty 270, 90d supply, fill #3
  Filled 2024-09-04: qty 270, 90d supply, fill #0

## 2023-12-04 NOTE — Telephone Encounter (Signed)
 Pt c/o medication issue:  1. Name of Medication:   hydrALAZINE  (APRESOLINE ) 100 MG tablet   2. How are you currently taking this medication (dosage and times per day)?   As prescribed  3. Are you having a reaction (difficulty breathing--STAT)?   4. What is your medication issue?    Patient stated she took her last tablet this morning.  Patient stated she changed her insurance to [facility] and she is unable to afford this medication.  Patient wants assistance getting this medication and wants to get a prescription sent to [facility].  Patient noted she will need to reserve transportation to pick up this medication.

## 2023-12-04 NOTE — Telephone Encounter (Signed)
 Called pt reports has new insurance and she needs our office to send it to  [facility].  Only has 1 pill left. Advised pt to call pharmacy update insurance information. Have pharmacy run the cost of medication to ensure it is affordable.  Then have transportation take her to the pharmacy to pick up medication.  If pt has trouble advised to call our office back.  Pt thanked me for the call all questions answered.

## 2023-12-19 DIAGNOSIS — Z4689 Encounter for fitting and adjustment of other specified devices: Secondary | ICD-10-CM | POA: Diagnosis not present

## 2023-12-24 ENCOUNTER — Ambulatory Visit: Payer: 59 | Admitting: Podiatry

## 2024-01-01 ENCOUNTER — Ambulatory Visit (INDEPENDENT_AMBULATORY_CARE_PROVIDER_SITE_OTHER): Payer: 59 | Admitting: Podiatry

## 2024-01-01 ENCOUNTER — Encounter: Payer: Self-pay | Admitting: Podiatry

## 2024-01-01 DIAGNOSIS — M79674 Pain in right toe(s): Secondary | ICD-10-CM

## 2024-01-01 DIAGNOSIS — B351 Tinea unguium: Secondary | ICD-10-CM | POA: Diagnosis not present

## 2024-01-01 DIAGNOSIS — M79675 Pain in left toe(s): Secondary | ICD-10-CM

## 2024-01-01 DIAGNOSIS — E119 Type 2 diabetes mellitus without complications: Secondary | ICD-10-CM

## 2024-01-01 NOTE — Progress Notes (Signed)
 This patient returns to my office for at risk foot care.  This patient requires this care by a professional since this patient will be at risk due to having diabetes and kidney disease.   This patient is unable to cut nails herself since the patient cannot reach her nails.These nails are painful walking and wearing shoes.  This patient presents for at risk foot care today.  General Appearance  Alert, conversant and in no acute stress.  Vascular  Dorsalis pedis and posterior tibial  pulses are weakly palpable  bilaterally.  Capillary return is within normal limits  bilaterally. Temperature is within normal limits  bilaterally.  Neurologic  Senn-Weinstein monofilament wire test within normal limits  bilaterally. Muscle power within normal limits bilaterally.  Nails Thick disfigured discolored nails with subungual debris  from hallux to fifth toes bilaterally. No evidence of bacterial infection or drainage bilaterally.  Orthopedic  No limitations of motion  feet .  No crepitus or effusions noted.  HAV  B/L.  HT 2,3  B/L.  Skin  normotropic skin with no porokeratosis noted bilaterally.  No signs of infections or ulcers noted.     Onychomycosis  Pain in right toes  Pain in left toes  Consent was obtained for treatment procedures.   Mechanical debridement of nails 1-5  bilaterally performed with a nail nipper.  Filed with dremel without incident.  Prescribe lac-hydrin.    Return office visit   10 weeks                   Told patient to return for periodic foot care and evaluation due to potential at risk complications.   Helane Gunther DPM

## 2024-01-08 ENCOUNTER — Ambulatory Visit: Payer: 59 | Attending: Physician Assistant | Admitting: Physician Assistant

## 2024-01-08 NOTE — Progress Notes (Deleted)
   Cardiology Office Note:    Date:  01/08/2024  ID:  Leah Stafford, DOB 08/01/1934, MRN [id number] PCP: Etta Grandchild, MD  [facility] Providers Cardiologist:  Christell Constant, MD { Click to update primary MD,subspecialty MD or APP then REFRESH:1}    {Click to Open Review  :1}   Patient Profile:      Hypertension  Hyperlipidemia  Diabetes mellitus  Chronic kidney disease, stage IV Sinus bradycardia   beta-blocker DC'd       {      :1}   Leah Stafford is a 89 y.o. female who returns for follow up of HTN. She was last seen by Dr. Izora Ribas in 09/2023. Her BP was uncontrolled and she was referred to our [facility]. Her BP was much better at her last visit 11/28/23. *** ROS-See HPI***    Studies Reviewed:       *** Results    Risk Assessment/Calculations:   {Does this patient have ATRIAL FIBRILLATION?:[phone]} No BP recorded.  {Refresh Note OR Click here to enter BP  :1}***       Physical Exam:   VS:  There were no vitals taken for this visit.   Wt Readings from Last 3 Encounters:  10/30/23 143 lb (64.9 kg)  10/22/23 143 lb 12.8 oz (65.2 kg)  10/18/23 143 lb 9.6 oz (65.1 kg)    Physical Exam***     Assessment and Plan:   Assessment & Plan  Assessment and Plan Assessment & Plan    {      :1}    {Are you ordering a CV Procedure (e.g. stress test, cath, DCCV, TEE, etc)?   Press F2        :[id number]}  Dispo:  No follow-ups on file.  Signed, Tereso Newcomer, PA-C

## 2024-03-02 ENCOUNTER — Other Ambulatory Visit (HOSPITAL_COMMUNITY): Payer: Self-pay

## 2024-03-11 ENCOUNTER — Ambulatory Visit (INDEPENDENT_AMBULATORY_CARE_PROVIDER_SITE_OTHER): Payer: 59 | Admitting: Podiatry

## 2024-03-11 ENCOUNTER — Encounter: Payer: Self-pay | Admitting: Podiatry

## 2024-03-11 DIAGNOSIS — M79674 Pain in right toe(s): Secondary | ICD-10-CM

## 2024-03-11 DIAGNOSIS — M79675 Pain in left toe(s): Secondary | ICD-10-CM

## 2024-03-11 DIAGNOSIS — N184 Chronic kidney disease, stage 4 (severe): Secondary | ICD-10-CM

## 2024-03-11 DIAGNOSIS — E119 Type 2 diabetes mellitus without complications: Secondary | ICD-10-CM

## 2024-03-11 DIAGNOSIS — B351 Tinea unguium: Secondary | ICD-10-CM

## 2024-03-11 NOTE — Progress Notes (Signed)
This patient returns to my office for at risk foot care.  This patient requires this care by a professional since this patient will be at risk due to having diabetes and kidney disease.   This patient is unable to cut nails herself since the patient cannot reach her nails.These nails are painful walking and wearing shoes.  This patient presents for at risk foot care today.  General Appearance  Alert, conversant and in no acute stress.  Vascular  Dorsalis pedis and posterior tibial  pulses are weakly palpable  bilaterally.  Capillary return is within normal limits  bilaterally. Temperature is within normal limits  bilaterally.  Neurologic  Senn-Weinstein monofilament wire test within normal limits  bilaterally. Muscle power within normal limits bilaterally.  Nails Thick disfigured discolored nails with subungual debris  from hallux to fifth toes bilaterally. No evidence of bacterial infection or drainage bilaterally.  Orthopedic  No limitations of motion  feet .  No crepitus or effusions noted.  HAV  B/L.  HT 2,3  B/L.  Skin  normotropic skin with no porokeratosis noted bilaterally.  No signs of infections or ulcers noted.     Onychomycosis  Pain in right toes  Pain in left toes  Consent was obtained for treatment procedures.   Mechanical debridement of nails 1-5  bilaterally performed with a nail nipper.  Filed with dremel without incident.     Return office visit   10 weeks                   Told patient to return for periodic foot care and evaluation due to potential at risk complications.   Helane Gunther DPM

## 2024-05-20 ENCOUNTER — Ambulatory Visit (INDEPENDENT_AMBULATORY_CARE_PROVIDER_SITE_OTHER): Admitting: Podiatry

## 2024-05-20 DIAGNOSIS — M79674 Pain in right toe(s): Secondary | ICD-10-CM

## 2024-05-20 DIAGNOSIS — E119 Type 2 diabetes mellitus without complications: Secondary | ICD-10-CM

## 2024-05-20 DIAGNOSIS — B351 Tinea unguium: Secondary | ICD-10-CM

## 2024-05-20 DIAGNOSIS — M79675 Pain in left toe(s): Secondary | ICD-10-CM

## 2024-05-20 NOTE — Progress Notes (Signed)
This patient returns to my office for at risk foot care.  This patient requires this care by a professional since this patient will be at risk due to having diabetes and kidney disease.   This patient is unable to cut nails herself since the patient cannot reach her nails.These nails are painful walking and wearing shoes.  This patient presents for at risk foot care today.  General Appearance  Alert, conversant and in no acute stress.  Vascular  Dorsalis pedis and posterior tibial  pulses are weakly palpable  bilaterally.  Capillary return is within normal limits  bilaterally. Temperature is within normal limits  bilaterally.  Neurologic  Senn-Weinstein monofilament wire test within normal limits  bilaterally. Muscle power within normal limits bilaterally.  Nails Thick disfigured discolored nails with subungual debris  from hallux to fifth toes bilaterally. No evidence of bacterial infection or drainage bilaterally.  Orthopedic  No limitations of motion  feet .  No crepitus or effusions noted.  HAV  B/L.  HT 2,3  B/L.  Skin  normotropic skin with no porokeratosis noted bilaterally.  No signs of infections or ulcers noted.     Onychomycosis  Pain in right toes  Pain in left toes  Consent was obtained for treatment procedures.   Mechanical debridement of nails 1-5  bilaterally performed with a nail nipper.  Filed with dremel without incident.     Return office visit   10 weeks                   Told patient to return for periodic foot care and evaluation due to potential at risk complications.   Helane Gunther DPM

## 2024-06-10 ENCOUNTER — Other Ambulatory Visit (HOSPITAL_COMMUNITY): Payer: Self-pay

## 2024-06-10 ENCOUNTER — Telehealth: Payer: Self-pay | Admitting: Internal Medicine

## 2024-06-10 NOTE — Telephone Encounter (Signed)
*  STAT* If patient is at the pharmacy, call can be transferred to refill team.   1. Which medications need to be refilled? (please list name of each medication and dose if known)   hydrALAZINE  (APRESOLINE ) 100 MG tablet   2. Would you like to learn more about the convenience, safety, & potential cost savings by using the [facility]?   3. Are you open to using the [facility] (Type [facility]. ).  4. Which pharmacy/location (including street and city if local pharmacy) is medication to be sent to?  [place] [facility]   5. Do they need a 30 day or 90 day supply?   90 day supply.  Caller [employer]) stated patient is almost out of this medication.

## 2024-06-10 NOTE — Telephone Encounter (Signed)
 RX sent in on 12/04/23 for a year supply. Called Pharmacy to confirm and [facility] is refilling. RX should be sent out soon.

## 2024-06-18 DIAGNOSIS — Z4689 Encounter for fitting and adjustment of other specified devices: Secondary | ICD-10-CM | POA: Diagnosis not present

## 2024-07-21 ENCOUNTER — Ambulatory Visit (INDEPENDENT_AMBULATORY_CARE_PROVIDER_SITE_OTHER): Admitting: Internal Medicine

## 2024-07-21 ENCOUNTER — Encounter: Payer: Self-pay | Admitting: Internal Medicine

## 2024-07-21 ENCOUNTER — Ambulatory Visit (INDEPENDENT_AMBULATORY_CARE_PROVIDER_SITE_OTHER)

## 2024-07-21 VITALS — BP 146/86 | HR 68 | Temp 98.4°F | Resp 16 | Ht 63.5 in | Wt 138.6 lb

## 2024-07-21 VITALS — BP 158/72 | HR 68 | Ht 63.5 in | Wt 138.6 lb

## 2024-07-21 DIAGNOSIS — E876 Hypokalemia: Secondary | ICD-10-CM | POA: Diagnosis not present

## 2024-07-21 DIAGNOSIS — Z0001 Encounter for general adult medical examination with abnormal findings: Secondary | ICD-10-CM | POA: Diagnosis not present

## 2024-07-21 DIAGNOSIS — E113553 Type 2 diabetes mellitus with stable proliferative diabetic retinopathy, bilateral: Secondary | ICD-10-CM | POA: Diagnosis not present

## 2024-07-21 DIAGNOSIS — R001 Bradycardia, unspecified: Secondary | ICD-10-CM

## 2024-07-21 DIAGNOSIS — Z0000 Encounter for general adult medical examination without abnormal findings: Secondary | ICD-10-CM

## 2024-07-21 DIAGNOSIS — E1121 Type 2 diabetes mellitus with diabetic nephropathy: Secondary | ICD-10-CM | POA: Diagnosis not present

## 2024-07-21 DIAGNOSIS — E7800 Pure hypercholesterolemia, unspecified: Secondary | ICD-10-CM

## 2024-07-21 DIAGNOSIS — I10 Essential (primary) hypertension: Secondary | ICD-10-CM | POA: Diagnosis not present

## 2024-07-21 DIAGNOSIS — F02C0 Dementia in other diseases classified elsewhere, severe, without behavioral disturbance, psychotic disturbance, mood disturbance, and anxiety: Secondary | ICD-10-CM

## 2024-07-21 LAB — CBC WITH DIFFERENTIAL/PLATELET
Basophils Absolute: 0 K/uL (ref 0.0–0.1)
Basophils Relative: 0.9 % (ref 0.0–3.0)
Eosinophils Absolute: 0.2 K/uL (ref 0.0–0.7)
Eosinophils Relative: 3.4 % (ref 0.0–5.0)
HCT: 36.2 % (ref 36.0–46.0)
Hemoglobin: 12.2 g/dL (ref 12.0–15.0)
Lymphocytes Relative: 38.4 % (ref 12.0–46.0)
Lymphs Abs: 1.8 K/uL (ref 0.7–4.0)
MCHC: 33.6 g/dL (ref 30.0–36.0)
MCV: 93.7 fl (ref 78.0–100.0)
Monocytes Absolute: 0.5 K/uL (ref 0.1–1.0)
Monocytes Relative: 10 % (ref 3.0–12.0)
Neutro Abs: 2.2 K/uL (ref 1.4–7.7)
Neutrophils Relative %: 47.3 % (ref 43.0–77.0)
Platelets: 299 K/uL (ref 150.0–400.0)
RBC: 3.87 Mil/uL (ref 3.87–5.11)
RDW: 14.4 % (ref 11.5–15.5)
WBC: 4.6 K/uL (ref 4.0–10.5)

## 2024-07-21 LAB — LIPID PANEL
Cholesterol: 199 mg/dL (ref 0–200)
HDL: 75.7 mg/dL (ref >39.00)
LDL Cholesterol: 103 mg/dL — ABNORMAL HIGH (ref 0–99)
NonHDL: 123.19
Total CHOL/HDL Ratio: 3
Triglycerides: 100 mg/dL (ref 0.0–149.0)
VLDL: 20 mg/dL (ref 0.0–40.0)

## 2024-07-21 LAB — BASIC METABOLIC PANEL WITH GFR
BUN: 10 mg/dL (ref 6–23)
CO2: 29 meq/L (ref 19–32)
Calcium: 9.7 mg/dL (ref 8.4–10.5)
Chloride: 103 meq/L (ref 96–112)
Creatinine, Ser: 1.23 mg/dL — ABNORMAL HIGH (ref 0.40–1.20)
GFR: 39.27 mL/min — ABNORMAL LOW (ref >60.00)
Glucose, Bld: 106 mg/dL — ABNORMAL HIGH (ref 70–99)
Potassium: 3.4 meq/L — ABNORMAL LOW (ref 3.5–5.1)
Sodium: 142 meq/L (ref 135–145)

## 2024-07-21 LAB — TSH: TSH: 2 u[IU]/mL (ref 0.35–5.50)

## 2024-07-21 LAB — HEPATIC FUNCTION PANEL
ALT: 14 U/L (ref 0–35)
AST: 24 U/L (ref 0–37)
Albumin: 4.2 g/dL (ref 3.5–5.2)
Alkaline Phosphatase: 75 U/L (ref 39–117)
Bilirubin, Direct: 0.1 mg/dL (ref 0.0–0.3)
Total Bilirubin: 0.6 mg/dL (ref 0.2–1.2)
Total Protein: 8.2 g/dL (ref 6.0–8.3)

## 2024-07-21 LAB — HEMOGLOBIN A1C: Hgb A1c MFr Bld: 5.8 % (ref 4.6–6.5)

## 2024-07-21 MED ORDER — POTASSIUM CHLORIDE ER 10 MEQ PO TBCR: 10.0000 meq | EXTENDED_RELEASE_TABLET | Freq: Two times a day (BID) | ORAL | 0 refills | Status: DC

## 2024-07-21 NOTE — Progress Notes (Signed)
 "  Subjective:  Patient ID: Leah Stafford, female    DOB: 04/23/1934  Age: 89 y.o. MRN: [id number]  CC: Annual Exam, Hypertension, Hyperlipidemia, and Diabetes   HPI [name] presents for a CPX and f/up ----  Discussed the use of AI scribe software for clinical note transcription with the patient, who gave verbal consent to proceed.  History of Present Illness Leah Stafford is an 89 year old female who presents for a routine follow-up visit.  She remains as active as possible but has reduced her walking due to the hot summer. She plans to use her walker more frequently.  She experiences occasional sensations of food getting stuck in her chest when eating quickly, but denies any choking episodes. She attributes this to eating too fast when overly hungry. Her eating habits include consuming small amounts at a time, preferring to 'nibble' on food. Her diet consists of applesauce, baked chicken, raw chicken, fish, and seafood, while she avoids pork.  No chest pain, shortness of breath, dizziness, lightheadedness, or irregular heartbeats. She notes slight swelling in her feet, which she describes as not significant.  No issues with bowel movements and no coughing up blood or phlegm.     Outpatient Medications Prior to Visit  Medication Sig Dispense Refill   acetaminophen  (TYLENOL ) 500 MG tablet Take 2 tablets (1,000 mg total) every 8 (eight) hours as needed by mouth for moderate pain. 30 tablet 0   ammonium lactate  (LAC-HYDRIN ) 12 % lotion Apply 1 Application topically as needed for dry skin. 225 g 0   Cholecalciferol (VITAMIN D PO) Take 5,000 Units by mouth daily. OTC, strength not known      conjugated estrogens (PREMARIN) vaginal cream Place pea-sized amount of cream to the vagina every night for 2 weeks and then 2 times a week thereafter.     Elastic Bandages & Supports (BACK SUPPORT) MISC as needed (pain).     hydrALAZINE  (APRESOLINE ) 100 MG tablet Take 1 tablet (100 mg  total) by mouth 3 (three) times daily. 270 tablet 3   Incontinence Supply Disposable (ASSURANCE UNDERWEAR WOMENS) MISC daily.     Multiple Vitamin (MULTIVITAMIN WITH MINERALS) TABS tablet Take 1 tablet by mouth daily.     rosuvastatin  (CRESTOR ) 5 MG tablet Take 1 tablet (5 mg total) by mouth daily. 90 tablet 3   No facility-administered medications prior to visit.    ROS Review of Systems  Constitutional: Negative.  Negative for appetite change, chills, diaphoresis, fatigue and fever.  HENT:  Positive for hearing loss. Negative for congestion, rhinorrhea, sinus pressure, sinus pain, sore throat and trouble swallowing.   Eyes:  Positive for visual disturbance.  Respiratory: Negative.  Negative for cough, chest tightness, shortness of breath and wheezing.   Cardiovascular:  Negative for chest pain, palpitations and leg swelling.  Gastrointestinal: Negative.  Negative for abdominal pain, constipation, diarrhea, nausea and vomiting.  Endocrine: Negative.   Genitourinary: Negative.  Negative for difficulty urinating.  Musculoskeletal: Negative.   Skin: Negative.   Neurological: Negative.  Negative for dizziness and weakness.  Hematological:  Negative for adenopathy. Does not bruise/bleed easily.  Psychiatric/Behavioral:  Positive for confusion and decreased concentration.     Objective:  BP (!) 146/86 (BP Location: Left Arm, Patient Position: Sitting, Cuff Size: Small)   Pulse 68   Temp 98.4 F (36.9 C) (Oral)   Resp 16   Ht 5' 3.5 (1.613 m)   Wt 138 lb 9.6 oz (62.9 kg)   SpO2 97%   BMI  24.17 kg/m   BP Readings from Last 3 Encounters:  07/21/24 (!) 146/86  07/21/24 (!) 158/72  11/28/23 134/62    Wt Readings from Last 3 Encounters:  07/21/24 138 lb 9.6 oz (62.9 kg)  07/21/24 138 lb 9.6 oz (62.9 kg)  10/30/23 143 lb (64.9 kg)    Physical Exam Vitals reviewed.  Constitutional:      Appearance: She is not ill-appearing.  HENT:     Nose: Nose normal.     Mouth/Throat:      Mouth: Mucous membranes are moist.  Eyes:     General: No scleral icterus.    Conjunctiva/sclera: Conjunctivae normal.  Cardiovascular:     Rate and Rhythm: Normal rate. Rhythm irregular.     Heart sounds: No murmur heard.    No friction rub. No gallop.     Comments: EKG-- SR with PAC's, 69 bpm LAD Anterior infarct pattern Unchanged  Pulmonary:     Effort: Pulmonary effort is normal.     Breath sounds: No stridor. No wheezing, rhonchi or rales.  Abdominal:     General: Abdomen is flat.     Palpations: There is no mass.     Tenderness: There is no abdominal tenderness. There is no guarding.     Hernia: No hernia is present.  Musculoskeletal:        General: No swelling.     Cervical back: Neck supple.     Right lower leg: No edema.     Left lower leg: No edema.  Skin:    General: Skin is warm and dry.     Findings: No rash.  Neurological:     General: No focal deficit present.     Mental Status: She is alert. Mental status is at baseline.  Psychiatric:        Attention and Perception: She is inattentive.        Mood and Affect: Mood normal.        Speech: Speech is delayed.        Behavior: Behavior normal.        Thought Content: Thought content normal.        Cognition and Memory: Cognition is impaired. Memory is impaired.     Lab Results  Component Value Date   WBC 4.6 07/21/2024   HGB 12.2 07/21/2024   HCT 36.2 07/21/2024   PLT 299.0 07/21/2024   GLUCOSE 106 (H) 07/21/2024   CHOL 199 07/21/2024   TRIG 100.0 07/21/2024   HDL 75.70 07/21/2024   LDLDIRECT 100 (H) 09/26/2022   LDLCALC 103 (H) 07/21/2024   ALT 14 07/21/2024   AST 24 07/21/2024   NA 142 07/21/2024   K 3.4 (L) 07/21/2024   CL 103 07/21/2024   CREATININE 1.23 (H) 07/21/2024   BUN 10 07/21/2024   CO2 29 07/21/2024   TSH 2.00 07/21/2024   HGBA1C 5.8 07/21/2024    No results found.  Assessment & Plan:  Primary hypertension- BP is adequately well controlled. 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 -     Hemoglobin A1c; Future -     HM Diabetes Foot Exam -     Ambulatory referral to Ophthalmology  Sinus bradycardia- HR is normal on the EKG. -  TSH; Future  Encounter for general adult medical examination with abnormal findings- Exam completed, labs reviewed, vaccines reviewed and updated, cancer screenings addressed, pt ed material was given.   Pure hypercholesterolemia -     Lipid panel; Future -     Hepatic function panel; Future  Hypokalemia, inadequate intake -     Potassium Chloride  ER; Take 1 tablet (10 mEq total) by mouth 2 (two) times daily.  Dispense: 180 tablet; Refill: 0     Follow-up: Return in about 6 months (around 01/21/2025).  Debby Molt, MD "

## 2024-07-21 NOTE — Progress Notes (Signed)
 Subjective:   Leah Stafford is a 89 y.o. who presents for a Medicare Wellness preventive visit.  As a reminder, Annual Wellness Visits don't include a physical exam, and some assessments may be limited, especially if this visit is performed virtually. We may recommend an in-person follow-up visit with your provider if needed.  Visit Complete: In person  Persons Participating in Visit: Patient.  AWV Questionnaire: No: Patient Medicare AWV questionnaire was not completed prior to this visit.  Cardiac Risk Factors include: advanced age (>35men, >46 women);dyslipidemia;hypertension     Objective:    Today's Vitals   07/21/24 1430 07/21/24 1454  BP: (!) 158/78 (!) 158/72  Pulse: 68   SpO2: 97%   Weight: 138 lb 9.6 oz (62.9 kg)   Height: 5' 3.5 (1.613 m)    Body mass index is 24.17 kg/m.     07/21/2024    2:39 PM 11/23/2021    2:01 PM 04/13/2020    9:27 AM 03/02/2019    1:07 PM  Advanced Directives  Does Patient Have a Medical Advance Directive? Yes Yes Yes No  Type of Estate agent of [place];Living will Healthcare Power of [place];Living will Healthcare Power of Attorney   Does patient want to make changes to medical advance directive?  Yes (MAU/Ambulatory/Procedural Areas - Information given) No - Patient declined Yes (ED - Information included in AVS)   Copy of Healthcare Power of Attorney in Chart? No - copy requested  No - copy requested      Data saved with a previous flowsheet row definition    Current Medications (verified) Outpatient Encounter Medications as of 07/21/2024  Medication Sig   acetaminophen  (TYLENOL ) 500 MG tablet Take 2 tablets (1,000 mg total) every 8 (eight) hours as needed by mouth for moderate pain.   ammonium lactate  (LAC-HYDRIN ) 12 % lotion Apply 1 Application topically as needed for dry skin.   Cholecalciferol (VITAMIN D PO) Take 5,000 Units by mouth daily. OTC, strength not known    conjugated estrogens (PREMARIN)  vaginal cream Place pea-sized amount of cream to the vagina every night for 2 weeks and then 2 times a week thereafter.   Elastic Bandages & Supports (BACK SUPPORT) MISC as needed (pain).   hydrALAZINE  (APRESOLINE ) 100 MG tablet Take 1 tablet (100 mg total) by mouth 3 (three) times daily.   Incontinence Supply Disposable (ASSURANCE UNDERWEAR WOMENS) MISC daily.   Multiple Vitamin (MULTIVITAMIN WITH MINERALS) TABS tablet Take 1 tablet by mouth daily.   rosuvastatin  (CRESTOR ) 5 MG tablet Take 1 tablet (5 mg total) by mouth daily.   [DISCONTINUED] DUREZOL 0.05 % EMUL Inject 1 drop into the eye daily.   No facility-administered encounter medications on file as of 07/21/2024.    Allergies (verified) Codeine   History: Past Medical History:  Diagnosis Date   Arthritis    Cataract    Depression    Diabetes mellitus without complication (HCC)    Diet and excercise   Diabetic retinopathy (HCC)    High cholesterol    Hypertension    Past Surgical History:  Procedure Laterality Date   EYE SURGERY     TUBAL LIGATION     Family History  Problem Relation Age of Onset   Cancer Father    Hyperlipidemia Sister    Hypertension Sister    Hypertension Brother    Hyperlipidemia Brother    Heart disease Brother    Social History   Socioeconomic History   Marital status: Widowed  Spouse name: Not on file   Number of children: 3   Years of education: Not on file   Highest education level: Not on file  Occupational History   Occupation: retired  Tobacco Use   Smoking status: Never   Smokeless tobacco: Former    Types: Snuff  Vaping Use   Vaping status: Never Used  Substance and Sexual Activity   Alcohol use: No   Drug use: No   Sexual activity: Not Currently  Other Topics Concern   Not on file  Social History Narrative   Marital status: widowed; not dating in 2018      Children: 3 children; 9 grandchildren; 8 gg.      Lives: alone; daughter five minutes per day.       Employment: retired.       Tobacco: none      Alcohol: none      Exercise: previous walking; no walking during winter months.      ADLs: no driving.  Cleans, washes clothes; cleans house; pays bills.  Grocery shopping with daughter.  No assistant devices in 2018.       Advanced Directives:  None;  FULL CODE; daughter HCPOA.         Social Drivers of Corporate investment banker Strain: Low Risk  (07/21/2024)   Overall Financial Resource Strain (CARDIA)    Difficulty of Paying Living Expenses: Not hard at all  Food Insecurity: No Food Insecurity (07/21/2024)   Hunger Vital Sign    Worried About Running Out of Food in the Last Year: Never true    Ran Out of Food in the Last Year: Never true  Transportation Needs: No Transportation Needs (07/21/2024)   PRAPARE - Administrator, Civil Service (Medical): No    Lack of Transportation (Non-Medical): No  Physical Activity: Insufficiently Active (07/21/2024)   Exercise Vital Sign    Days of Exercise per Week: 7 days    Minutes of Exercise per Session: 20 min  Stress: No Stress Concern Present (07/21/2024)   [employer] of Occupational Health - Occupational Stress Questionnaire    Feeling of Stress: Not at all  Social Connections: Moderately Integrated (07/21/2024)   Social Connection and Isolation Panel    Frequency of Communication with Friends and Family: More than three times a week    Frequency of Social Gatherings with Friends and Family: More than three times a week    Attends Religious Services: More than 4 times per year    Active Member of [employer] or Organizations: Yes    Attends Banker Meetings: More than 4 times per year    Marital Status: Widowed    Tobacco Counseling Counseling given: No    Clinical Intake:  Pre-visit preparation completed: Yes  Pain : No/denies pain     BMI - recorded: 24.17 Nutritional Status: BMI of 19-24  Normal Nutritional Risks: None Diabetes: Yes CBG done?:  No Did pt. bring in CBG monitor from home?: No  Lab Results  Component Value Date   HGBA1C 5.5 01/29/2023   HGBA1C 5.9 11/21/2021   HGBA1C 5.9 (A) 01/13/2020     How often do you need to have someone help you when you read instructions, pamphlets, or other written materials from your doctor or pharmacy?: 1 - Never  Interpreter Needed?: No  Information entered by :: Leah Stafford, CMA   Activities of Daily Living     07/21/2024    2:32 PM  In  your present state of health, do you have any difficulty performing the following activities:  Hearing? 0  Vision? 0  Difficulty concentrating or making decisions? 0  Walking or climbing stairs? 0  Dressing or bathing? 0  Doing errands, shopping? 0  Preparing Food and eating ? N  Using the Toilet? N  In the past six months, have you accidently leaked urine? Y  Comment wears pantyliner/pad  Do you have problems with loss of bowel control? N  Managing your Medications? N  Managing your Finances? N  Housekeeping or managing your Housekeeping? N    Patient Care Team: Joshua Debby CROME, MD as PCP - General (Internal Medicine) Santo Stanly LABOR, MD as PCP - Cardiology (Cardiology) Loreda Hacker, DPM as Consulting Physician (Podiatry)  I have updated your Care Teams any recent Medical Services you may have received from other providers in the past year.     Assessment:   This is a routine wellness examination for Leah Stafford.  Hearing/Vision screen Hearing Screening - Comments:: Has hearing aids Vision Screening - Comments:: Wears rx glasses - appt w/Ophthalmologist in 07/2024   Goals Addressed               This Visit's Progress     Patient Stated (pt-stated)        Patient stated she plans to continue travel and take medications       Depression Screen     07/21/2024    2:34 PM 01/29/2023    1:38 PM 11/23/2021    2:06 PM 04/13/2020    9:30 AM 01/13/2020    1:22 PM 07/15/2019    1:24 PM 03/23/2019    2:30 PM  PHQ 2/9  Scores  PHQ - 2 Score 0 0 0 0 0 0 0  PHQ- 9 Score 0          Fall Risk     07/21/2024    2:33 PM 01/29/2023    1:38 PM 11/23/2021    2:04 PM 11/21/2021    2:51 PM 04/13/2020    9:29 AM  Fall Risk   Falls in the past year? 0 0 0 0 0  Number falls in past yr: 0 0 0  0  Injury with Fall? 0 0 0  0  Risk for fall due to : No Fall Risks No Fall Risks No Fall Risks    Follow up Falls evaluation completed;Falls prevention discussed Falls evaluation completed Falls prevention discussed   Falls evaluation completed      Data saved with a previous flowsheet row definition    MEDICARE RISK AT HOME:  Medicare Risk at Home Any stairs in or around the home?: Yes (outside) If so, are there any without handrails?: No Home free of loose throw rugs in walkways, pet beds, electrical cords, etc?: Yes Adequate lighting in your home to reduce risk of falls?: Yes Life alert?: No Use of a cane, walker or w/c?: No Grab bars in the bathroom?: Yes Shower chair or bench in shower?: No Elevated toilet seat or a handicapped toilet?: Yes  TIMED UP AND GO:  Was the test performed?  No  Cognitive Function: 6CIT completed        07/21/2024    2:37 PM 04/13/2020    9:27 AM 03/02/2019    1:09 PM  6CIT Screen  What Year? 0 points 0 points 0 points  What month? 0 points 0 points 0 points  What time? 0 points 0 points 0 points  Count back from 20 0 points 0 points 0 points  Months in reverse 0 points 0 points 0 points  Repeat phrase 2 points 0 points 0 points  Total Score 2 points 0 points 0 points    Immunizations Immunization History  Administered Date(s) Administered   Pneumococcal Conjugate-13 11/17/2014   Pneumococcal Polysaccharide-23 09/18/2013, 11/21/2021    Screening Tests Health Maintenance  Topic Date Due   Zoster Vaccines- Shingrix (1 of 2) Never done   HEMOGLOBIN A1C  08/01/2023   FOOT EXAM  09/18/2023   INFLUENZA VACCINE  06/26/2024   OPHTHALMOLOGY EXAM  07/09/2024   DEXA SCAN   10/21/2024 (Originally 02/28/2001)   Medicare Annual Wellness (AWV)  07/21/2025   Pneumococcal Vaccine: 50+ Years  Completed   HPV VACCINES  Aged Out   Meningococcal B Vaccine  Aged Out   DTaP/Tdap/Td  Discontinued   COVID-19 Vaccine  Discontinued    Health Maintenance  Health Maintenance Due  Topic Date Due   Zoster Vaccines- Shingrix (1 of 2) Never done   HEMOGLOBIN A1C  08/01/2023   FOOT EXAM  09/18/2023   INFLUENZA VACCINE  06/26/2024   OPHTHALMOLOGY EXAM  07/09/2024   Health Maintenance Items Addressed:  Labs Ordered: Hemoglobin A1C  I have recommended that this patient have a immunization for Shingles but she declines at this time. I have discussed the risks and benefits of this procedure with her. The patient verbalizes understanding.   Foot Exam status: Appt at [facility] in 07/2024   Additional Screening:  Vision Screening: Recommended annual ophthalmology exams for early detection of glaucoma and other disorders of the eye. Would you like a referral to an eye doctor? No  Patient has an appt w/an Ophthalmologist in 07/2024 for a Diabetic eye exam.  Dental Screening: Recommended annual dental exams for proper oral hygiene  Community Resource Referral / Chronic Care Management: CRR required this visit?  No   CCM required this visit?  No   Plan:    I have personally reviewed and noted the following in the patient's chart:   Medical and social history Use of alcohol, tobacco or illicit drugs  Current medications and supplements including opioid prescriptions. Patient is not currently taking opioid prescriptions. Functional ability and status Nutritional status Physical activity Advanced directives List of other physicians Hospitalizations, surgeries, and ER visits in previous 12 months Vitals Screenings to include cognitive, depression, and falls Referrals and appointments  In addition, I have reviewed and discussed with patient certain preventive  protocols, quality metrics, and best practice recommendations. A written personalized care plan for preventive services as well as general preventive health recommendations were provided to patient.   Leah Stafford, CMA   07/21/2024   After Visit Summary: (In Person-Declined) Patient declined AVS at this time.  Notes: Nothing significant to report at this time.

## 2024-07-21 NOTE — Patient Instructions (Signed)

## 2024-07-21 NOTE — Patient Instructions (Addendum)
 Leah Stafford , Thank you for taking time out of your busy schedule to complete your Annual Wellness Visit with me. I enjoyed our conversation and look forward to speaking with you again next year. I, as well as your care team,  appreciate your ongoing commitment to your health goals. Please review the following plan we discussed and let me know if I can assist you in the future. Your Game plan/ To Do List    Referrals: If you haven't heard from the office you've been referred to, please reach out to them at the phone provided.   Follow up Visits: We will see or speak with you next year for your Next Medicare AWV with our clinical staff Have you seen your provider in the last 6 months (3 months if uncontrolled diabetes)? No  Clinician Recommendations:  Aim for 30 minutes of exercise or brisk walking, 6-8 glasses of water, and 5 servings of fruits and vegetables each day.       This is a list of the screenings recommended for you:  Health Maintenance  Topic Date Due   Zoster (Shingles) Vaccine (1 of 2) Never done   Hemoglobin A1C  08/01/2023   Complete foot exam   09/18/2023   Flu Shot  06/26/2024   Eye exam for diabetics  07/09/2024   DEXA scan (bone density measurement)  10/21/2024*   Medicare Annual Wellness Visit  07/21/2025   Pneumococcal Vaccine for age over 13  Completed   HPV Vaccine  Aged Out   Meningitis B Vaccine  Aged Out   DTaP/Tdap/Td vaccine  Discontinued   COVID-19 Vaccine  Discontinued  *Topic was postponed. The date shown is not the original due date.    Advanced directives: (Copy Requested) Please bring a copy of your health care power of attorney and living will to the office to be added to your chart at your convenience. You can mail to [facility] [address] [place], [place] [address] or email to [email or web address] Advance Care Planning is important because it:  [x]  Makes sure you receive the medical care that is consistent with your  values, goals, and preferences  [x]  It provides guidance to your family and loved ones and reduces their decisional burden about whether or not they are making the right decisions based on your wishes.  Follow the link provided in your after visit summary or read over the paperwork we have mailed to you to help you started getting your Advance Directives in place. If you need assistance in completing these, please reach out to us  so that we can help you!

## 2024-07-22 ENCOUNTER — Ambulatory Visit: Payer: Self-pay | Admitting: Internal Medicine

## 2024-08-04 ENCOUNTER — Ambulatory Visit: Admitting: Podiatry

## 2024-08-04 ENCOUNTER — Telehealth: Payer: Self-pay | Admitting: Radiology

## 2024-08-04 NOTE — Telephone Encounter (Signed)
 Copied from CRM [id number]. Topic: Clinical - Medication Question >> Aug 04, 2024  2:23 PM Mercedes MATSU wrote: Reason for CRM: Patient called in stating that she is concerned about the new medication she was just prescribed potassium chloride  (KLOR-CON  10) 10 MEQ tablet and she has some questions about it. She said that she would like a call back [phone].

## 2024-08-06 ENCOUNTER — Telehealth: Payer: Self-pay | Admitting: Radiology

## 2024-08-06 NOTE — Telephone Encounter (Signed)
 Copied from CRM #[id number]. Topic: Clinical - Medication Question >> Aug 06, 2024  3:53 PM Berneda F wrote: Reason for CRM: Pt states her labs came back with low potassium and she was prescribed a potassium supplement and would like to know more information about it please. She called on 9/9 but never got a callback.  Please call patient back at [phone]

## 2024-08-07 NOTE — Telephone Encounter (Signed)
 Unable to reach patient. LMTRC

## 2024-08-07 NOTE — Telephone Encounter (Signed)
 This is being handled in another telephone call. This is a duplicate message. Will be closing this encounter

## 2024-08-11 ENCOUNTER — Telehealth: Payer: Self-pay

## 2024-08-11 NOTE — Telephone Encounter (Signed)
 Patient has been made aware that her medication has been sent to her mail order pharmacy and she can reach out to them to see where it is. She gave a verbal understanding.

## 2024-08-11 NOTE — Telephone Encounter (Signed)
 Copied from CRM #[id number]. Topic: Clinical - Medication Question >> Aug 06, 2024  3:53 PM Berneda F wrote: Reason for CRM: Pt states her labs came back with low potassium and she was prescribed a potassium supplement and would like to know more information about it please. She called on 9/9 but never got a callback.  Please call patient back at [phone] >> Aug 11, 2024  2:21 PM Tysheama G wrote: Patient is calling again for the potassium medication that Dr.Jones prescribed her, she stated she doesn't have the name of the medication but she would like more info about it. Callback number [phone]

## 2024-08-13 ENCOUNTER — Ambulatory Visit: Payer: Self-pay

## 2024-08-13 ENCOUNTER — Telehealth: Payer: Self-pay | Admitting: Radiology

## 2024-08-13 NOTE — Telephone Encounter (Signed)
 Unable to reach patient. LMTRC

## 2024-08-13 NOTE — Telephone Encounter (Signed)
 Reason for Disposition  General information question, no triage required and triager able to answer question  Answer Assessment - Initial Assessment Questions Answer Assessment - Initial Assessment Questions Patient called to report she had not received potassium from [facility]. This RN contacted [facility] and was told that they never received the order. Message routed to provider to request it be re-sent.  This RN contacted patient to make her aware that the request to have it be re-sent. Requesting it notated to [facility] that it is requested mail order to her address: [address]  [place] [address]  Protocols used: Information Only Call - No Triage-A-AH Copied from CRM [id number]. Topic: Clinical - Prescription Issue >> Aug 13, 2024  4:02 PM Martinique E wrote: Reason for CRM: Patient calling in stating that she still has not received her potassium chloride  (KLOR-CON  10) 10 MEQ tablet that was ordered on 8/26 through [facility] home delivery. Patient would appreciate a call back to clarify this issue.

## 2024-08-13 NOTE — Telephone Encounter (Signed)
 Copied from CRM [id number]. Topic: General - Call Back - No Documentation >> Aug 13, 2024 12:23 PM Shereese L wrote: Reason for RMF:ejupzwu called in returning office call from [facility]. Adv that she's on lunch and will return call after

## 2024-08-14 ENCOUNTER — Other Ambulatory Visit: Payer: Self-pay

## 2024-08-14 ENCOUNTER — Other Ambulatory Visit (HOSPITAL_COMMUNITY): Payer: Self-pay

## 2024-08-14 DIAGNOSIS — E876 Hypokalemia: Secondary | ICD-10-CM

## 2024-08-14 MED ORDER — POTASSIUM CHLORIDE ER 10 MEQ PO TBCR
10.0000 meq | EXTENDED_RELEASE_TABLET | Freq: Two times a day (BID) | ORAL | 0 refills | Status: DC
  Filled 2024-08-14 – 2024-08-18 (×2): qty 180, 90d supply, fill #0

## 2024-08-14 NOTE — Telephone Encounter (Signed)
 This has been handled.

## 2024-08-14 NOTE — Telephone Encounter (Signed)
 Spoke with patient. Resent her medication. Patient has been made aware.

## 2024-08-14 NOTE — Telephone Encounter (Signed)
 Medication has been resent to the pharmacy of the patients choice.

## 2024-08-18 ENCOUNTER — Other Ambulatory Visit (HOSPITAL_COMMUNITY): Payer: Self-pay

## 2024-08-18 ENCOUNTER — Ambulatory Visit: Admitting: Podiatry

## 2024-08-18 ENCOUNTER — Other Ambulatory Visit: Payer: Self-pay

## 2024-08-30 ENCOUNTER — Other Ambulatory Visit: Payer: Self-pay

## 2024-08-30 ENCOUNTER — Emergency Department (HOSPITAL_COMMUNITY)

## 2024-08-30 ENCOUNTER — Emergency Department (HOSPITAL_COMMUNITY)
Admission: EM | Admit: 2024-08-30 | Discharge: 2024-08-30 | Disposition: A | Discharge status: Home / Self Care | Attending: Emergency Medicine | Admitting: Emergency Medicine

## 2024-08-30 DIAGNOSIS — M25551 Pain in right hip: Secondary | ICD-10-CM | POA: Insufficient documentation

## 2024-08-30 DIAGNOSIS — I10 Essential (primary) hypertension: Secondary | ICD-10-CM | POA: Insufficient documentation

## 2024-08-30 DIAGNOSIS — M19072 Primary osteoarthritis, left ankle and foot: Secondary | ICD-10-CM | POA: Diagnosis not present

## 2024-08-30 DIAGNOSIS — M25562 Pain in left knee: Secondary | ICD-10-CM | POA: Diagnosis not present

## 2024-08-30 DIAGNOSIS — M1712 Unilateral primary osteoarthritis, left knee: Secondary | ICD-10-CM | POA: Insufficient documentation

## 2024-08-30 DIAGNOSIS — M2012 Hallux valgus (acquired), left foot: Secondary | ICD-10-CM | POA: Diagnosis not present

## 2024-08-30 DIAGNOSIS — M25462 Effusion, left knee: Secondary | ICD-10-CM | POA: Diagnosis not present

## 2024-08-30 DIAGNOSIS — M7732 Calcaneal spur, left foot: Secondary | ICD-10-CM | POA: Diagnosis not present

## 2024-08-30 DIAGNOSIS — M160 Bilateral primary osteoarthritis of hip: Secondary | ICD-10-CM | POA: Diagnosis not present

## 2024-08-30 DIAGNOSIS — M549 Dorsalgia, unspecified: Secondary | ICD-10-CM | POA: Diagnosis not present

## 2024-08-30 DIAGNOSIS — M79672 Pain in left foot: Secondary | ICD-10-CM | POA: Insufficient documentation

## 2024-08-30 DIAGNOSIS — M7662 Achilles tendinitis, left leg: Secondary | ICD-10-CM | POA: Diagnosis not present

## 2024-08-30 DIAGNOSIS — S82142A Displaced bicondylar fracture of left tibia, initial encounter for closed fracture: Secondary | ICD-10-CM | POA: Diagnosis not present

## 2024-08-30 DIAGNOSIS — M1990 Unspecified osteoarthritis, unspecified site: Secondary | ICD-10-CM

## 2024-08-30 DIAGNOSIS — M48061 Spinal stenosis, lumbar region without neurogenic claudication: Secondary | ICD-10-CM | POA: Diagnosis not present

## 2024-08-30 DIAGNOSIS — M51369 Other intervertebral disc degeneration, lumbar region without mention of lumbar back pain or lower extremity pain: Secondary | ICD-10-CM | POA: Diagnosis not present

## 2024-08-30 LAB — MAGNESIUM: Magnesium: 2.1 mg/dL (ref 1.7–2.4)

## 2024-08-30 LAB — CBC WITH DIFFERENTIAL/PLATELET
Abs Immature Granulocytes: 0.05 K/uL (ref 0.00–0.07)
Basophils Absolute: 0 K/uL (ref 0.0–0.1)
Basophils Relative: 0 %
Eosinophils Absolute: 0 K/uL (ref 0.0–0.5)
Eosinophils Relative: 0 %
HCT: 32.3 % — ABNORMAL LOW (ref 36.0–46.0)
Hemoglobin: 10.8 g/dL — ABNORMAL LOW (ref 12.0–15.0)
Immature Granulocytes: 1 %
Lymphocytes Relative: 8 %
Lymphs Abs: 0.8 K/uL (ref 0.7–4.0)
MCH: 30.8 pg (ref 26.0–34.0)
MCHC: 33.4 g/dL (ref 30.0–36.0)
MCV: 92 fL (ref 80.0–100.0)
Monocytes Absolute: 1 K/uL (ref 0.1–1.0)
Monocytes Relative: 10 %
Neutro Abs: 8.6 K/uL — ABNORMAL HIGH (ref 1.7–7.7)
Neutrophils Relative %: 81 %
Platelets: 338 K/uL (ref 150–400)
RBC: 3.51 MIL/uL — ABNORMAL LOW (ref 3.87–5.11)
RDW: 13.7 % (ref 11.5–15.5)
WBC: 10.5 K/uL (ref 4.0–10.5)
nRBC: 0 % (ref 0.0–0.2)

## 2024-08-30 LAB — BASIC METABOLIC PANEL WITH GFR
Anion gap: 16 — ABNORMAL HIGH (ref 5–15)
BUN: 19 mg/dL (ref 8–23)
CO2: 20 mmol/L — ABNORMAL LOW (ref 22–32)
Calcium: 10.3 mg/dL (ref 8.9–10.3)
Chloride: 99 mmol/L (ref 98–111)
Creatinine, Ser: 1.21 mg/dL — ABNORMAL HIGH (ref 0.44–1.00)
GFR, Estimated: 43 mL/min — ABNORMAL LOW (ref >60)
Glucose, Bld: 142 mg/dL — ABNORMAL HIGH (ref 70–99)
Potassium: 4.2 mmol/L (ref 3.5–5.1)
Sodium: 135 mmol/L (ref 135–145)

## 2024-08-30 LAB — I-STAT CHEM 8, ED
BUN: 17 mg/dL (ref 8–23)
Calcium, Ion: 1.22 mmol/L (ref 1.15–1.40)
Chloride: 103 mmol/L (ref 98–111)
Creatinine, Ser: 1.4 mg/dL — ABNORMAL HIGH (ref 0.44–1.00)
Glucose, Bld: 140 mg/dL — ABNORMAL HIGH (ref 70–99)
HCT: 28 % — ABNORMAL LOW (ref 36.0–46.0)
Hemoglobin: 9.5 g/dL — ABNORMAL LOW (ref 12.0–15.0)
Potassium: 4.1 mmol/L (ref 3.5–5.1)
Sodium: 136 mmol/L (ref 135–145)
TCO2: 20 mmol/L — ABNORMAL LOW (ref 22–32)

## 2024-08-30 MED ORDER — ONDANSETRON HCL 4 MG/2ML IJ SOLN
4.0000 mg | Freq: Once | INTRAMUSCULAR | Status: AC
  Administered 2024-08-30: 4 mg via INTRAVENOUS
  Filled 2024-08-30: qty 2

## 2024-08-30 MED ORDER — TRAMADOL HCL 50 MG PO TABS: 50.0000 mg | ORAL_TABLET | Freq: Four times a day (QID) | ORAL | 0 refills | Status: DC | PRN

## 2024-08-30 MED ORDER — MORPHINE SULFATE (PF) 4 MG/ML IV SOLN
4.0000 mg | Freq: Once | INTRAVENOUS | Status: AC
  Administered 2024-08-30: 4 mg via INTRAVENOUS
  Filled 2024-08-30: qty 1

## 2024-08-30 NOTE — ED Triage Notes (Signed)
 Patient c/o left hip pain x 3 days. Patient report taking OTC PRN medication without relief. Patient denies any fall or previous injury. Patient denies CP and SOB.

## 2024-08-30 NOTE — ED Provider Notes (Signed)
 [place] EMERGENCY DEPARTMENT AT [facility] Provider Note   CSN: [id number] Arrival date & time: 08/30/24  8065     Patient presents with: Hip Pain   Leah Stafford is a 89 y.o. female history of arthritis, hypertension here presenting with right hip pain and left knee pain and left foot pain.  Patient states that for the last 3 days she has been having right hip pain.  Patient also noticed left knee pain and swelling and left foot pain.  Patient denies any trauma or injury.  Patient has been applying lidocaine  patch with minimal relief.  Patient also has been taking Tylenol  as needed.  Patient is not prescribed any pain medicine.  Patient has history of arthritis   The history is provided by the patient.       Prior to Admission medications   Medication Sig Start Date End Date Taking? Authorizing Provider  acetaminophen  (TYLENOL ) 500 MG tablet Take 2 tablets (1,000 mg total) every 8 (eight) hours as needed by mouth for moderate pain. 10/07/17   Stallings, Zoe A, MD  ammonium lactate  (LAC-HYDRIN ) 12 % lotion Apply 1 Application topically as needed for dry skin. 10/16/23   Loreda Hacker, DPM  Cholecalciferol (VITAMIN D PO) Take 5,000 Units by mouth daily. OTC, strength not known     [provider]  conjugated estrogens (PREMARIN) vaginal cream Place pea-sized amount of cream to the vagina every night for 2 weeks and then 2 times a week thereafter. 09/09/19   [provider]  Elastic Bandages & Supports (BACK SUPPORT) MISC as needed (pain). 11/11/17   [provider]  hydrALAZINE  (APRESOLINE ) 100 MG tablet Take 1 tablet (100 mg total) by mouth 3 (three) times daily. 12/04/23   Santo Stanly LABOR, MD  Incontinence Supply Disposable (ASSURANCE UNDERWEAR WOMENS) MISC daily. 11/11/17   [provider]  Multiple Vitamin (MULTIVITAMIN WITH MINERALS) TABS tablet Take 1 tablet by mouth daily.    [provider]  potassium chloride   (KLOR-CON  10) 10 MEQ tablet Take 1 tablet (10 mEq total) by mouth 2 (two) times daily. 08/14/24   Joshua Debby CROME, MD  rosuvastatin  (CRESTOR ) 5 MG tablet Take 1 tablet (5 mg total) by mouth daily. 10/08/23   Santo Stanly LABOR, MD    Allergies: Codeine    Review of Systems  Musculoskeletal:  Positive for back pain.       Right hip pain left knee pain and left foot pain  All other systems reviewed and are negative.   Updated Vital Signs BP (!) 196/75   Pulse 98   Temp 99.7 F (37.6 C)   Resp 20   SpO2 98%   Physical Exam Vitals and nursing note reviewed.  Constitutional:      Comments: Chronically ill and uncomfortable  HENT:     Head: Normocephalic.     Nose: Nose normal.     Mouth/Throat:     Mouth: Mucous membranes are moist.     Pharynx: Oropharynx is clear.  Eyes:     Extraocular Movements: Extraocular movements intact.     Pupils: Pupils are equal, round, and reactive to light.  Cardiovascular:     Rate and Rhythm: Normal rate and regular rhythm.     Pulses: Normal pulses.     Heart sounds: Normal heart sounds.  Pulmonary:     Effort: Pulmonary effort is normal.     Breath sounds: Normal breath sounds.  Abdominal:     General: Abdomen is flat.  Palpations: Abdomen is soft.  Musculoskeletal:     Cervical back: Normal range of motion.     Comments: Right paralumbar tenderness and right hip tenderness.  Patient also has small left knee effusion and mild tenderness over the left knee.  The left knee does not appear to be red or hot.  Patient does have some tenderness of the left foot but no obvious deformity.  Skin:    Capillary Refill: Capillary refill takes less than 2 seconds.  Neurological:     General: No focal deficit present.     Mental Status: She is alert and oriented to person, place, and time.  Psychiatric:        Mood and Affect: Mood normal.        Behavior: Behavior normal.     (all labs ordered are listed, but only abnormal results are  displayed) Labs Reviewed  CBC WITH DIFFERENTIAL/PLATELET  BASIC METABOLIC PANEL WITH GFR  MAGNESIUM  I-STAT CHEM 8, ED    EKG: None  Radiology: CT Knee Left Wo Contrast Result Date: 08/30/2024 EXAM: CT Left Knee Without IV Contrast 08/30/2024 09:07:35 PM TECHNIQUE: Axial images were acquired through the left knee without IV contrast. Reformatted images were reviewed. Automated exposure control, iterative reconstruction, and/or weight based adjustment of the mA/kV was utilized to reduce the radiation dose to as low as reasonably achievable. COMPARISON: None provided. CLINICAL HISTORY: Knee trauma, tibial plateau fracture. FINDINGS: BONES: No acute fracture or focal osseous lesion. JOINTS: Normal alignment. No dislocation. Mild-to-moderate bicompartmental degenerative arthritis involving the medial and lateral compartments with joint space narrowing. 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 Dystrophic calcification of the posterior cruciate ligament. Dysrhythmic calcification involving the medial collateral ligament adjacent to the medial femoral condyle in keeping with remote MCL injury. Dystrophic calcification involving the origin of the median head of the gastrocnemius along the posterior aspect of the medial femoral condyle. Small knee effusion. Moderate fatty atrophy of the skeletal musculature. Vascular calcifications noted. IMPRESSION: 1. No acute osseous abnormality. 2. 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 3. Dystrophic calcification of the posterior cruciate ligament and medial collateral ligament, consistent with remote MCL and probable PCL injury. 4. Small knee effusion. Electronically signed by: Dorethia Molt MD 08/30/2024 09:48 PM EDT RP Workstation: [id number]     Procedures   Medications Ordered in the ED  morphine (PF) 4 MG/ML  injection 4 mg (has no administration in time range)  ondansetron  (ZOFRAN ) injection 4 mg (has no administration in time range)                                    Medical Decision Making Leah Stafford is a 89 y.o. female here presenting with back and hip pain and left knee pain and leg pain.  She does have arthritis and I suspect that she likely has worsening arthritis causing her pain.  She does have a left knee effusion but no signs of septic joint.  Plan to get basic blood work and x-rays and will give pain medicine and reassess  10:44 PM I reviewed patient's labs and they were unremarkable.  CT of the left knee showed arthritis with minimal effusion.  Patient has arthritis on all her joints but no fractures.  Patient was given morphine for pain and pain has improved.  Patient states that Tylenol  and lidocaine   patch has not been helping her.  Will prescribe tramadol  as needed for pain.  Problems Addressed: Arthritis: acute illness or injury Effusion of left knee: acute illness or injury Hypertension, unspecified type: acute illness or injury  Amount and/or Complexity of Data Reviewed Labs: ordered. Decision-making details documented in ED Course. Radiology: ordered and independent interpretation performed. Decision-making details documented in ED Course.  Risk Prescription drug management.     Final diagnoses:  None    ED Discharge Orders     None          Patt Alm Macho, MD 08/30/24 2246

## 2024-08-30 NOTE — Discharge Instructions (Addendum)
 As we discussed, you have arthritis.   Continue Tylenol  for pain and Ultram  for severe pain  Your blood pressure is elevated and continue taking your blood pressure medicines as prescribed  I have ordered a walker and hospital bed and the social worker will follow-up with you in the morning  See your doctor for follow-up  Return to ER if you have worse leg pain or knee pain or trouble walking

## 2024-08-31 ENCOUNTER — Telehealth: Payer: Self-pay

## 2024-08-31 NOTE — Telephone Encounter (Signed)
 Copied from CRM [id number]. Topic: Clinical - Order For Equipment >> Aug 31, 2024  3:34 PM Rea C wrote: Reason for CRM: Patient's daughter is calling in requesting that patient gets a walker with a seat and a medical bed. They are trying to get therapy for at home. Patient's daughter needs assistance with at home care to assist with lifting patient.   Equipment is covered by the insurance they just need the doctor to sign off on it.   Darold [phone] (daughter) >> Aug 31, 2024  3:41 PM Rea BROCKS wrote: [facility]-[phone] [facility]- [phone] [employer]- [phone]   These pharmacies provide the medical bed and walker with a seat.   Patient was seen at ER last night. Testing was done and she was shown to have arthritis all through her body. And, patients daughter is going to pick up tramadol  for patient.   Patient's daughter would also like to speak with Dr. Joshua about home health aide.

## 2024-09-01 ENCOUNTER — Other Ambulatory Visit: Payer: Self-pay | Admitting: Internal Medicine

## 2024-09-01 DIAGNOSIS — M19012 Primary osteoarthritis, left shoulder: Secondary | ICD-10-CM

## 2024-09-01 DIAGNOSIS — F02C0 Dementia in other diseases classified elsewhere, severe, without behavioral disturbance, psychotic disturbance, mood disturbance, and anxiety: Secondary | ICD-10-CM

## 2024-09-01 DIAGNOSIS — N184 Chronic kidney disease, stage 4 (severe): Secondary | ICD-10-CM

## 2024-09-01 NOTE — Addendum Note (Signed)
 Addended by: JOSHUA DEBBY CROME on: 09/01/2024 10:11 AM   Modules accepted: Orders

## 2024-09-01 NOTE — Telephone Encounter (Signed)
 This patient was last seen in August but I don't see anything about this in the note. DO she need another office visit before we can do this?

## 2024-09-01 NOTE — Telephone Encounter (Signed)
The order is on your desk.

## 2024-09-01 NOTE — Telephone Encounter (Signed)
 Patients daughter needs a referral for home health. A Aid and some pt at home. Can we place this referral.

## 2024-09-02 ENCOUNTER — Encounter: Payer: Self-pay | Admitting: Internal Medicine

## 2024-09-02 ENCOUNTER — Telehealth: Payer: Self-pay

## 2024-09-02 NOTE — Telephone Encounter (Signed)
 I called and spoke with the patients daughter. Advised her that we sent the order to [facility] yesterday and she gave a verbal understanding. I advised her that the bed will be the same size as the ones in the hospital. She gave a verbal understanding to that also. The call dropped after that.

## 2024-09-02 NOTE — Telephone Encounter (Signed)
 Copied from CRM [id number]. Topic: Clinical - Order For Equipment >> Aug 31, 2024  3:34 PM Rea C wrote: Reason for CRM: Patient's daughter is calling in requesting that patient gets a walker with a seat and a medical bed. They are trying to get therapy for at home. Patient's daughter needs assistance with at home care to assist with lifting patient.   Equipment is covered by the insurance they just need the doctor to sign off on it.   Darold [phone] (daughter) >> Sep 02, 2024  2:32 PM Mia F wrote: Pt daughter is calling to see when [facility] will send the equipment needed for her mother. Please call Darold [phone] (daughter) >> Aug 31, 2024  3:41 PM Rea BROCKS wrote: [phone] [place]- [phone] [facility]- [phone]   These pharmacies provide the medical bed and walker with a seat.   Patient was seen at ER last night. Testing was done and she was shown to have arthritis all through her body. And, patients daughter is going to pick up tramadol  for patient.   Patient's daughter would also like to speak with Dr. Joshua about home health aide.

## 2024-09-03 ENCOUNTER — Other Ambulatory Visit: Payer: Self-pay | Admitting: Internal Medicine

## 2024-09-03 ENCOUNTER — Other Ambulatory Visit: Payer: Self-pay

## 2024-09-03 ENCOUNTER — Other Ambulatory Visit (HOSPITAL_COMMUNITY): Payer: Self-pay

## 2024-09-03 ENCOUNTER — Telehealth: Payer: Self-pay

## 2024-09-03 DIAGNOSIS — E876 Hypokalemia: Secondary | ICD-10-CM

## 2024-09-03 NOTE — Telephone Encounter (Signed)
 Copied from CRM [id number]. Topic: Clinical - Order For Equipment >> Aug 31, 2024  3:34 PM Rea C wrote: Reason for CRM: Patient's daughter is calling in requesting that patient gets a walker with a seat and a medical bed. They are trying to get therapy for at home. Patient's daughter needs assistance with at home care to assist with lifting patient.   Equipment is covered by the insurance they just need the doctor to sign off on it.   Darold [phone] (daughter) >> Sep 03, 2024  2:04 PM Rosina BIRCH wrote: Patient daughter called stating [facility] have not received the order yet and the tramadol  that the patient received from the hospital is not working for her. The patient need something stronger for pain sent to her pharmacy Fax-[phone] CB for daughter [phone] >> Sep 02, 2024  2:32 PM Mia F wrote: Pt daughter is calling to see when [facility] will send the equipment needed for her mother. Please call Darold [phone] (daughter) >> Aug 31, 2024  3:41 PM Rea BROCKS wrote: [facility]-[phone] [place]- [phone] [employer]- [phone]   These pharmacies provide the medical bed and walker with a seat.   Patient was seen at ER last night. Testing was done and she was shown to have arthritis all through her body. And, patients daughter is going to pick up tramadol  for patient.   Patient's daughter would also like to speak with Dr. Joshua about home health aide.

## 2024-09-04 ENCOUNTER — Other Ambulatory Visit (HOSPITAL_COMMUNITY): Payer: Self-pay

## 2024-09-04 ENCOUNTER — Other Ambulatory Visit: Payer: Self-pay | Admitting: Internal Medicine

## 2024-09-04 ENCOUNTER — Other Ambulatory Visit: Payer: Self-pay

## 2024-09-04 DIAGNOSIS — E876 Hypokalemia: Secondary | ICD-10-CM

## 2024-09-04 NOTE — Telephone Encounter (Signed)
 This is a duplicate message. I have gotten all of this rolling for the patient. I have spoken with her daughter on multiple occasions on the phone and via [employer]. I did reach out and leave a message to return my call if she needs help with anything else. I am closing this call.

## 2024-09-05 ENCOUNTER — Other Ambulatory Visit: Payer: Self-pay | Admitting: Internal Medicine

## 2024-09-05 DIAGNOSIS — M1712 Unilateral primary osteoarthritis, left knee: Secondary | ICD-10-CM

## 2024-09-05 DIAGNOSIS — Z515 Encounter for palliative care: Secondary | ICD-10-CM | POA: Insufficient documentation

## 2024-09-05 MED ORDER — OXYCODONE HCL 5 MG PO TABS: 5.0000 mg | ORAL_TABLET | Freq: Three times a day (TID) | ORAL | 0 refills | Status: DC | PRN

## 2024-09-07 ENCOUNTER — Telehealth: Payer: Self-pay

## 2024-09-07 NOTE — Telephone Encounter (Signed)
 Copied from CRM [id number]. Topic: Clinical - Order For Equipment >> Sep 07, 2024  3:44 PM Mesmerise C wrote: Reason for CRM: Patient's daughter Darold stated spoek to [facility] and waiting for face to face notes and needing them urgently would like a call back from [place] at [phone]

## 2024-09-09 ENCOUNTER — Ambulatory Visit (INDEPENDENT_AMBULATORY_CARE_PROVIDER_SITE_OTHER): Admitting: Physician Assistant

## 2024-09-09 ENCOUNTER — Encounter: Payer: Self-pay | Admitting: Physician Assistant

## 2024-09-09 DIAGNOSIS — M25561 Pain in right knee: Secondary | ICD-10-CM | POA: Diagnosis not present

## 2024-09-09 DIAGNOSIS — M25552 Pain in left hip: Secondary | ICD-10-CM | POA: Diagnosis not present

## 2024-09-09 DIAGNOSIS — M25551 Pain in right hip: Secondary | ICD-10-CM

## 2024-09-09 DIAGNOSIS — M25562 Pain in left knee: Secondary | ICD-10-CM

## 2024-09-09 DIAGNOSIS — G8929 Other chronic pain: Secondary | ICD-10-CM

## 2024-09-09 NOTE — Progress Notes (Signed)
 Office Visit Note   Patient: Leah Stafford           Date of Birth: [birth year]           MRN: [id number] Visit Date: 09/09/2024              Requested by: Joshua Debby CROME, MD [address] [place],  [place] [address] PCP: Joshua Debby CROME, MD   Assessment & Plan: Visit Diagnoses:  1. Bilateral hip pain   2. Chronic pain of both knees     Plan: Leah Stafford is a very pleasant 89 year old woman who is accompanied by her daughters.  She has multiple joints that have arthritis.  Most recently left hip and bilateral knees.  She has had no particular injury.  She does not really complain of pain but more stiffness at this point.  We discussed having her work with physical therapy for fall prevention improving her posture and strengthening her lower extremities.  She could consider an injection into her knee or hip in the future if she develops a lot of pain but I would save that for when she actually has more pain and she has today family is in agreement  Follow-Up Instructions: Return if symptoms worsen or fail to improve.   Orders:  Orders Placed This Encounter  Procedures   Ambulatory referral to Physical Therapy   No orders of the defined types were placed in this encounter.     Procedures: No procedures performed   Clinical Data: No additional findings.   Subjective: No chief complaint on file.   HPI Leah Stafford is a very pleasant 89 year old woman who is accompanied by her daughters.  She shows up today complaining of right knee left knee and hip left hip pain.  She denies any injury.  These have been chronic problems and while she does not have a lot of pain she feels like she has a lot of stiffness  Review of Systems  All other systems reviewed and are negative.    Objective: Vital Signs: There were no vitals taken for this visit.  Physical Exam Constitutional:      Appearance: Normal appearance.  Pulmonary:     Effort: Pulmonary effort is normal.  Skin:     General: Skin is warm and dry.  Neurological:     General: No focal deficit present.     Mental Status: She is alert and oriented to person, place, and time.  Psychiatric:        Mood and Affect: Mood normal.        Behavior: Behavior normal.     Ortho Exam Examination of her left hip she has limited external and internal rotation no real tenderness in the groin.  Her strength is actually fairly good.  She is neurovascular intact.  Her knees she has bilateral crepitus with range of motion no instability.  Compartments are soft nontender negative Homans' sign no effusion no erythema Specialty Comments:  No specialty comments available.  Imaging: No results found.   PMFS History: Patient Active Problem List   Diagnosis Date Noted   Encounter for palliative care involving management of pain 09/05/2024   Hypokalemia, inadequate intake 07/21/2024   CKD (chronic kidney disease) stage 4, GFR 15-29 ml/min (HCC) 10/22/2023   Encounter for general adult medical examination with abnormal findings 11/21/2021   Primary osteoarthritis of left knee 07/29/2017   Pure hypercholesterolemia 02/12/2017   Vitreous hemorrhage of right eye (HCC) 02/07/2016   Left posterior capsular opacification  08/02/2015   Pseudophakia of both eyes 08/02/2015   Diabetic macular edema (HCC) 07/20/2013   Diabetes mellitus with ophthalmic manifestation (HCC) 04/06/2013   Hypertension 03/30/2013   Past Medical History:  Diagnosis Date   Arthritis    Cataract    Depression    Diabetes mellitus without complication (HCC)    Diet and excercise   Diabetic retinopathy (HCC)    High cholesterol    Hypertension     Family History  Problem Relation Age of Onset   Cancer Father    Hyperlipidemia Sister    Hypertension Sister    Hypertension Brother    Hyperlipidemia Brother    Heart disease Brother     Past Surgical History:  Procedure Laterality Date   EYE SURGERY     TUBAL LIGATION     Social History    Occupational History   Occupation: retired  Tobacco Use   Smoking status: Never   Smokeless tobacco: Former    Types: Snuff  Vaping Use   Vaping status: Never Used  Substance and Sexual Activity   Alcohol use: No   Drug use: No   Sexual activity: Not Currently

## 2024-09-09 NOTE — Telephone Encounter (Signed)
 Resent the script for the 3rd time and faxed the face to face notes

## 2024-09-10 ENCOUNTER — Other Ambulatory Visit (HOSPITAL_COMMUNITY): Payer: Self-pay

## 2024-09-10 DIAGNOSIS — F03C0 Unspecified dementia, severe, without behavioral disturbance, psychotic disturbance, mood disturbance, and anxiety: Secondary | ICD-10-CM | POA: Insufficient documentation

## 2024-09-10 NOTE — Addendum Note (Signed)
 Addended by: JOSHUA DEBBY CROME on: 09/10/2024 07:37 AM   Modules accepted: Level of Service

## 2024-09-11 ENCOUNTER — Other Ambulatory Visit: Payer: Self-pay

## 2024-09-11 ENCOUNTER — Encounter (HOSPITAL_COMMUNITY): Payer: Self-pay

## 2024-09-11 ENCOUNTER — Emergency Department (HOSPITAL_COMMUNITY)

## 2024-09-11 ENCOUNTER — Inpatient Hospital Stay (HOSPITAL_COMMUNITY)
Admission: EM | Admit: 2024-09-11 | Discharge: 2024-09-26 | DRG: 871 | Disposition: E | Attending: Pulmonary Disease | Admitting: Pulmonary Disease

## 2024-09-11 DIAGNOSIS — Z87891 Personal history of nicotine dependence: Secondary | ICD-10-CM

## 2024-09-11 DIAGNOSIS — I21A1 Myocardial infarction type 2: Secondary | ICD-10-CM | POA: Diagnosis present

## 2024-09-11 DIAGNOSIS — I468 Cardiac arrest due to other underlying condition: Secondary | ICD-10-CM | POA: Diagnosis not present

## 2024-09-11 DIAGNOSIS — D65 Disseminated intravascular coagulation [defibrination syndrome]: Secondary | ICD-10-CM | POA: Diagnosis present

## 2024-09-11 DIAGNOSIS — E11319 Type 2 diabetes mellitus with unspecified diabetic retinopathy without macular edema: Secondary | ICD-10-CM | POA: Diagnosis present

## 2024-09-11 DIAGNOSIS — I129 Hypertensive chronic kidney disease with stage 1 through stage 4 chronic kidney disease, or unspecified chronic kidney disease: Secondary | ICD-10-CM | POA: Diagnosis present

## 2024-09-11 DIAGNOSIS — A411 Sepsis due to other specified staphylococcus: Secondary | ICD-10-CM | POA: Diagnosis not present

## 2024-09-11 DIAGNOSIS — K449 Diaphragmatic hernia without obstruction or gangrene: Secondary | ICD-10-CM | POA: Diagnosis not present

## 2024-09-11 DIAGNOSIS — Z515 Encounter for palliative care: Secondary | ICD-10-CM

## 2024-09-11 DIAGNOSIS — G9341 Metabolic encephalopathy: Secondary | ICD-10-CM | POA: Diagnosis present

## 2024-09-11 DIAGNOSIS — N1832 Chronic kidney disease, stage 3b: Secondary | ICD-10-CM | POA: Diagnosis present

## 2024-09-11 DIAGNOSIS — E1122 Type 2 diabetes mellitus with diabetic chronic kidney disease: Secondary | ICD-10-CM | POA: Diagnosis present

## 2024-09-11 DIAGNOSIS — E1165 Type 2 diabetes mellitus with hyperglycemia: Secondary | ICD-10-CM | POA: Diagnosis present

## 2024-09-11 DIAGNOSIS — K559 Vascular disorder of intestine, unspecified: Secondary | ICD-10-CM | POA: Diagnosis present

## 2024-09-11 DIAGNOSIS — Z83438 Family history of other disorder of lipoprotein metabolism and other lipidemia: Secondary | ICD-10-CM

## 2024-09-11 DIAGNOSIS — R570 Cardiogenic shock: Secondary | ICD-10-CM | POA: Diagnosis not present

## 2024-09-11 DIAGNOSIS — R578 Other shock: Secondary | ICD-10-CM | POA: Diagnosis present

## 2024-09-11 DIAGNOSIS — R410 Disorientation, unspecified: Secondary | ICD-10-CM | POA: Diagnosis not present

## 2024-09-11 DIAGNOSIS — F0390 Unspecified dementia without behavioral disturbance: Secondary | ICD-10-CM | POA: Diagnosis present

## 2024-09-11 DIAGNOSIS — Z79899 Other long term (current) drug therapy: Secondary | ICD-10-CM

## 2024-09-11 DIAGNOSIS — R6521 Severe sepsis with septic shock: Secondary | ICD-10-CM | POA: Diagnosis present

## 2024-09-11 DIAGNOSIS — E875 Hyperkalemia: Secondary | ICD-10-CM | POA: Diagnosis present

## 2024-09-11 DIAGNOSIS — Z8249 Family history of ischemic heart disease and other diseases of the circulatory system: Secondary | ICD-10-CM

## 2024-09-11 DIAGNOSIS — I959 Hypotension, unspecified: Secondary | ICD-10-CM | POA: Diagnosis not present

## 2024-09-11 DIAGNOSIS — E8721 Acute metabolic acidosis: Secondary | ICD-10-CM | POA: Diagnosis present

## 2024-09-11 DIAGNOSIS — N189 Chronic kidney disease, unspecified: Secondary | ICD-10-CM | POA: Diagnosis present

## 2024-09-11 DIAGNOSIS — N179 Acute kidney failure, unspecified: Secondary | ICD-10-CM | POA: Diagnosis present

## 2024-09-11 DIAGNOSIS — R58 Hemorrhage, not elsewhere classified: Secondary | ICD-10-CM | POA: Diagnosis not present

## 2024-09-11 DIAGNOSIS — R404 Transient alteration of awareness: Secondary | ICD-10-CM | POA: Diagnosis not present

## 2024-09-11 DIAGNOSIS — Z66 Do not resuscitate: Secondary | ICD-10-CM | POA: Diagnosis present

## 2024-09-11 DIAGNOSIS — E7800 Pure hypercholesterolemia, unspecified: Secondary | ICD-10-CM | POA: Diagnosis present

## 2024-09-11 DIAGNOSIS — R579 Shock, unspecified: Principal | ICD-10-CM | POA: Diagnosis present

## 2024-09-11 DIAGNOSIS — K7200 Acute and subacute hepatic failure without coma: Secondary | ICD-10-CM | POA: Diagnosis present

## 2024-09-11 DIAGNOSIS — K8020 Calculus of gallbladder without cholecystitis without obstruction: Secondary | ICD-10-CM | POA: Diagnosis not present

## 2024-09-11 DIAGNOSIS — R55 Syncope and collapse: Secondary | ICD-10-CM | POA: Diagnosis not present

## 2024-09-11 LAB — CBC WITH DIFFERENTIAL/PLATELET
Abs Immature Granulocytes: 0.28 K/uL — ABNORMAL HIGH (ref 0.00–0.07)
Basophils Absolute: 0.1 K/uL (ref 0.0–0.1)
Basophils Relative: 0 %
Eosinophils Absolute: 0.1 K/uL (ref 0.0–0.5)
Eosinophils Relative: 1 %
HCT: 28 % — ABNORMAL LOW (ref 36.0–46.0)
Hemoglobin: 8.4 g/dL — ABNORMAL LOW (ref 12.0–15.0)
Immature Granulocytes: 1 %
Lymphocytes Relative: 5 %
Lymphs Abs: 1.1 K/uL (ref 0.7–4.0)
MCH: 32.1 pg (ref 26.0–34.0)
MCHC: 30 g/dL (ref 30.0–36.0)
MCV: 106.9 fL — ABNORMAL HIGH (ref 80.0–100.0)
Monocytes Absolute: 1 K/uL (ref 0.1–1.0)
Monocytes Relative: 4 %
Neutro Abs: 20.3 K/uL — ABNORMAL HIGH (ref 1.7–7.7)
Neutrophils Relative %: 89 %
Platelets: 620 K/uL — ABNORMAL HIGH (ref 150–400)
RBC: 2.62 MIL/uL — ABNORMAL LOW (ref 3.87–5.11)
RDW: 16.3 % — ABNORMAL HIGH (ref 11.5–15.5)
WBC: 22.8 K/uL — ABNORMAL HIGH (ref 4.0–10.5)
nRBC: 0.3 % — ABNORMAL HIGH (ref 0.0–0.2)

## 2024-09-11 LAB — COMPREHENSIVE METABOLIC PANEL WITH GFR
ALT: 54 U/L — ABNORMAL HIGH (ref 0–44)
AST: 55 U/L — ABNORMAL HIGH (ref 15–41)
Albumin: 2.7 g/dL — ABNORMAL LOW (ref 3.5–5.0)
Alkaline Phosphatase: 71 U/L (ref 38–126)
Anion gap: 25 — ABNORMAL HIGH (ref 5–15)
BUN: 80 mg/dL — ABNORMAL HIGH (ref 8–23)
CO2: 7 mmol/L — ABNORMAL LOW (ref 22–32)
Calcium: 9.4 mg/dL (ref 8.9–10.3)
Chloride: 121 mmol/L — ABNORMAL HIGH (ref 98–111)
Creatinine, Ser: 3.17 mg/dL — ABNORMAL HIGH (ref 0.44–1.00)
GFR, Estimated: 14 mL/min — ABNORMAL LOW (ref >60)
Glucose, Bld: 169 mg/dL — ABNORMAL HIGH (ref 70–99)
Potassium: 5.6 mmol/L — ABNORMAL HIGH (ref 3.5–5.1)
Sodium: 153 mmol/L — ABNORMAL HIGH (ref 135–145)
Total Bilirubin: 1.1 mg/dL (ref 0.0–1.2)
Total Protein: 6.8 g/dL (ref 6.5–8.1)

## 2024-09-11 LAB — TROPONIN I (HIGH SENSITIVITY): Troponin I (High Sensitivity): 41 ng/L — ABNORMAL HIGH (ref <18)

## 2024-09-11 LAB — I-STAT CG4 LACTIC ACID, ED: Lactic Acid, Venous: 9.7 mmol/L (ref 0.5–1.9)

## 2024-09-11 LAB — POC OCCULT BLOOD, ED: Fecal Occult Bld: POSITIVE — AB

## 2024-09-11 MED ORDER — METRONIDAZOLE 500 MG/100ML IV SOLN
500.0000 mg | Freq: Once | INTRAVENOUS | Status: AC
  Administered 2024-09-12: 500 mg via INTRAVENOUS
  Filled 2024-09-11: qty 100

## 2024-09-11 MED ORDER — LACTATED RINGERS IV SOLN: INTRAVENOUS | Status: DC

## 2024-09-11 MED ORDER — VANCOMYCIN HCL IN DEXTROSE 1-5 GM/200ML-% IV SOLN
1000.0000 mg | Freq: Once | INTRAVENOUS | Status: AC
  Administered 2024-09-12: 1000 mg via INTRAVENOUS
  Filled 2024-09-11 (×2): qty 200

## 2024-09-11 MED ORDER — SODIUM CHLORIDE 0.9 % IV SOLN
2.0000 g | Freq: Once | INTRAVENOUS | Status: AC
  Administered 2024-09-11: 2 g via INTRAVENOUS
  Filled 2024-09-11: qty 12.5

## 2024-09-11 MED ORDER — SODIUM CHLORIDE 0.9 % IV BOLUS
1000.0000 mL | Freq: Once | INTRAVENOUS | Status: AC
  Administered 2024-09-12: 1000 mL via INTRAVENOUS

## 2024-09-11 MED ORDER — SODIUM CHLORIDE 0.9 % IV BOLUS
1000.0000 mL | Freq: Once | INTRAVENOUS | Status: AC
  Administered 2024-09-11: 1000 mL via INTRAVENOUS

## 2024-09-11 NOTE — ED Provider Notes (Addendum)
 [facility]ER[facility]Y DEPARTMENT AT [facility] Provider Note   CSN: [id number] Arrival date & time: 09/11/24  2112     Patient presents with: Leah Stafford is a 89 y.o. female.   This is a 89 year old female presenting emergency department after syncopal episode.  Was found unresponsive on the commode by family who noted that she was normal 10 to 15 minutes prior.  Does have underlying dementia and sounds to be back to baseline per EMS report.  Patient unreliable historian, but denies any pain and states that she feels pretty good.  Denies chest pain, endorses some shortness of breath, denies abdominal pain.  She is unsure why she is here.        Prior to Admission medications   Medication Sig Start Date End Date Taking? Authorizing Provider  COLOSTRUM PO Take 1 capsule by mouth in the morning.   Yes [provider]  conjugated estrogens (PREMARIN) vaginal cream Place pea-sized amount of cream to the vagina every night for 2 weeks and then 2 times a week thereafter. 09/09/19  Yes [provider]  lidocaine  (LIDODERM ) 5 % Place 1 patch onto the skin daily. Remove & Discard patch within 12 hours or as directed by MD   Yes [provider]  Misc Natural Products (COLON CLEANSE) CAPS Take 1 capsule by mouth every evening.   Yes [provider]  Multiple Vitamin (MULTIVITAMIN WITH MINERALS) TABS tablet Take 1 tablet by mouth daily.   Yes [provider]  Nutritional Supplements (BEE POLLEN/ROYAL JELLY/HONEY PO) Take 1 capsule by mouth in the morning.   Yes [provider]  hydrALAZINE  (APRESOLINE ) 100 MG tablet Take 1 tablet (100 mg total) by mouth 3 (three) times daily. 12/04/23 2024/09/20 Yes Chandrasekhar, Mahesh A, MD  potassium chloride  (KLOR-CON  10) 10 MEQ tablet Take 1 tablet (10 mEq total) by mouth 2 (two) times daily. 08/14/24 2024-09-20 Yes Joshua Debby CROME, MD    Allergies: Codeine    Review of  Systems  Updated Vital Signs BP (!) 155/51   Pulse 77   Temp 99.4 F (37.4 C) (Axillary)   Resp 14   Ht 5' 3.5 (1.613 m)   Wt 56.3 kg   SpO2 100%   BMI 21.64 kg/m   Physical Exam Vitals and nursing note reviewed.  Constitutional:      General: She is not in acute distress. HENT:     Head: Normocephalic.     Nose: Nose normal.     Mouth/Throat:     Mouth: Mucous membranes are dry.  Eyes:     Conjunctiva/sclera: Conjunctivae normal.  Cardiovascular:     Rate and Rhythm: Regular rhythm. Tachycardia present.  Pulmonary:     Effort: Pulmonary effort is normal.     Breath sounds: No wheezing, rhonchi or rales.  Abdominal:     General: Abdomen is flat. There is no distension.     Palpations: Abdomen is soft.     Tenderness: There is no abdominal tenderness. There is no guarding or rebound.  Genitourinary:    Comments: Sacral decubitus noted.  Also had dark tarry stool Musculoskeletal:     Right lower leg: No edema.     Left lower leg: No edema.  Skin:    General: Skin is warm.     Capillary Refill: Capillary refill takes less than 2 seconds.  Neurological:     General: No focal deficit present.     Mental Status: She is alert.  Sensory: No sensory deficit.     Motor: No weakness.     (all labs ordered are listed, but only abnormal results are displayed) Labs Reviewed  CULTURE, BLOOD (ROUTINE X 2) - Abnormal; Notable for the following components:      Result Value   Culture STAPHYLOCOCCUS LUGDUNENSIS (*)    All other components within normal limits  MRSA NEXT GEN BY PCR, NASAL - Abnormal; Notable for the following components:   MRSA by PCR Next Gen DETECTED (*)    All other components within normal limits  BLOOD CULTURE ID PANEL (REFLEXED) - BCID2 - Abnormal; Notable for the following components:   Staphylococcus species DETECTED (*)    Staphylococcus lugdunensis DETECTED (*)    Methicillin resistance mecA/C DETECTED (*)    All other components within normal  limits  CBC WITH DIFFERENTIAL/PLATELET - Abnormal; Notable for the following components:   WBC 22.8 (*)    RBC 2.62 (*)    Hemoglobin 8.4 (*)    HCT 28.0 (*)    MCV 106.9 (*)    RDW 16.3 (*)    Platelets 620 (*)    nRBC 0.3 (*)    Neutro Abs 20.3 (*)    Abs Immature Granulocytes 0.28 (*)    All other components within normal limits  COMPREHENSIVE METABOLIC PANEL WITH GFR - Abnormal; Notable for the following components:   Sodium 153 (*)    Potassium 5.6 (*)    Chloride 121 (*)    CO2 7 (*)    Glucose, Bld 169 (*)    BUN 80 (*)    Creatinine, Ser 3.17 (*)    Albumin 2.7 (*)    AST 55 (*)    ALT 54 (*)    GFR, Estimated 14 (*)    Anion gap 25 (*)    All other components within normal limits  URINALYSIS, ROUTINE W REFLEX MICROSCOPIC - Abnormal; Notable for the following components:   APPearance HAZY (*)    Ketones, ur 5 (*)    Protein, ur 30 (*)    Bacteria, UA RARE (*)    All other components within normal limits  URINALYSIS, ROUTINE W REFLEX MICROSCOPIC - Abnormal; Notable for the following components:   Color, Urine AMBER (*)    APPearance CLOUDY (*)    Glucose, UA 50 (*)    Hgb urine dipstick MODERATE (*)    Ketones, ur 5 (*)    Protein, ur 100 (*)    Bacteria, UA FEW (*)    All other components within normal limits  CBC - Abnormal; Notable for the following components:   WBC 16.6 (*)    RBC 1.69 (*)    Hemoglobin 5.4 (*)    HCT 18.5 (*)    MCV 109.5 (*)    MCHC 29.2 (*)    RDW 16.0 (*)    nRBC 2.0 (*)    All other components within normal limits  BASIC METABOLIC PANEL WITH GFR - Abnormal; Notable for the following components:   Sodium 157 (*)    Potassium 5.9 (*)    Chloride 117 (*)    CO2 8 (*)    Glucose, Bld 243 (*)    BUN 72 (*)    Creatinine, Ser 3.30 (*)    Calcium  8.8 (*)    GFR, Estimated 13 (*)    Anion gap 32 (*)    All other components within normal limits  PHOSPHORUS - Abnormal; Notable for the following components:   Phosphorus  10.6 (*)     All other components within normal limits  LACTIC ACID, PLASMA - Abnormal; Notable for the following components:   Lactic Acid, Venous >9.0 (*)    All other components within normal limits  LACTIC ACID, PLASMA - Abnormal; Notable for the following components:   Lactic Acid, Venous >9.0 (*)    All other components within normal limits  PROTIME-INR - Abnormal; Notable for the following components:   Prothrombin Time 41.8 (*)    INR 4.1 (*)    All other components within normal limits  CBC - Abnormal; Notable for the following components:   WBC 13.4 (*)    RBC 3.10 (*)    Hemoglobin 9.1 (*)    HCT 27.5 (*)    RDW 21.1 (*)    nRBC 2.2 (*)    All other components within normal limits  CBC - Abnormal; Notable for the following components:   WBC 17.7 (*)    Hemoglobin 11.6 (*)    HCT 34.1 (*)    RDW 20.7 (*)    nRBC 3.5 (*)    All other components within normal limits  HEMOGLOBIN AND HEMATOCRIT, BLOOD - Abnormal; Notable for the following components:   Hemoglobin 8.2 (*)    HCT 25.7 (*)    All other components within normal limits  GLUCOSE, CAPILLARY - Abnormal; Notable for the following components:   Glucose-Capillary 162 (*)    All other components within normal limits  GLUCOSE, CAPILLARY - Abnormal; Notable for the following components:   Glucose-Capillary 314 (*)    All other components within normal limits  DIC (DISSEMINATED INTRAVASCULAR COAGULATION)PANEL - Abnormal; Notable for the following components:   Prothrombin Time 36.5 (*)    INR 3.5 (*)    aPTT 54 (*)    Fibrinogen 182 (*)    D-Dimer, Quant >20.00 (*)    All other components within normal limits  COMPREHENSIVE METABOLIC PANEL WITH GFR - Abnormal; Notable for the following components:   Sodium 150 (*)    Potassium 5.3 (*)    CO2 8 (*)    Glucose, Bld 357 (*)    BUN 70 (*)    Creatinine, Ser 3.14 (*)    Calcium  8.4 (*)    Total Protein 4.7 (*)    Albumin 1.8 (*)    AST 3,091 (*)    ALT 2,544 (*)     Total Bilirubin 1.5 (*)    GFR, Estimated 14 (*)    Anion gap 31 (*)    All other components within normal limits  CBC - Abnormal; Notable for the following components:   WBC 17.2 (*)    RBC 2.68 (*)    Hemoglobin 8.3 (*)    HCT 26.5 (*)    RDW 18.3 (*)    nRBC 1.5 (*)    All other components within normal limits  ACETAMINOPHEN  LEVEL - Abnormal; Notable for the following components:   Acetaminophen  (Tylenol ), Serum <10 (*)    All other components within normal limits  CK - Abnormal; Notable for the following components:   Total CK 591 (*)    All other components within normal limits  GLUCOSE, CAPILLARY - Abnormal; Notable for the following components:   Glucose-Capillary 379 (*)    All other components within normal limits  TRIGLYCERIDES - Abnormal; Notable for the following components:   Triglycerides 323 (*)    All other components within normal limits  COMPREHENSIVE METABOLIC PANEL WITH GFR - Abnormal; Notable for  the following components:   Sodium 146 (*)    Potassium 5.4 (*)    CO2 21 (*)    Glucose, Bld 268 (*)    BUN 73 (*)    Creatinine, Ser 3.54 (*)    Calcium  7.2 (*)    Total Protein 4.9 (*)    Albumin 2.1 (*)    AST 4,948 (*)    ALT 2,371 (*)    Total Bilirubin 3.9 (*)    GFR, Estimated 12 (*)    Anion gap 20 (*)    All other components within normal limits  GLUCOSE, CAPILLARY - Abnormal; Notable for the following components:   Glucose-Capillary 323 (*)    All other components within normal limits  GLUCOSE, CAPILLARY - Abnormal; Notable for the following components:   Glucose-Capillary 305 (*)    All other components within normal limits  GLUCOSE, CAPILLARY - Abnormal; Notable for the following components:   Glucose-Capillary 267 (*)    All other components within normal limits  GLUCOSE, CAPILLARY - Abnormal; Notable for the following components:   Glucose-Capillary 266 (*)    All other components within normal limits  GLUCOSE, CAPILLARY - Abnormal;  Notable for the following components:   Glucose-Capillary 173 (*)    All other components within normal limits  GLUCOSE, CAPILLARY - Abnormal; Notable for the following components:   Glucose-Capillary 31 (*)    All other components within normal limits  GLUCOSE, CAPILLARY - Abnormal; Notable for the following components:   Glucose-Capillary 166 (*)    All other components within normal limits  GLUCOSE, CAPILLARY - Abnormal; Notable for the following components:   Glucose-Capillary 68 (*)    All other components within normal limits  POC OCCULT BLOOD, ED - Abnormal; Notable for the following components:   Fecal Occult Bld POSITIVE (*)    All other components within normal limits  I-STAT CG4 LACTIC ACID, ED - Abnormal; Notable for the following components:   Lactic Acid, Venous 9.7 (*)    All other components within normal limits  I-STAT CG4 LACTIC ACID, ED - Abnormal; Notable for the following components:   Lactic Acid, Venous 13.6 (*)    All other components within normal limits  POCT I-STAT 7, (LYTES, BLD GAS, ICA,H+H) - Abnormal; Notable for the following components:   pH, Arterial 7.140 (*)    pCO2 arterial 22.6 (*)    pO2, Arterial 398 (*)    Bicarbonate 7.7 (*)    TCO2 8 (*)    Acid-base deficit 20.0 (*)    Sodium 151 (*)    Potassium 5.5 (*)    Calcium , Ion 1.14 (*)    HCT 20.0 (*)    Hemoglobin 6.8 (*)    All other components within normal limits  POCT I-STAT 7, (LYTES, BLD GAS, ICA,H+H) - Abnormal; Notable for the following components:   pH, Arterial 7.302 (*)    pCO2 arterial 18.1 (*)    pO2, Arterial 78 (*)    Bicarbonate 9.3 (*)    TCO2 10 (*)    Acid-base deficit 16.0 (*)    Sodium 149 (*)    Calcium , Ion 1.11 (*)    HCT 24.0 (*)    Hemoglobin 8.2 (*)    All other components within normal limits  POCT I-STAT 7, (LYTES, BLD GAS, ICA,H+H) - Abnormal; Notable for the following components:   pH, Arterial 7.348 (*)    pCO2 arterial 27.3 (*)    pO2, Arterial 235  (*)  Bicarbonate 15.4 (*)    TCO2 16 (*)    Acid-base deficit 10.0 (*)    Sodium 149 (*)    Calcium , Ion 1.09 (*)    HCT 24.0 (*)    Hemoglobin 8.2 (*)    All other components within normal limits  TROPONIN I (HIGH SENSITIVITY) - Abnormal; Notable for the following components:   Troponin I (High Sensitivity) 41 (*)    All other components within normal limits  TROPONIN I (HIGH SENSITIVITY) - Abnormal; Notable for the following components:   Troponin I (High Sensitivity) 45 (*)    All other components within normal limits  CULTURE, BLOOD (ROUTINE X 2)  STREP PNEUMONIAE URINARY ANTIGEN  LEGIONELLA PNEUMOPHILA SEROGP 1 UR AG  MAGNESIUM  FIBRINOGEN  APTT  PROCALCITONIN  GLUCOSE, CAPILLARY  I-STAT VENOUS BLOOD GAS, ED  TYPE AND SCREEN  ABO/RH  PREPARE RBC (CROSSMATCH)  PREPARE FRESH FROZEN PLASMA  PREPARE RBC (CROSSMATCH)  PREPARE FRESH FROZEN PLASMA  BLOOD TRANSFUSION REPORT - SCANNED   Narrative:    Ordered by an unspecified provider.    EKG: EKG Interpretation Date/Time:  Friday September 11 2024 21:20:35 EDT Ventricular Rate:  114 PR Interval:  118 QRS Duration:  100 QT Interval:  345 QTC Calculation: 476 R Axis:   44  Text Interpretation: Sinus tachycardia Borderline repolarization abnormality Confirmed by Neysa Clap [phone]) on 09/11/2024 10:18:39 PM  Radiology: No results found.    .Critical Care  Performed by: Neysa Clap PARAS, DO Authorized by: Neysa Clap PARAS, DO   Critical care provider statement:    Critical care time (minutes):  30   Critical care was necessary to treat or prevent imminent or life-threatening deterioration of the following conditions:  Shock, sepsis and circulatory failure   Critical care was time spent personally by me on the following activities:  Development of treatment plan with patient or surrogate, discussions with consultants, evaluation of patient's response to treatment, examination of patient, ordering and review of  laboratory studies, ordering and review of radiographic studies, ordering and performing treatments and interventions, pulse oximetry, re-evaluation of patient's condition and review of old charts    Medications Ordered in the ED  EPINEPHrine (ADRENALIN) 1 MG/ML (  Not Given 09/12/24 0428)  sodium chloride 0.9 % bolus 1,000 mL (0 mLs Intravenous Stopped 09/11/24 2348)  ceFEPIme (MAXIPIME) 2 g in sodium chloride 0.9 % 100 mL IVPB (0 g Intravenous Stopped 09/11/24 2348)  metroNIDAZOLE (FLAGYL) IVPB 500 mg (0 mg Intravenous Stopped 09/12/24 0115)  vancomycin (VANCOCIN) IVPB 1000 mg/200 mL premix (0 mg Intravenous Stopped 09/12/24 0707)  sodium chloride 0.9 % bolus 1,000 mL (0 mLs Intravenous Stopped 09/12/24 0110)  iohexol  (OMNIPAQUE ) 350 MG/ML injection 75 mL (75 mLs Intravenous Contrast Given 09/12/24 0251)  lactated ringers bolus 1,000 mL (0 mLs Intravenous Stopping previously hung infusion 09/12/24 0557)  sodium bicarbonate injection 100 mEq (100 mEq Intravenous Given 09/12/24 0305)  EPINEPHrine (ADRENALIN) 1 MG/10ML injection (  Given 09/12/24 0330)  0.9 %  sodium chloride infusion (Manually program via Guardrails IV Fluids) ( Intravenous New Bag/Given 09/12/24 1234)  phytonadione (VITAMIN K) 5 mg in dextrose 5 % 50 mL IVPB (0 mg Intravenous Stopped 09/12/24 1134)  sodium zirconium cyclosilicate (LOKELMA) packet 10 g (10 g Per Tube Given 09/13/24 0943)    Clinical Course as of 09/24/24 1040  Fri Sep 11, 2024  2218 Fecal Occult Blood, POC(!): POSITIVE [TY]  2218 Lactic Acid, Venous(!!): 9.7 2/2 hypotension?  [TY]  2231 WBC(!): 22.8  Elevated with tachycardia and WBC. Infectious? Will get blood cultures. Start emperic antibiotics. CT abd/pelvis ordered to evaluate for infectious.  [TY]  2233 Hemoglobin(!): 8.4 Was 12 one month ago.  [TY]  2257 Daughter present at bedside noted that mother was in her bed, got up went to the bathroom, vomited, then had diarrhea and went unresponsive.  Has  been in normal state of health for the past few days.  No significant complaints.  She did note that mother is full code. [TY]  2320 DG Chest 2 View IMPRESSION: 1. No acute intrathoracic process. 2. Hiatal hernia.   Electronically Signed   [TY]  2322 Comprehensive metabolic panel(!) Severe dehydration.  AKI.  Transaminitis noted as well.  Anion gap. [TY]  2323 Troponin I (High Sensitivity)(!): 41 EKG without ischemic changes. [TY]  2323 Care signed out to overnight team, dispo pending completion of workup.  Will need admission. [TY]    Clinical Course User Index [TY] Neysa Caron PARAS, DO                                 Medical Decision Making This is an 89 year old female with complicated past medical history to include hypertension, diabetes, arthritis, dementia presented to the emergency department after syncopal episode.  EMS reports that she was found unresponsive with initial blood pressure in the 70s systolic.  Noted to have dark tarry stool in the commode.  EMS reports that she is on a blood thinner of some kind, but unsure what.  Received a liter of LR and route with improvement of blood pressure and mentation.  Patient currently able to tell me her name, thinks she is in her bedroom does not know the year.  She has no localizing neurodeficits on exam.  She is tachypneic, but clear lungs.  Endorsing some shortness of breath.  Otherwise benign exam.  Will get screening labs.  Dark tarry stool noted on exam.  Fecal occult ordered.  Bedside ultrasound performed by me with no pericardial effusion.  Subjective preserved EF.  No B-lines on lung fields.  IVC small with near collapse with inspiration. IVFs ordered.   See ED course for further mdm/dispo.   Amount and/or Complexity of Data Reviewed Independent Historian: EMS External Data Reviewed:     Details: Do not see blood thinner listed on home meds.  No previous echo on file.  Do not see prior colonoscopy/endoscopy either. Labs:  ordered. Decision-making details documented in ED Course. Radiology: ordered and independent interpretation performed. Decision-making details documented in ED Course. ECG/medicine tests: independent interpretation performed.    Details: Sinus tachycardia.  Risk Prescription drug management. Decision regarding hospitalization. Diagnosis or treatment significantly limited by social determinants of health. Risk Details: Dementia       Final diagnoses:  Shock circulatory [facility])    ED Discharge Orders     None          Neysa Caron PARAS, DO 09/11/24 2337    Neysa Caron PARAS, DO 09/24/24 1040

## 2024-09-11 NOTE — Sepsis Progress Note (Signed)
 Elink monitoring for the code sepsis protocol.

## 2024-09-11 NOTE — ED Triage Notes (Signed)
 89 y.o. female BIB EMS, found unresponsive on bedside commode, initial BP in the 70's. Dark tarry stool seen in the commode, pt is on blood thinners. GCS w/ EMS 8-9, EMS gave 1000 mL of LR.   BP 70 palpated Resp 50/min

## 2024-09-12 ENCOUNTER — Inpatient Hospital Stay (HOSPITAL_COMMUNITY)

## 2024-09-12 ENCOUNTER — Inpatient Hospital Stay (HOSPITAL_COMMUNITY): Admitting: Certified Registered Nurse Anesthetist

## 2024-09-12 DIAGNOSIS — F0390 Unspecified dementia without behavioral disturbance: Secondary | ICD-10-CM | POA: Diagnosis present

## 2024-09-12 DIAGNOSIS — N189 Chronic kidney disease, unspecified: Secondary | ICD-10-CM | POA: Diagnosis present

## 2024-09-12 DIAGNOSIS — I129 Hypertensive chronic kidney disease with stage 1 through stage 4 chronic kidney disease, or unspecified chronic kidney disease: Secondary | ICD-10-CM | POA: Diagnosis present

## 2024-09-12 DIAGNOSIS — I468 Cardiac arrest due to other underlying condition: Secondary | ICD-10-CM | POA: Diagnosis not present

## 2024-09-12 DIAGNOSIS — N179 Acute kidney failure, unspecified: Secondary | ICD-10-CM

## 2024-09-12 DIAGNOSIS — R579 Shock, unspecified: Secondary | ICD-10-CM | POA: Diagnosis not present

## 2024-09-12 DIAGNOSIS — Z66 Do not resuscitate: Secondary | ICD-10-CM | POA: Diagnosis present

## 2024-09-12 DIAGNOSIS — K7200 Acute and subacute hepatic failure without coma: Secondary | ICD-10-CM

## 2024-09-12 DIAGNOSIS — I469 Cardiac arrest, cause unspecified: Secondary | ICD-10-CM

## 2024-09-12 DIAGNOSIS — Z87891 Personal history of nicotine dependence: Secondary | ICD-10-CM | POA: Diagnosis not present

## 2024-09-12 DIAGNOSIS — K559 Vascular disorder of intestine, unspecified: Secondary | ICD-10-CM

## 2024-09-12 DIAGNOSIS — Z452 Encounter for adjustment and management of vascular access device: Secondary | ICD-10-CM | POA: Diagnosis not present

## 2024-09-12 DIAGNOSIS — R55 Syncope and collapse: Secondary | ICD-10-CM | POA: Diagnosis not present

## 2024-09-12 DIAGNOSIS — E1122 Type 2 diabetes mellitus with diabetic chronic kidney disease: Secondary | ICD-10-CM | POA: Diagnosis present

## 2024-09-12 DIAGNOSIS — E11319 Type 2 diabetes mellitus with unspecified diabetic retinopathy without macular edema: Secondary | ICD-10-CM | POA: Diagnosis present

## 2024-09-12 DIAGNOSIS — K8020 Calculus of gallbladder without cholecystitis without obstruction: Secondary | ICD-10-CM | POA: Diagnosis not present

## 2024-09-12 DIAGNOSIS — Z515 Encounter for palliative care: Secondary | ICD-10-CM | POA: Diagnosis not present

## 2024-09-12 DIAGNOSIS — I21A1 Myocardial infarction type 2: Secondary | ICD-10-CM | POA: Diagnosis present

## 2024-09-12 DIAGNOSIS — R9431 Abnormal electrocardiogram [ECG] [EKG]: Secondary | ICD-10-CM

## 2024-09-12 DIAGNOSIS — J989 Respiratory disorder, unspecified: Secondary | ICD-10-CM | POA: Diagnosis not present

## 2024-09-12 DIAGNOSIS — K922 Gastrointestinal hemorrhage, unspecified: Secondary | ICD-10-CM | POA: Diagnosis not present

## 2024-09-12 DIAGNOSIS — A419 Sepsis, unspecified organism: Secondary | ICD-10-CM | POA: Diagnosis not present

## 2024-09-12 DIAGNOSIS — K8070 Calculus of gallbladder and bile duct without cholecystitis without obstruction: Secondary | ICD-10-CM | POA: Diagnosis not present

## 2024-09-12 DIAGNOSIS — A411 Sepsis due to other specified staphylococcus: Secondary | ICD-10-CM | POA: Diagnosis present

## 2024-09-12 DIAGNOSIS — R6521 Severe sepsis with septic shock: Secondary | ICD-10-CM | POA: Diagnosis present

## 2024-09-12 DIAGNOSIS — N261 Atrophy of kidney (terminal): Secondary | ICD-10-CM | POA: Diagnosis not present

## 2024-09-12 DIAGNOSIS — E1165 Type 2 diabetes mellitus with hyperglycemia: Secondary | ICD-10-CM | POA: Diagnosis present

## 2024-09-12 DIAGNOSIS — G9341 Metabolic encephalopathy: Secondary | ICD-10-CM | POA: Diagnosis present

## 2024-09-12 DIAGNOSIS — Z4682 Encounter for fitting and adjustment of non-vascular catheter: Secondary | ICD-10-CM | POA: Diagnosis not present

## 2024-09-12 DIAGNOSIS — R578 Other shock: Secondary | ICD-10-CM | POA: Diagnosis present

## 2024-09-12 DIAGNOSIS — E8721 Acute metabolic acidosis: Secondary | ICD-10-CM | POA: Diagnosis present

## 2024-09-12 DIAGNOSIS — Z8249 Family history of ischemic heart disease and other diseases of the circulatory system: Secondary | ICD-10-CM | POA: Diagnosis not present

## 2024-09-12 DIAGNOSIS — R188 Other ascites: Secondary | ICD-10-CM | POA: Diagnosis not present

## 2024-09-12 DIAGNOSIS — I700 Atherosclerosis of aorta: Secondary | ICD-10-CM | POA: Diagnosis not present

## 2024-09-12 DIAGNOSIS — E875 Hyperkalemia: Secondary | ICD-10-CM | POA: Diagnosis present

## 2024-09-12 DIAGNOSIS — N1832 Chronic kidney disease, stage 3b: Secondary | ICD-10-CM | POA: Diagnosis present

## 2024-09-12 DIAGNOSIS — D65 Disseminated intravascular coagulation [defibrination syndrome]: Secondary | ICD-10-CM | POA: Diagnosis present

## 2024-09-12 DIAGNOSIS — E7800 Pure hypercholesterolemia, unspecified: Secondary | ICD-10-CM | POA: Diagnosis present

## 2024-09-12 DIAGNOSIS — K449 Diaphragmatic hernia without obstruction or gangrene: Secondary | ICD-10-CM | POA: Diagnosis not present

## 2024-09-12 LAB — ABO/RH: ABO/RH(D): O POS

## 2024-09-12 LAB — BLOOD CULTURE ID PANEL (REFLEXED) - BCID2
A.calcoaceticus-baumannii: NOT DETECTED
Bacteroides fragilis: NOT DETECTED
Candida albicans: NOT DETECTED
Candida auris: NOT DETECTED
Candida glabrata: NOT DETECTED
Candida krusei: NOT DETECTED
Candida parapsilosis: NOT DETECTED
Candida tropicalis: NOT DETECTED
Cryptococcus neoformans/gattii: NOT DETECTED
Enterobacter cloacae complex: NOT DETECTED
Enterobacterales: NOT DETECTED
Enterococcus Faecium: NOT DETECTED
Enterococcus faecalis: NOT DETECTED
Escherichia coli: NOT DETECTED
Haemophilus influenzae: NOT DETECTED
Klebsiella aerogenes: NOT DETECTED
Klebsiella oxytoca: NOT DETECTED
Klebsiella pneumoniae: NOT DETECTED
Listeria monocytogenes: NOT DETECTED
Methicillin resistance mecA/C: DETECTED — AB
Neisseria meningitidis: NOT DETECTED
Proteus species: NOT DETECTED
Pseudomonas aeruginosa: NOT DETECTED
Salmonella species: NOT DETECTED
Serratia marcescens: NOT DETECTED
Staphylococcus aureus (BCID): NOT DETECTED
Staphylococcus epidermidis: NOT DETECTED
Staphylococcus lugdunensis: DETECTED — AB
Staphylococcus species: DETECTED — AB
Stenotrophomonas maltophilia: NOT DETECTED
Streptococcus agalactiae: NOT DETECTED
Streptococcus pneumoniae: NOT DETECTED
Streptococcus pyogenes: NOT DETECTED
Streptococcus species: NOT DETECTED

## 2024-09-12 LAB — GLUCOSE, CAPILLARY
Glucose-Capillary: 162 mg/dL — ABNORMAL HIGH (ref 70–99)
Glucose-Capillary: 267 mg/dL — ABNORMAL HIGH (ref 70–99)
Glucose-Capillary: 305 mg/dL — ABNORMAL HIGH (ref 70–99)
Glucose-Capillary: 314 mg/dL — ABNORMAL HIGH (ref 70–99)
Glucose-Capillary: 323 mg/dL — ABNORMAL HIGH (ref 70–99)
Glucose-Capillary: 379 mg/dL — ABNORMAL HIGH (ref 70–99)

## 2024-09-12 LAB — CBC
HCT: 18.5 % — ABNORMAL LOW (ref 36.0–46.0)
HCT: 26.5 % — ABNORMAL LOW (ref 36.0–46.0)
HCT: 27.5 % — ABNORMAL LOW (ref 36.0–46.0)
HCT: 34.1 % — ABNORMAL LOW (ref 36.0–46.0)
Hemoglobin: 5.4 g/dL — CL (ref 12.0–15.0)
Hemoglobin: 8.3 g/dL — ABNORMAL LOW (ref 12.0–15.0)
Hemoglobin: 9.1 g/dL — ABNORMAL LOW (ref 12.0–15.0)
Hemoglobin: 11.6 g/dL — ABNORMAL LOW (ref 12.0–15.0)
MCH: 32 pg (ref 26.0–34.0)
MCH: 31 pg (ref 26.0–34.0)
MCH: 29.4 pg (ref 26.0–34.0)
MCH: 28.9 pg (ref 26.0–34.0)
MCHC: 29.2 g/dL — ABNORMAL LOW (ref 30.0–36.0)
MCHC: 31.3 g/dL (ref 30.0–36.0)
MCHC: 33.1 g/dL (ref 30.0–36.0)
MCHC: 34 g/dL (ref 30.0–36.0)
MCV: 109.5 fL — ABNORMAL HIGH (ref 80.0–100.0)
MCV: 98.9 fL (ref 80.0–100.0)
MCV: 88.7 fL (ref 80.0–100.0)
MCV: 85 fL (ref 80.0–100.0)
Platelets: 361 K/uL (ref 150–400)
Platelets: 323 K/uL (ref 150–400)
Platelets: 246 K/uL (ref 150–400)
Platelets: 211 K/uL (ref 150–400)
RBC: 1.69 MIL/uL — ABNORMAL LOW (ref 3.87–5.11)
RBC: 2.68 MIL/uL — ABNORMAL LOW (ref 3.87–5.11)
RBC: 3.1 MIL/uL — ABNORMAL LOW (ref 3.87–5.11)
RBC: 4.01 MIL/uL (ref 3.87–5.11)
RDW: 16 % — ABNORMAL HIGH (ref 11.5–15.5)
RDW: 18.3 % — ABNORMAL HIGH (ref 11.5–15.5)
RDW: 21.1 % — ABNORMAL HIGH (ref 11.5–15.5)
RDW: 20.7 % — ABNORMAL HIGH (ref 11.5–15.5)
WBC: 16.6 K/uL — ABNORMAL HIGH (ref 4.0–10.5)
WBC: 17.2 K/uL — ABNORMAL HIGH (ref 4.0–10.5)
WBC: 13.4 K/uL — ABNORMAL HIGH (ref 4.0–10.5)
WBC: 17.7 K/uL — ABNORMAL HIGH (ref 4.0–10.5)
nRBC: 2 % — ABNORMAL HIGH (ref 0.0–0.2)
nRBC: 1.5 % — ABNORMAL HIGH (ref 0.0–0.2)
nRBC: 2.2 % — ABNORMAL HIGH (ref 0.0–0.2)
nRBC: 3.5 % — ABNORMAL HIGH (ref 0.0–0.2)

## 2024-09-12 LAB — URINALYSIS, ROUTINE W REFLEX MICROSCOPIC
Bilirubin Urine: NEGATIVE
Bilirubin Urine: NEGATIVE
Glucose, UA: NEGATIVE mg/dL
Glucose, UA: 50 mg/dL — AB
Hgb urine dipstick: NEGATIVE
Ketones, ur: 5 mg/dL — AB
Ketones, ur: 5 mg/dL — AB
Leukocytes,Ua: NEGATIVE
Leukocytes,Ua: NEGATIVE
Nitrite: NEGATIVE
Nitrite: NEGATIVE
Protein, ur: 30 mg/dL — AB
Protein, ur: 100 mg/dL — AB
Specific Gravity, Urine: 1.014 (ref 1.005–1.030)
Specific Gravity, Urine: 1.029 (ref 1.005–1.030)
pH: 5 (ref 5.0–8.0)
pH: 5 (ref 5.0–8.0)

## 2024-09-12 LAB — POCT I-STAT 7, (LYTES, BLD GAS, ICA,H+H)
Acid-base deficit: 20 mmol/L — ABNORMAL HIGH (ref 0.0–2.0)
Acid-base deficit: 16 mmol/L — ABNORMAL HIGH (ref 0.0–2.0)
Acid-base deficit: 10 mmol/L — ABNORMAL HIGH (ref 0.0–2.0)
Bicarbonate: 7.7 mmol/L — ABNORMAL LOW (ref 20.0–28.0)
Bicarbonate: 9.3 mmol/L — ABNORMAL LOW (ref 20.0–28.0)
Bicarbonate: 15.4 mmol/L — ABNORMAL LOW (ref 20.0–28.0)
Calcium, Ion: 1.14 mmol/L — ABNORMAL LOW (ref 1.15–1.40)
Calcium, Ion: 1.11 mmol/L — ABNORMAL LOW (ref 1.15–1.40)
Calcium, Ion: 1.09 mmol/L — ABNORMAL LOW (ref 1.15–1.40)
HCT: 20 % — ABNORMAL LOW (ref 36.0–46.0)
HCT: 24 % — ABNORMAL LOW (ref 36.0–46.0)
HCT: 24 % — ABNORMAL LOW (ref 36.0–46.0)
Hemoglobin: 6.8 g/dL — CL (ref 12.0–15.0)
Hemoglobin: 8.2 g/dL — ABNORMAL LOW (ref 12.0–15.0)
Hemoglobin: 8.2 g/dL — ABNORMAL LOW (ref 12.0–15.0)
O2 Saturation: 100 %
O2 Saturation: 96 %
O2 Saturation: 100 %
Patient temperature: 99
Patient temperature: 33.7
Patient temperature: 35
Potassium: 5.5 mmol/L — ABNORMAL HIGH (ref 3.5–5.1)
Potassium: 5.1 mmol/L (ref 3.5–5.1)
Potassium: 4.8 mmol/L (ref 3.5–5.1)
Sodium: 151 mmol/L — ABNORMAL HIGH (ref 135–145)
Sodium: 149 mmol/L — ABNORMAL HIGH (ref 135–145)
Sodium: 149 mmol/L — ABNORMAL HIGH (ref 135–145)
TCO2: 8 mmol/L — ABNORMAL LOW (ref 22–32)
TCO2: 10 mmol/L — ABNORMAL LOW (ref 22–32)
TCO2: 16 mmol/L — ABNORMAL LOW (ref 22–32)
pCO2 arterial: 22.6 mmHg — ABNORMAL LOW (ref 32–48)
pCO2 arterial: 18.1 mmHg — CL (ref 32–48)
pCO2 arterial: 27.3 mmHg — ABNORMAL LOW (ref 32–48)
pH, Arterial: 7.14 — CL (ref 7.35–7.45)
pH, Arterial: 7.302 — ABNORMAL LOW (ref 7.35–7.45)
pH, Arterial: 7.348 — ABNORMAL LOW (ref 7.35–7.45)
pO2, Arterial: 398 mmHg — ABNORMAL HIGH (ref 83–108)
pO2, Arterial: 78 mmHg — ABNORMAL LOW (ref 83–108)
pO2, Arterial: 235 mmHg — ABNORMAL HIGH (ref 83–108)

## 2024-09-12 LAB — ECHOCARDIOGRAM COMPLETE
AR max vel: 1.76 cm2
AV Area VTI: 1.66 cm2
AV Area mean vel: 2.03 cm2
AV Mean grad: 5 mmHg
AV Peak grad: 10.4 mmHg
Ao pk vel: 1.61 m/s
Area-P 1/2: 2.58 cm2
Calc EF: 71.8 %
Est EF: >75
Height: 63.5 in
S' Lateral: 2.6 cm
Single Plane A2C EF: 56.9 %
Single Plane A4C EF: 77.9 %
Weight: 1978.85 [oz_av]

## 2024-09-12 LAB — BASIC METABOLIC PANEL WITH GFR
Anion gap: 32 — ABNORMAL HIGH (ref 5–15)
BUN: 72 mg/dL — ABNORMAL HIGH (ref 8–23)
CO2: 8 mmol/L — ABNORMAL LOW (ref 22–32)
Calcium: 8.8 mg/dL — ABNORMAL LOW (ref 8.9–10.3)
Chloride: 117 mmol/L — ABNORMAL HIGH (ref 98–111)
Creatinine, Ser: 3.3 mg/dL — ABNORMAL HIGH (ref 0.44–1.00)
GFR, Estimated: 13 mL/min — ABNORMAL LOW (ref >60)
Glucose, Bld: 243 mg/dL — ABNORMAL HIGH (ref 70–99)
Potassium: 5.9 mmol/L — ABNORMAL HIGH (ref 3.5–5.1)
Sodium: 157 mmol/L — ABNORMAL HIGH (ref 135–145)

## 2024-09-12 LAB — DIC (DISSEMINATED INTRAVASCULAR COAGULATION)PANEL
D-Dimer, Quant: >20 ug{FEU}/mL — ABNORMAL HIGH (ref 0.00–0.50)
Fibrinogen: 182 mg/dL — ABNORMAL LOW (ref 210–475)
INR: 3.5 — ABNORMAL HIGH (ref 0.8–1.2)
Platelets: 300 K/uL (ref 150–400)
Prothrombin Time: 36.5 s — ABNORMAL HIGH (ref 11.4–15.2)
Smear Review: NONE SEEN
aPTT: 54 s — ABNORMAL HIGH (ref 24–36)

## 2024-09-12 LAB — MAGNESIUM: Magnesium: 2.3 mg/dL (ref 1.7–2.4)

## 2024-09-12 LAB — COMPREHENSIVE METABOLIC PANEL WITH GFR
ALT: 2544 U/L — ABNORMAL HIGH (ref 0–44)
AST: 3091 U/L — ABNORMAL HIGH (ref 15–41)
Albumin: 1.8 g/dL — ABNORMAL LOW (ref 3.5–5.0)
Alkaline Phosphatase: 74 U/L (ref 38–126)
Anion gap: 31 — ABNORMAL HIGH (ref 5–15)
BUN: 70 mg/dL — ABNORMAL HIGH (ref 8–23)
CO2: 8 mmol/L — ABNORMAL LOW (ref 22–32)
Calcium: 8.4 mg/dL — ABNORMAL LOW (ref 8.9–10.3)
Chloride: 111 mmol/L (ref 98–111)
Creatinine, Ser: 3.14 mg/dL — ABNORMAL HIGH (ref 0.44–1.00)
GFR, Estimated: 14 mL/min — ABNORMAL LOW (ref >60)
Glucose, Bld: 357 mg/dL — ABNORMAL HIGH (ref 70–99)
Potassium: 5.3 mmol/L — ABNORMAL HIGH (ref 3.5–5.1)
Sodium: 150 mmol/L — ABNORMAL HIGH (ref 135–145)
Total Bilirubin: 1.5 mg/dL — ABNORMAL HIGH (ref 0.0–1.2)
Total Protein: 4.7 g/dL — ABNORMAL LOW (ref 6.5–8.1)

## 2024-09-12 LAB — PHOSPHORUS: Phosphorus: 10.6 mg/dL — ABNORMAL HIGH (ref 2.5–4.6)

## 2024-09-12 LAB — PROCALCITONIN: Procalcitonin: 1.12 ng/mL

## 2024-09-12 LAB — PROTIME-INR
INR: 4.1 (ref 0.8–1.2)
Prothrombin Time: 41.8 s — ABNORMAL HIGH (ref 11.4–15.2)

## 2024-09-12 LAB — HEMOGLOBIN AND HEMATOCRIT, BLOOD
HCT: 25.7 % — ABNORMAL LOW (ref 36.0–46.0)
Hemoglobin: 8.2 g/dL — ABNORMAL LOW (ref 12.0–15.0)

## 2024-09-12 LAB — PREPARE RBC (CROSSMATCH)

## 2024-09-12 LAB — LACTIC ACID, PLASMA
Lactic Acid, Venous: >9 mmol/L (ref 0.5–1.9)
Lactic Acid, Venous: >9 mmol/L (ref 0.5–1.9)

## 2024-09-12 LAB — I-STAT CG4 LACTIC ACID, ED: Lactic Acid, Venous: 13.6 mmol/L (ref 0.5–1.9)

## 2024-09-12 LAB — ACETAMINOPHEN LEVEL: Acetaminophen (Tylenol), Serum: <10 ug/mL — ABNORMAL LOW (ref 10–30)

## 2024-09-12 LAB — APTT: aPTT: 32 s (ref 24–36)

## 2024-09-12 LAB — FIBRINOGEN: Fibrinogen: 240 mg/dL (ref 210–475)

## 2024-09-12 LAB — TROPONIN I (HIGH SENSITIVITY): Troponin I (High Sensitivity): 45 ng/L — ABNORMAL HIGH (ref <18)

## 2024-09-12 LAB — CK: Total CK: 591 U/L — ABNORMAL HIGH (ref 38–234)

## 2024-09-12 LAB — MRSA NEXT GEN BY PCR, NASAL: MRSA by PCR Next Gen: DETECTED — AB

## 2024-09-12 LAB — STREP PNEUMONIAE URINARY ANTIGEN: Strep Pneumo Urinary Antigen: NEGATIVE

## 2024-09-12 MED ORDER — FLUDROCORTISONE ACETATE 0.1 MG PO TABS
0.0500 mg | ORAL_TABLET | Freq: Every day | ORAL | Status: DC
  Filled 2024-09-12: qty 0.5

## 2024-09-12 MED ORDER — ORAL CARE MOUTH RINSE
15.0000 mL | OROMUCOSAL | Status: DC
  Administered 2024-09-12 – 2024-09-13 (×14): 15 mL via OROMUCOSAL

## 2024-09-12 MED ORDER — VASOPRESSIN 20 UNITS/100 ML INFUSION FOR SHOCK
0.0000 [IU]/min | INTRAVENOUS | Status: DC
  Administered 2024-09-12 – 2024-09-13 (×4): 0.03 [IU]/min via INTRAVENOUS
  Filled 2024-09-12 (×4): qty 100

## 2024-09-12 MED ORDER — COLLAGENASE 250 UNIT/GM EX OINT
TOPICAL_OINTMENT | Freq: Every day | CUTANEOUS | Status: DC
Start: 2024-09-12 — End: 2024-09-13
  Filled 2024-09-12: qty 30

## 2024-09-12 MED ORDER — DOCUSATE SODIUM 100 MG PO CAPS: 100.0000 mg | ORAL_CAPSULE | Freq: Two times a day (BID) | ORAL | Status: DC | PRN

## 2024-09-12 MED ORDER — MEDIHONEY WOUND/BURN DRESSING EX PSTE
1.0000 | PASTE | Freq: Every day | CUTANEOUS | Status: DC
  Filled 2024-09-12: qty 44

## 2024-09-12 MED ORDER — SODIUM CHLORIDE 0.9 % IV SOLN
50.0000 ug/h | INTRAVENOUS | Status: DC
  Administered 2024-09-12 – 2024-09-13 (×3): 50 ug/h via INTRAVENOUS
  Filled 2024-09-12 (×4): qty 1

## 2024-09-12 MED ORDER — LACTATED RINGERS IV BOLUS
1000.0000 mL | Freq: Once | INTRAVENOUS | Status: AC
  Administered 2024-09-12: 1000 mL via INTRAVENOUS

## 2024-09-12 MED ORDER — NOREPINEPHRINE 4 MG/250ML-% IV SOLN: 0.0000 ug/min | INTRAVENOUS | Status: DC

## 2024-09-12 MED ORDER — PANTOPRAZOLE SODIUM 40 MG IV SOLR
40.0000 mg | Freq: Two times a day (BID) | INTRAVENOUS | Status: DC
  Administered 2024-09-12 – 2024-09-13 (×3): 40 mg via INTRAVENOUS
  Filled 2024-09-12 (×3): qty 10

## 2024-09-12 MED ORDER — ACETYLCYSTEINE LOAD VIA INFUSION
150.0000 mg/kg | Freq: Once | INTRAVENOUS | Status: DC
  Filled 2024-09-12: qty 276

## 2024-09-12 MED ORDER — PROPOFOL 1000 MG/100ML IV EMUL
INTRAVENOUS | Status: AC
  Administered 2024-09-12: 13.228 ug/kg/min via INTRAVENOUS
  Filled 2024-09-12: qty 100

## 2024-09-12 MED ORDER — SODIUM BICARBONATE 8.4 % IV SOLN: 100.0000 meq | Freq: Once | INTRAVENOUS | Status: AC

## 2024-09-12 MED ORDER — CHLORHEXIDINE GLUCONATE CLOTH 2 % EX PADS
6.0000 | MEDICATED_PAD | Freq: Every day | CUTANEOUS | Status: DC
  Administered 2024-09-12: 6 via TOPICAL

## 2024-09-12 MED ORDER — DEXTROSE 50 % IV SOLN: 12.5000 g | INTRAVENOUS | Status: DC

## 2024-09-12 MED ORDER — DEXTROSE 5 % IV SOLN
12.5000 mg/kg/h | INTRAVENOUS | Status: DC
  Filled 2024-09-12: qty 90

## 2024-09-12 MED ORDER — ETOMIDATE 2 MG/ML IV SOLN
INTRAVENOUS | Status: DC | PRN
  Administered 2024-09-12: 10 mg via INTRAVENOUS

## 2024-09-12 MED ORDER — FENTANYL 2500MCG IN NS 250ML (10MCG/ML) PREMIX INFUSION
0.0000 ug/h | INTRAVENOUS | Status: DC
  Administered 2024-09-12: 100 ug/h via INTRAVENOUS
  Filled 2024-09-12: qty 250

## 2024-09-12 MED ORDER — SODIUM CHLORIDE 0.9% IV SOLUTION: Freq: Once | INTRAVENOUS | Status: DC

## 2024-09-12 MED ORDER — LACTATED RINGERS IV BOLUS: 500.0000 mL | Freq: Once | INTRAVENOUS | Status: DC

## 2024-09-12 MED ORDER — EPINEPHRINE PF 1 MG/ML IJ SOLN
INTRAMUSCULAR | Status: AC
  Filled 2024-09-12: qty 1

## 2024-09-12 MED ORDER — HYDROCORTISONE SOD SUC (PF) 100 MG IJ SOLR
100.0000 mg | Freq: Two times a day (BID) | INTRAMUSCULAR | Status: DC
  Administered 2024-09-12 – 2024-09-13 (×3): 100 mg via INTRAVENOUS
  Filled 2024-09-12 (×3): qty 2

## 2024-09-12 MED ORDER — THIAMINE HCL 100 MG/ML IJ SOLN
200.0000 mg | Freq: Two times a day (BID) | INTRAVENOUS | Status: DC
  Administered 2024-09-12 – 2024-09-13 (×3): 200 mg via INTRAVENOUS
  Filled 2024-09-12 (×7): qty 2

## 2024-09-12 MED ORDER — IOHEXOL 350 MG/ML SOLN
75.0000 mL | Freq: Once | INTRAVENOUS | Status: AC | PRN
  Administered 2024-09-12: 75 mL via INTRAVENOUS

## 2024-09-12 MED ORDER — CHLORHEXIDINE GLUCONATE CLOTH 2 % EX PADS
6.0000 | MEDICATED_PAD | CUTANEOUS | Status: DC
  Administered 2024-09-12: 6 via TOPICAL

## 2024-09-12 MED ORDER — EPINEPHRINE 0.1 MG/10ML (10 MCG/ML) SYRINGE FOR IV PUSH (FOR BLOOD PRESSURE SUPPORT)
5.0000 ug | PREFILLED_SYRINGE | Freq: Once | INTRAVENOUS | Status: DC | PRN
Start: 2024-09-12 — End: 2024-09-13

## 2024-09-12 MED ORDER — SODIUM BICARBONATE 8.4 % IV SOLN
INTRAVENOUS | Status: DC
  Filled 2024-09-12 (×3): qty 1000

## 2024-09-12 MED ORDER — SODIUM CHLORIDE 0.9% IV SOLUTION: Freq: Once | INTRAVENOUS | Status: AC

## 2024-09-12 MED ORDER — ORAL CARE MOUTH RINSE: 15.0000 mL | OROMUCOSAL | Status: DC | PRN

## 2024-09-12 MED ORDER — SODIUM BICARBONATE 8.4 % IV SOLN
INTRAVENOUS | Status: AC
  Administered 2024-09-12: 100 meq via INTRAVENOUS
  Filled 2024-09-12: qty 100

## 2024-09-12 MED ORDER — PROPOFOL 1000 MG/100ML IV EMUL
0.0000 ug/kg/min | INTRAVENOUS | Status: DC
  Administered 2024-09-12: 35 ug/kg/min via INTRAVENOUS
  Administered 2024-09-12: 10 ug/kg/min via INTRAVENOUS
  Administered 2024-09-12: 35 ug/kg/min via INTRAVENOUS
  Administered 2024-09-12: 25 ug/kg/min via INTRAVENOUS
  Administered 2024-09-13: 35 ug/kg/min via INTRAVENOUS
  Filled 2024-09-12 (×4): qty 100

## 2024-09-12 MED ORDER — CHLORHEXIDINE GLUCONATE CLOTH 2 % EX PADS: 6.0000 | MEDICATED_PAD | Freq: Every day | CUTANEOUS | Status: DC

## 2024-09-12 MED ORDER — POLYETHYLENE GLYCOL 3350 17 G PO PACK: 17.0000 g | PACK | Freq: Every day | ORAL | Status: DC | PRN

## 2024-09-12 MED ORDER — SUCCINYLCHOLINE CHLORIDE 200 MG/10ML IV SOSY
PREFILLED_SYRINGE | INTRAVENOUS | Status: DC | PRN
  Administered 2024-09-12: 120 mg via INTRAVENOUS

## 2024-09-12 MED ORDER — VANCOMYCIN HCL IN DEXTROSE 1-5 GM/200ML-% IV SOLN: 1000.0000 mg | Freq: Two times a day (BID) | INTRAVENOUS | Status: DC

## 2024-09-12 MED ORDER — ACETAMINOPHEN 325 MG PO TABS
650.0000 mg | ORAL_TABLET | Freq: Four times a day (QID) | ORAL | Status: DC | PRN
Start: 2024-09-12 — End: 2024-09-13

## 2024-09-12 MED ORDER — NOREPINEPHRINE 16 MG/250ML-% IV SOLN
0.0000 ug/min | INTRAVENOUS | Status: DC
  Administered 2024-09-12: 9 ug/min via INTRAVENOUS
  Administered 2024-09-12: 30 ug/min via INTRAVENOUS
  Filled 2024-09-12 (×2): qty 250

## 2024-09-12 MED ORDER — METRONIDAZOLE 500 MG/100ML IV SOLN
500.0000 mg | Freq: Two times a day (BID) | INTRAVENOUS | Status: DC
  Administered 2024-09-12 – 2024-09-13 (×2): 500 mg via INTRAVENOUS
  Filled 2024-09-12 (×2): qty 100

## 2024-09-12 MED ORDER — VITAMIN K1 10 MG/ML IJ SOLN
5.0000 mg | INTRAVENOUS | Status: AC
  Administered 2024-09-12: 5 mg via INTRAVENOUS
  Filled 2024-09-12: qty 0.5

## 2024-09-12 MED ORDER — DEXTROSE 5 % IV SOLN
6.2500 mg/kg/h | INTRAVENOUS | Status: DC
  Filled 2024-09-12: qty 90

## 2024-09-12 MED ORDER — PIPERACILLIN-TAZOBACTAM IN DEX 2-0.25 GM/50ML IV SOLN
2.2500 g | Freq: Three times a day (TID) | INTRAVENOUS | Status: DC
  Filled 2024-09-12: qty 50

## 2024-09-12 MED ORDER — VANCOMYCIN VARIABLE DOSE PER UNSTABLE RENAL FUNCTION (PHARMACIST DOSING): Status: DC

## 2024-09-12 MED ORDER — FENTANYL 2500MCG IN NS 250ML (10MCG/ML) PREMIX INFUSION
INTRAVENOUS | Status: AC
  Administered 2024-09-12: 50 ug/h via INTRAVENOUS
  Filled 2024-09-12: qty 250

## 2024-09-12 MED ORDER — MUPIROCIN 2 % EX OINT
1.0000 | TOPICAL_OINTMENT | Freq: Two times a day (BID) | CUTANEOUS | Status: DC
  Administered 2024-09-12 – 2024-09-13 (×3): 1 via NASAL
  Filled 2024-09-12: qty 22

## 2024-09-12 MED ORDER — EPINEPHRINE 1 MG/10ML IV SOSY
PREFILLED_SYRINGE | INTRAVENOUS | Status: AC
  Filled 2024-09-12: qty 10

## 2024-09-12 MED ORDER — SODIUM CHLORIDE 0.9 % IV SOLN
1.0000 g | INTRAVENOUS | Status: DC
  Administered 2024-09-12: 1 g via INTRAVENOUS
  Filled 2024-09-12 (×2): qty 10

## 2024-09-12 MED ORDER — NOREPINEPHRINE 4 MG/250ML-% IV SOLN
INTRAVENOUS | Status: AC
  Administered 2024-09-12: 20 ug/min via INTRAVENOUS
  Filled 2024-09-12: qty 250

## 2024-09-12 MED ORDER — VANCOMYCIN HCL 750 MG/150ML IV SOLN
750.0000 mg | INTRAVENOUS | Status: DC
  Filled 2024-09-12: qty 150

## 2024-09-12 NOTE — Anesthesia Procedure Notes (Signed)
 Procedure Name: Intubation Date/Time: 09/12/2024 3:16 AM  Performed by: Roddie Grate, CRNAPre-anesthesia Checklist: Patient identified, Emergency Drugs available, Suction available, Patient being monitored and Timeout performed Patient Re-evaluated:Patient Re-evaluated prior to induction Oxygen Delivery Method: Ambu bag Preoxygenation: Pre-oxygenation with 100% oxygen Induction Type: IV induction, Rapid sequence and Cricoid Pressure applied Ventilation: Mask ventilation without difficulty Laryngoscope Size: Glidescope and 4 Grade View: Grade I Tube type: Subglottic suction tube Tube size: 7.5 mm Number of attempts: 1 Airway Equipment and Method: Stylet and Video-laryngoscopy Placement Confirmation: ETT inserted through vocal cords under direct vision, breath sounds checked- equal and bilateral and CO2 detector Secured at: 20 cm Tube secured with: Tape Dental Injury: Teeth and Oropharynx as per pre-operative assessment  Comments: Arrived to [facility] [place]. Airway needed.

## 2024-09-12 NOTE — Progress Notes (Signed)
 ABG results. Would not transfer.  pH 7.14  PCO2 22.4  PO2 397  Be,b -20 HCO3 7.7  - FiO2 decreased from 100% to 80% after this ABG. Current vent settings: PRVC Vt: 430 R: 20 O2:80% Peep: 5

## 2024-09-12 NOTE — Procedures (Addendum)
 Central Venous Catheter Insertion Procedure Note  Leah Stafford  [id number]  27-Jul-1934  Date:09/12/24  Time:5:57 AM   Provider Performing:Kourtney Terriquez,MD  Procedure: Insertion of Non-tunneled Central Venous Catheter(36556) with US  guidance (23062)   Indication(s) Medication administration  Consent Unable to obtain consent due to emergent nature of procedure.  Anesthesia Topical only with 1% lidocaine   Patient was also already intubated, on mechanical vent on prop on fentanyl  Timeout Verified patient identification, verified procedure, site/side was marked, verified correct patient position, special equipment/implants available, medications/allergies/relevant history reviewed, required imaging and test results available.  Sterile Technique Maximal sterile technique including full sterile barrier drape, hand hygiene, sterile gown, sterile gloves, mask, hair covering, sterile ultrasound probe cover (if used).  Procedure Description Area of catheter insertion was cleaned with chlorhexidine and draped in sterile fashion.  With real-time ultrasound guidance a central venous catheter was placed into the right subclavian vein. Nonpulsatile blood flow and easy flushing noted in all ports.  The catheter was sutured in place and sterile dressing applied.  Complications/Tolerance None; patient tolerated the procedure well. Chest X-ray is ordered to verify placement for internal jugular or subclavian cannulation.    CVP appropriately waveform, CVP of 10  EBL Minimal  Specimen(s) None

## 2024-09-12 NOTE — Progress Notes (Signed)
 eLink Physician-Brief Progress Note Patient Name: Audrielle Vankuren DOB: 1934/02/07 MRN: [id number]   Date of Service  09/12/2024  HPI/Events of Note  Patient admitted to the ICU with hypotension, profound acidosis, a widened pulse pressure and indicators of GI bleeding. On my arrival to the room I ordered a stat fluid bolus, Levophed gtt which was rapidly titrated up and an ABG, but despite this her blood pressure and heart rate continued to drop so she received 2 amps of Bicarb empirically (given her lactic acidosis) and a push of 100 mcg of Epinephrine, the ground crew was requested to come and place a central line, shortly thereafter she went into a brady-arrest and ACLS protocol was immediately initiated. She achieved ROSC with the second round of Epinephrine, then the ground crew arrived and took over.  eICU Interventions  See above. New Patient Evaluation.        Lavera Vandermeer U Dylann Gallier 09/12/2024, 3:13 AM

## 2024-09-12 NOTE — Progress Notes (Signed)
 ABG results. Would not transfer.  pH, 6.85  PCO2, 35.3 PO2, 68 Be,b -24 HCO3 6.8  Current vent settings: PRVC Vt: 430 R:20 O2 100% Peep 5

## 2024-09-12 NOTE — Progress Notes (Signed)
 NAME:  Leah Stafford, MRN:  [id number], DOB:  [birth year], LOS: 0 ADMISSION DATE:  09/11/2024, CONSULTATION DATE: 09/12/2024 REFERRING MD: ED provider, CHIEF COMPLAINT: Syncope  History of Present Illness:  89 year old female with history of HTN, presents to the ED with syncope episode.  Patient was found to be unresponsive on the commode by family.  It is reported she had a dark tarry stool.  Family denies any anticoagulation, but reports required aspirin for pain PRN more than a week ago.  There was also 2 episodes of vomiting for the last 2 days, nonbloody/nonbilious. At the ED patient was found to be confused, protecting airway, tachypneic, wide pulse pressure 129/38, with lactic acid 9.7 that is worsening to 13.6 Critical care team was consulted for persisting lactic acidosis  Pertinent  Medical History  HTN  Significant Hospital Events: Including procedures, antibiotic start and stop dates in addition to other pertinent events   Cardiac arrest 10/18 early morning twice, each lasting about 4 minutes requiring emergent intubation   Interim History / Subjective:  Patient is intubated sedated On Levophed at 8 and vasopressin 0.04 Receiving blood transfusions Opens eyes to commands Had large bloody/continuous bowel movement during morning rounds  Objective    Blood pressure (!) 168/65, pulse 97, temperature (!) 93.6 F (34.2 C), temperature source Bladder, resp. rate 16, height 5' 3.5 (1.613 m), weight 56.1 kg, SpO2 100%. CVP:  [10 mmHg] 10 mmHg  Vent Mode: PRVC FiO2 (%):  [60 %-100 %] 60 % Set Rate:  [20 bmp] 20 bmp Vt Set:  [430 mL-470 mL] 430 mL PEEP:  [5 cmH20] 5 cmH20 Plateau Pressure:  [17 cmH20-20 cmH20] 17 cmH20   Intake/Output Summary (Last 24 hours) at 09/12/2024 1016 Last data filed at 09/12/2024 1001 Gross per 24 hour  Intake 3934.67 ml  Output 160 ml  Net 3774.67 ml   Filed Weights   09/12/24 0500  Weight: 56.1 kg    Examination: General: Intubated,  on mechanical ventilation HENT: NCAT, pale conjunctiva, dry mucous membrane Lungs: Dyspneic, symmetric chest wall movement, clear auscultation Cardiovascular: Tachycardia, regular rate and rhythm Abdomen: Soft nontender nondistended Extremities: Intact sensory/motor Neuro: Unable to assess   Resolved problem list   Assessment and Plan  89 year old female with history of HTN, presents with syncope episode, and dark tarry stools, and found to be in shock, with persisting lactic acidosis.  As per family, she had been sick for 2 days and had poor oral intake   Septic and hemorrhagic shock Acute metabolic encephalopathy On mechanical ventilation Acute GI bleed Acute liver failure/coagulopathy /DIC NSTEMI type II AKI on CKD Bowel ischemia secondary to shock Acute metabolic acidosis. Hyperglycemia Hyperkalemia from AKI Protein malnutrition  Plan - Hemodynamically unstable continuous GI bleed, likely from acute liver failure - Received 2 unit PRBC earlier today, will transfuse 2 more units.  Transfused 2 unit FFP.  Vitamin K 5 IV given - Trend hemoglobin Q4 -Start octreotide drip.  PPI IV twice daily - Check Tylenol  level - Trend lactate - ABG shows some improvement on acidosis.  Continue bicarb drip at 150/h.  Recheck ABG later -Echocardiogram reviewed, rules out cardiac causes of shock - Start sliding scale insulin - Recheck BMP q 4. Trend lactate q 4    Leah Haxton,MD  [facility]  Pt re-assessed multiple times through the day.  Discussed case with GI.  Updated family early morning.   My critical care time: 80 minutes  Critical care time was exclusive of separately  billable procedures and treating other patients.  Critical care was necessary to treat or prevent imminent or life-threatening deterioration.  Critical care was time spent personally by me on the following activities: development of treatment plan with patient and/or surrogate as well as  nursing, discussions with consultants, evaluation of patient's response to treatment, examination of patient, obtaining history from patient or surrogate, ordering and performing treatments and interventions, ordering and review of laboratory studies, ordering and review of radiographic studies, pulse oximetry, re-evaluation of patient's condition and participation in multidisciplinary rounds.   09/12/2024, 10:16 AM      Labs   CBC: Recent Labs  Lab 09/11/24 2206 09/12/24 0420 09/12/24 9376 09/12/24 0729 09/12/24 0843 09/12/24 0929  WBC 22.8* 16.6*  --  17.2*  --   --   NEUTROABS 20.3*  --   --   --   --   --   HGB 8.4* 5.4* 6.8* 8.3*  8.2* 8.2*  --   HCT 28.0* 18.5* 20.0* 26.5*  25.7* 24.0*  --   MCV 106.9* 109.5*  --  98.9  --   --   PLT 620* 361  --  323  --  300    Basic Metabolic Panel: Recent Labs  Lab 09/11/24 2206 09/12/24 0420 09/12/24 0623 09/12/24 0747 09/12/24 0843  NA 153* 157* 151* 150* 149*  K 5.6* 5.9* 5.5* 5.3* 5.1  CL 121* 117*  --  111  --   CO2 7* 8*  --  8*  --   GLUCOSE 169* 243*  --  357*  --   BUN 80* 72*  --  70*  --   CREATININE 3.17* 3.30*  --  3.14*  --   CALCIUM  9.4 8.8*  --  8.4*  --   MG  --  2.3  --   --   --   PHOS  --  10.6*  --   --   --    GFR: Estimated Creatinine Clearance: 10.5 mL/min (A) (by C-G formula based on SCr of 3.14 mg/dL (H)). Recent Labs  Lab 09/11/24 2206 09/11/24 2213 09/12/24 0006 09/12/24 0420 09/12/24 0729  PROCALCITON  --   --   --  1.12  --   WBC 22.8*  --   --  16.6* 17.2*  LATICACIDVEN  --  9.7* 13.6* >9.0* >9.0*    Liver Function Tests: Recent Labs  Lab 09/11/24 2206 09/12/24 0747  AST 55* 3,091*  ALT 54* 2,544*  ALKPHOS 71 74  BILITOT 1.1 1.5*  PROT 6.8 4.7*  ALBUMIN 2.7* 1.8*   No results for input(s): LIPASE, AMYLASE in the last 168 hours. No results for input(s): AMMONIA in the last 168 hours.  ABG    Component Value Date/Time   PHART 7.302 (L) 09/12/2024 0843    PCO2ART 18.1 (LL) 09/12/2024 0843   PO2ART 78 (L) 09/12/2024 0843   HCO3 9.3 (L) 09/12/2024 0843   TCO2 10 (L) 09/12/2024 0843   ACIDBASEDEF 16.0 (H) 09/12/2024 0843   O2SAT 96 09/12/2024 0843     Coagulation Profile: Recent Labs  Lab 09/12/24 0420 09/12/24 0929  INR 4.1* 3.5*    Cardiac Enzymes: No results for input(s): CKTOTAL, CKMB, CKMBINDEX, TROPONINI in the last 168 hours.  HbA1C: Hgb A1c MFr Bld  Date/Time Value Ref Range Status  07/21/2024 03:56 PM 5.8 4.6 - 6.5 % Final    Comment:    Glycemic Control Guidelines for People with Diabetes:Non Diabetic:  <6%Goal of Therapy: <7%Additional Action Suggested:  >  8%   01/29/2023 02:08 PM 5.5 4.6 - 6.5 % Final    Comment:    Glycemic Control Guidelines for People with Diabetes:Non Diabetic:  <6%Goal of Therapy: <7%Additional Action Suggested:  >8%     CBG: Recent Labs  Lab 09/12/24 0413 09/12/24 0729  GLUCAP 162* 314*    Review of Systems:   Negative except above  Past Medical History:  She,  has a past medical history of Arthritis, Cataract, Depression, Diabetes mellitus without complication (HCC), Diabetic retinopathy (HCC), High cholesterol, and Hypertension.   Surgical History:   Past Surgical History:  Procedure Laterality Date   EYE SURGERY     TUBAL LIGATION       Social History:   reports that she has never smoked. She has quit using smokeless tobacco.  Her smokeless tobacco use included snuff. She reports that she does not drink alcohol and does not use drugs.   Family History:  Her family history includes Cancer in her father; Heart disease in her brother; Hyperlipidemia in her brother and sister; Hypertension in her brother and sister.   Allergies Allergies  Allergen Reactions   Codeine Itching, Nausea And Vomiting and Other (See Comments)    Too sleepy     Home Medications  Prior to Admission medications   Medication Sig Start Date End Date Taking? Authorizing Provider   acetaminophen  (TYLENOL ) 500 MG tablet Take 2 tablets (1,000 mg total) every 8 (eight) hours as needed by mouth for moderate pain. 10/07/17   Stallings, Zoe A, MD  ammonium lactate  (LAC-HYDRIN ) 12 % lotion Apply 1 Application topically as needed for dry skin. 10/16/23   Loreda Hacker, DPM  Cholecalciferol (VITAMIN D PO) Take 5,000 Units by mouth daily. OTC, strength not known     [provider]  conjugated estrogens (PREMARIN) vaginal cream Place pea-sized amount of cream to the vagina every night for 2 weeks and then 2 times a week thereafter. 09/09/19   [provider]  Elastic Bandages & Supports (BACK SUPPORT) MISC as needed (pain). 11/11/17   [provider]  hydrALAZINE  (APRESOLINE ) 100 MG tablet Take 1 tablet (100 mg total) by mouth 3 (three) times daily. 12/04/23   Santo Stanly LABOR, MD  Incontinence Supply Disposable (ASSURANCE UNDERWEAR WOMENS) MISC daily. 11/11/17   [provider]  Multiple Vitamin (MULTIVITAMIN WITH MINERALS) TABS tablet Take 1 tablet by mouth daily.    [provider]  oxyCODONE  (OXY IR/ROXICODONE ) 5 MG immediate release tablet Take 1 tablet (5 mg total) by mouth every 8 (eight) hours as needed for severe pain (pain score 7-10). 09/05/24   Joshua Debby CROME, MD  potassium chloride  (KLOR-CON  10) 10 MEQ tablet Take 1 tablet (10 mEq total) by mouth 2 (two) times daily. 08/14/24   Joshua Debby CROME, MD  rosuvastatin  (CRESTOR ) 5 MG tablet Take 1 tablet (5 mg total) by mouth daily. 10/08/23   Santo Stanly LABOR, MD     Critical care time: 80 mins

## 2024-09-12 NOTE — Progress Notes (Signed)
 eLink Physician-Brief Progress Note Patient Name: Leah Stafford DOB: [birth year] MRN: [id number]   Date of Service  09/12/2024  HPI/Events of Note  Notified that there has been discussion during the day about comfort care. Awaiting arrival of family members.  eICU Interventions  Bicarb drip about to run out. Will consume. Elink to be informed once family ready to proceed with comfort care. Discussed with BSRN.     Intervention Category Intermediate Interventions: Communication with other healthcare providers and/or family;Other:  Damien ONEIDA Grout 09/12/2024, 8:39 PM

## 2024-09-12 NOTE — Progress Notes (Signed)
  Echocardiogram 2D Echocardiogram has been performed.  Norleen ORN [facility] 09/12/2024, 8:27 AM

## 2024-09-12 NOTE — Progress Notes (Signed)
 PHARMACY - PHYSICIAN COMMUNICATION CRITICAL VALUE ALERT - BLOOD CULTURE IDENTIFICATION (BCID)  Leah Stafford is an 89 y.o. female who presented to [facility] on 09/11/2024 with a chief complaint of GIB, sepsis   Name of physician (or Provider) Contacted: none  Current antibiotics: Vancomycin   Changes to prescribed antibiotics recommended:  Patient is on recommended antibiotics - No changes needed  Results for orders placed or performed during the hospital encounter of 09/11/24  Blood Culture ID Panel (Reflexed) (Collected: 09/11/2024 10:37 PM)  Result Value Ref Range   Enterococcus faecalis NOT DETECTED NOT DETECTED   Enterococcus Faecium NOT DETECTED NOT DETECTED   Listeria monocytogenes NOT DETECTED NOT DETECTED   Staphylococcus species DETECTED (A) NOT DETECTED   Staphylococcus aureus (BCID) NOT DETECTED NOT DETECTED   Staphylococcus epidermidis NOT DETECTED NOT DETECTED   Staphylococcus lugdunensis DETECTED (A) NOT DETECTED   Streptococcus species NOT DETECTED NOT DETECTED   Streptococcus agalactiae NOT DETECTED NOT DETECTED   Streptococcus pneumoniae NOT DETECTED NOT DETECTED   Streptococcus pyogenes NOT DETECTED NOT DETECTED   A.calcoaceticus-baumannii NOT DETECTED NOT DETECTED   Bacteroides fragilis NOT DETECTED NOT DETECTED   Enterobacterales NOT DETECTED NOT DETECTED   Enterobacter cloacae complex NOT DETECTED NOT DETECTED   Escherichia coli NOT DETECTED NOT DETECTED   Klebsiella aerogenes NOT DETECTED NOT DETECTED   Klebsiella oxytoca NOT DETECTED NOT DETECTED   Klebsiella pneumoniae NOT DETECTED NOT DETECTED   Proteus species NOT DETECTED NOT DETECTED   Salmonella species NOT DETECTED NOT DETECTED   Serratia marcescens NOT DETECTED NOT DETECTED   Haemophilus influenzae NOT DETECTED NOT DETECTED   Neisseria meningitidis NOT DETECTED NOT DETECTED   Pseudomonas aeruginosa NOT DETECTED NOT DETECTED   Stenotrophomonas maltophilia NOT DETECTED NOT DETECTED   Candida  albicans NOT DETECTED NOT DETECTED   Candida auris NOT DETECTED NOT DETECTED   Candida glabrata NOT DETECTED NOT DETECTED   Candida krusei NOT DETECTED NOT DETECTED   Candida parapsilosis NOT DETECTED NOT DETECTED   Candida tropicalis NOT DETECTED NOT DETECTED   Cryptococcus neoformans/gattii NOT DETECTED NOT DETECTED   Methicillin resistance mecA/C DETECTED (A) NOT DETECTED    Olam Chalk Pharm.D. CPP, BCPS Clinical Pharmacist [phone] 09/12/2024 10:28 PM

## 2024-09-12 NOTE — Progress Notes (Signed)
 eLink Physician-Brief Progress Note Patient Name: Leah Stafford DOB: [birth year] MRN: [id number]   Date of Service  09/12/2024  HPI/Events of Note  Notified of positive blood culture one site MRSL  eICU Interventions  Already on vancomycin, will continue for now until family is ready to proceed with comfort care     Intervention Category Intermediate Interventions: Diagnostic test evaluation  Damien ONEIDA Grout 09/12/2024, 11:39 PM

## 2024-09-12 NOTE — H&P (Signed)
 NAME:  Corinda Ammon, MRN:  [id number], DOB:  [birth year], LOS: 0 ADMISSION DATE:  09/11/2024, CONSULTATION DATE: 09/12/2024 REFERRING MD: ED provider, CHIEF COMPLAINT: Syncope  History of Present Illness:  89 year old female with history of HTN, presents to the ED with syncope episode.  Patient was found to be unresponsive on the commode by family.  It is reported she had a dark tarry stool.  Family denies any anticoagulation, but reports required aspirin for pain PRN more than a week ago.  There was also 2 episodes of vomiting for the last 2 days, nonbloody/nonbilious. At the ED patient was found to be confused, protecting airway, tachypneic, wide pulse pressure 129/38, with lactic acid 9.7 that is worsening to 13.6 Critical care team was consulted for persisting lactic acidosis  Pertinent  Medical History  HTN  Significant Hospital Events: Including procedures, antibiotic start and stop dates in addition to other pertinent events     Interim History / Subjective:    Objective    Blood pressure (!) 129/38, pulse (!) 101, temperature (!) 97.4 F (36.3 C), temperature source Axillary, resp. rate (!) 32, SpO2 100%.       No intake or output data in the 24 hours ending 09/12/24 0149 There were no vitals filed for this visit.  Examination: General: Lethargic, in mild distress HENT: NCAT, pale conjunctiva, dry mucous membrane Lungs: Dyspneic, symmetric chest wall movement, clear auscultation Cardiovascular: Tachycardia, regular rate and rhythm Abdomen: Soft nontender nondistended Extremities: Intact sensory/motor Neuro: No focal deficits, alert   Resolved problem list   Assessment and Plan  89 year old female with history of HTN, presents with syncope episode, and dark tarry stools, and found to be in shock, with persisting lactic acidosis  Acute metabolic encephalopathy Mixed shock-with wide pulse pressure, & persisting lactic acidosis >10 Distributive -likely from septic  shock, with some component of hemorrhagic shock Lactic acidosis-9.7-13.6 Flattening troponin-41-45-likely demand ischemia GI bleed--tarry stool-likely upper GI bleed AKI on CKD Metabolic acidosis, with anion gap-likely due to lactic acidosis Leukocytosis    Plan - Admit to the ICU - CT PE/abdomen/pelvis with contrast-to rule out PE, and other potential etiologies for persisting lactic acidosis with distributive shock including bowel ischemia/abscess - Complete echo - Empiric Abx cover, as we await source workup results -MAP goal> 65, or if wide pulse pressure will aim for DBP> 40 - Protonix twice daily - H&H Q8, closely monitor for bloody bowel movement - Monitor electrolytes - Trend lactic acid - Strict I&O's, monitor UOP      Casidy Alberta,MD  [facility] Prefer epic messenger for cross cover needs   My critical care time: 60 minutes  Critical care time was exclusive of separately billable procedures and treating other patients.  Critical care was necessary to treat or prevent imminent or life-threatening deterioration.  Critical care was time spent personally by me on the following activities: development of treatment plan with patient and/or surrogate as well as nursing, discussions with consultants, evaluation of patient's response to treatment, examination of patient, obtaining history from patient or surrogate, ordering and performing treatments and interventions, ordering and review of laboratory studies, ordering and review of radiographic studies, pulse oximetry, re-evaluation of patient's condition and participation in multidisciplinary rounds.   09/12/2024, 2:28 AM      Labs   CBC: Recent Labs  Lab 09/11/24 2206  WBC 22.8*  NEUTROABS 20.3*  HGB 8.4*  HCT 28.0*  MCV 106.9*  PLT 620*    Basic Metabolic Panel: Recent Labs  Lab 09/11/24 2206  NA 153*  K 5.6*  CL 121*  CO2 7*  GLUCOSE 169*  BUN 80*  CREATININE 3.17*   CALCIUM  9.4   GFR: CrCl cannot be calculated (Unknown ideal weight.). Recent Labs  Lab 09/11/24 2206 09/11/24 2213 09/12/24 0006  WBC 22.8*  --   --   LATICACIDVEN  --  9.7* 13.6*    Liver Function Tests: Recent Labs  Lab 09/11/24 2206  AST 55*  ALT 54*  ALKPHOS 71  BILITOT 1.1  PROT 6.8  ALBUMIN 2.7*   No results for input(s): LIPASE, AMYLASE in the last 168 hours. No results for input(s): AMMONIA in the last 168 hours.  ABG    Component Value Date/Time   TCO2 20 (L) 08/30/2024 2239     Coagulation Profile: No results for input(s): INR, PROTIME in the last 168 hours.  Cardiac Enzymes: No results for input(s): CKTOTAL, CKMB, CKMBINDEX, TROPONINI in the last 168 hours.  HbA1C: Hgb A1c MFr Bld  Date/Time Value Ref Range Status  07/21/2024 03:56 PM 5.8 4.6 - 6.5 % Final    Comment:    Glycemic Control Guidelines for People with Diabetes:Non Diabetic:  <6%Goal of Therapy: <7%Additional Action Suggested:  >8%   01/29/2023 02:08 PM 5.5 4.6 - 6.5 % Final    Comment:    Glycemic Control Guidelines for People with Diabetes:Non Diabetic:  <6%Goal of Therapy: <7%Additional Action Suggested:  >8%     CBG: No results for input(s): GLUCAP in the last 168 hours.  Review of Systems:   Negative except above  Past Medical History:  She,  has a past medical history of Arthritis, Cataract, Depression, Diabetes mellitus without complication (HCC), Diabetic retinopathy (HCC), High cholesterol, and Hypertension.   Surgical History:   Past Surgical History:  Procedure Laterality Date   EYE SURGERY     TUBAL LIGATION       Social History:   reports that she has never smoked. She has quit using smokeless tobacco.  Her smokeless tobacco use included snuff. She reports that she does not drink alcohol and does not use drugs.   Family History:  Her family history includes Cancer in her father; Heart disease in her brother; Hyperlipidemia in her brother  and sister; Hypertension in her brother and sister.   Allergies Allergies  Allergen Reactions   Codeine Nausea And Vomiting and Other (See Comments)    Too sleepy     Home Medications  Prior to Admission medications   Medication Sig Start Date End Date Taking? Authorizing Provider  acetaminophen  (TYLENOL ) 500 MG tablet Take 2 tablets (1,000 mg total) every 8 (eight) hours as needed by mouth for moderate pain. 10/07/17   Stallings, Zoe A, MD  ammonium lactate  (LAC-HYDRIN ) 12 % lotion Apply 1 Application topically as needed for dry skin. 10/16/23   Loreda Hacker, DPM  Cholecalciferol (VITAMIN D PO) Take 5,000 Units by mouth daily. OTC, strength not known     [provider]  conjugated estrogens (PREMARIN) vaginal cream Place pea-sized amount of cream to the vagina every night for 2 weeks and then 2 times a week thereafter. 09/09/19   [provider]  Elastic Bandages & Supports (BACK SUPPORT) MISC as needed (pain). 11/11/17   [provider]  hydrALAZINE  (APRESOLINE ) 100 MG tablet Take 1 tablet (100 mg total) by mouth 3 (three) times daily. 12/04/23   Santo Stanly LABOR, MD  Incontinence Supply Disposable (ASSURANCE UNDERWEAR WOMENS) MISC daily. 11/11/17   [provider]  Multiple Vitamin (MULTIVITAMIN WITH MINERALS) TABS tablet Take 1 tablet by mouth daily.    [provider]  oxyCODONE  (OXY IR/ROXICODONE ) 5 MG immediate release tablet Take 1 tablet (5 mg total) by mouth every 8 (eight) hours as needed for severe pain (pain score 7-10). 09/05/24   Joshua Debby CROME, MD  potassium chloride  (KLOR-CON  10) 10 MEQ tablet Take 1 tablet (10 mEq total) by mouth 2 (two) times daily. 08/14/24   Joshua Debby CROME, MD  rosuvastatin  (CRESTOR ) 5 MG tablet Take 1 tablet (5 mg total) by mouth daily. 10/08/23   Santo Stanly LABOR, MD     Critical care time: 60 mins

## 2024-09-12 NOTE — ED Provider Notes (Addendum)
 Discussed with critical care.  Patient with likely lower GI bleed, syncope, widened pulse pressure.  AKI with creatinine of 3.17.  Clinically very dry.  Received antibiotics and 30 cc/kg fluid.  Initial lactate 9.  Repeat lactate 13.56.  Critical care to evaluate.  12:34 AM Nursing reports worsening tachypnea.  Has had intermittent episodes of desaturations into the 70s.  On my evaluation she is tachypneic in the mid 5s.  Not in any respiratory distress.  Family is at the bedside.  O2 100% on room air at this time.  Confirmed with family that she is full code.  She is receiving fluids via pressure bag.  Blood pressure remains with a wide pulse pressure but MAP of 64.  Tachycardic to 105.  Discussed with patient and family that the ICU would be evaluating the patient for admission.  She may need blood products.  Given known acute bleed.   Bari Charmaine FALCON, MD 09/12/24 ARMENTA    Bari Charmaine FALCON, MD 09/12/24 [phone]

## 2024-09-12 NOTE — Consult Note (Signed)
 WOC Nurse Consult Note: Reason for Consult: US  pressure injury Wound type: Unstageable Pressure Injury Pressure Injury POA: Yes Measurement: see nursing flow sheets Wound bed:90% black/dark /10% pink Drainage (amount, consistency, odor) see nursing flow sheets Periwound: intact  Dressing procedure/placement/frequency: Cleanse buttock wound with saline, pat dry Apply Medihoney to wound, top with foam Ok to use Santyl if Medihoney not available, I have asked pharmacy to update orders accordingly.  Turn and reposition per hospital policy.   Re consult if needed, will not follow at this time. Thanks  Harveen Flesch [employer], RN,CWOCN, CNS, [employer] [phone]

## 2024-09-12 NOTE — Procedures (Signed)
 Arterial Catheter Insertion Procedure Note  Leah Stafford  [id number]  [birth year]  Date:09/12/24  Time:5:54 AM    Provider Performing: Lenny Drought    Procedure: Insertion of Arterial Line (63379) with US  guidance (23062)   Indication(s) Blood pressure monitoring and/or need for frequent ABGs  Consent Unable to obtain consent due to emergent nature of procedure.  Anesthesia 5cc of lidocaine  Patient was also already intubated on mechanical support, and propofol/fentanyl   Time Out Verified patient identification, verified procedure, site/side was marked, verified correct patient position, special equipment/implants available, medications/allergies/relevant history reviewed, required imaging and test results available.   Sterile Technique Maximal sterile technique including full sterile barrier drape, hand hygiene, sterile gown, sterile gloves, mask, hair covering, sterile ultrasound probe cover (if used).   Procedure Description Area of catheter insertion was cleaned with chlorhexidine and draped in sterile fashion. With real-time ultrasound guidance an arterial catheter was placed into the right axillary artery.  Appropriate arterial tracings confirmed on monitor.     Complications/Tolerance None; patient tolerated the procedure well.   EBL Less than 5 cc   Specimen(s) None

## 2024-09-12 NOTE — Progress Notes (Signed)
   09/12/24 0405  Spiritual Encounters  Type of Visit Initial  Reason for visit Code  OnCall Visit Yes   Chaplain responded to Code [facility] page. Per discussion with nurses, Patient's family had been at the hospital but was not present during the Chaplain's stay (approximately 1 hour). Chaplain remains available upon request if needed.  Chaplain Therisa Samuel

## 2024-09-12 NOTE — Consult Note (Addendum)
 Consultation Note   Referring Provider:   PCCM PCP: Joshua Debby CROME, MD Primary Gastroenterologist:    None.  Has [place] PCP.    Reason for Consultation: GI bleed DOA: 09/11/2024         Hospital Day: 2   Attending physician's note  I personally saw the patient and performed a substantive portion of the medical decision making process for this encounter (including a complete performance of the key components : MDM, Hx and Exam).  I agree with the APP's note, impression, and  the management plan for the number and complexity of problems addressed at the encounter for the patient and take responsibility for that plan with its inherent risk of complications, morbidity, or mortality with additional input as follows.    89 year old female admitted with severe hypotension /shock, was found unresponsive by family unclear how long she was in that state  She is unresponsive, intubated on ventilator, on multiple pressors and bicarb drip She had CODE BLUE twice in the ICU, was resuscitated On exam abdomen soft, no distention or rebound tenderness  She is in multiorgan failure, worsening kidney function and shock liver D-dimer >20 Lactic acid >9 Low fibrinogen with elevated PT and INR  She is having dark red output through NG tube and is also having hematochezia.  CT suggestive of bowel ischemia Bleeding in the setting of ischemic gut with diffuse oozing, is not amenable to endoscopic therapy, is at risk for bowel perforation and do not recommend diagnostic EGD or colonoscopy at this point Hemoglobin is relatively stable since this morning on repeat check  Overall prognosis poor with extremely high mortality risk  Please call GI back if needed    K. Veena Srinika Delone , MD [phone]     ASSESSMENT    89 year old female admitted with syncopal episode / GI bleed / shock ( hemorrhagic +/- or septic) .  Suspect UGI bleed Presenting hemoglobin  8.4 with subsequent drop to 5.4 . She has received 2 units of RBCs, a 3rd unit transfusing. On exam she is incontinent of a small amount of dark brown stool with slight reddish tent.  There is dark red liquid in the NG tube canister  Markedly elevated liver enzymes 2/2 to ischemic hepatitis.  Coagulopathy Vitamin K , FFP administered.  Some improvement in an INR.    Dementia  Hypertension  See PMH for any additional medical history  / medical problems  Principal Problem:   Shock circulatory (HCC)   PLAN:   --Patient is on sedated on ventilator, on pressors, bicarb drip and with AKI and shock likver. She is at very high risk for endoscopic intervention.    HPI   Patient sedated and intubated .  No family in room at time of this visit .  History comes from chart   Patient brought to ED yesterday after being found unresponsive on the commode by her family. It is reported that she that she had a dark tarry stool.  Apparently she had also had some episodes of vomiting within the last couple days.  There has been some recent aspirin use .    In the ED patient was found to be confused, tachypneic, her lactic acid was markedly elevated.  Pertinent GI Studies    Labs and Imaging:  Recent Labs    09/11/24 2206 09/12/24 0747  PROT 6.8 4.7*  ALBUMIN 2.7* 1.8*  AST 55* 3,091*  ALT 54* 2,544*  ALKPHOS 71 74  BILITOT 1.1 1.5*   Recent Labs    09/11/24 2206 09/12/24 0420 09/12/24 0623 09/12/24 0729 09/12/24 0843 09/12/24 0929  WBC 22.8* 16.6*  --  17.2*  --   --   HGB 8.4* 5.4* 6.8* 8.3*  8.2* 8.2*  --   HCT 28.0* 18.5* 20.0* 26.5*  25.7* 24.0*  --   MCV 106.9* 109.5*  --  98.9  --   --   PLT 620* 361  --  323  --  300   Recent Labs    09/11/24 2206 09/12/24 0420 09/12/24 0623 09/12/24 0747 09/12/24 0843  NA 153* 157* 151* 150* 149*  K 5.6* 5.9* 5.5* 5.3* 5.1  CL 121* 117*  --  111  --   CO2 7* 8*  --  8*  --   GLUCOSE 169* 243*  --  357*  --   BUN 80* 72*   --  70*  --   CREATININE 3.17* 3.30*  --  3.14*  --   CALCIUM  9.4 8.8*  --  8.4*  --      ECHOCARDIOGRAM COMPLETE    ECHOCARDIOGRAM REPORT       Patient Name:   Leah Stafford Date of Exam: 09/12/2024 Medical Rec #:  [id number]       Height:       63.5 in Accession #:    [id number]      Weight:       123.7 lb Date of Birth:  03/05/1934        BSA:          1.586 m Patient Age:    89 years        BP:           140/49 mmHg Patient Gender: F               HR:           97 bpm. Exam Location:  Inpatient  Procedure: 2D Echo (Both Spectral and Color Flow Doppler were utilized during            procedure).  Indications:    Abn EKG   History:        Patient has no prior history of Echocardiogram examinations.                 Arrythmias:Abnormal EKG.   Sonographer:    Norleen Amour Referring Phys: [id number] ABDULLAHI HUSSEIN  IMPRESSIONS   1. Left ventricular ejection fraction, by estimation, is >75%. The left ventricle has hyperdynamic function. The left ventricle has no regional wall motion abnormalities. Left ventricular diastolic parameters are consistent with Grade I diastolic  dysfunction (impaired relaxation).  2. Right ventricular systolic function is normal. The right ventricular size is normal. Tricuspid regurgitation signal is inadequate for assessing PA pressure.  3. The mitral valve is normal in structure. No evidence of mitral valve regurgitation. No evidence of mitral stenosis.  4. The aortic valve is tricuspid. Aortic valve regurgitation is not visualized. No aortic stenosis is present.  FINDINGS  Left Ventricle: Left ventricular ejection fraction, by estimation, is >75%. The left ventricle has hyperdynamic function. The left ventricle has no regional wall motion abnormalities. The left ventricular internal cavity size was normal in size. There  is no left ventricular hypertrophy. Left ventricular diastolic parameters are consistent with Grade I diastolic dysfunction  (impaired relaxation). Normal left ventricular filling pressure.  Right Ventricle: The right ventricular size is normal. Right vetricular wall thickness was not well visualized. Right ventricular systolic function is normal. Tricuspid regurgitation signal is inadequate for assessing PA pressure.  Left Atrium: Left atrial size was normal in size.  Right Atrium: Right atrial size was normal in size.  Pericardium: There is no evidence of pericardial effusion.  Mitral Valve: The mitral valve is normal in structure. No evidence of mitral valve regurgitation. No evidence of mitral valve stenosis.  Tricuspid Valve: The tricuspid valve is normal in structure. Tricuspid valve regurgitation is trivial. No evidence of tricuspid stenosis.  Aortic Valve: The aortic valve is tricuspid. Aortic valve regurgitation is not visualized. No aortic stenosis is present. Aortic valve mean gradient measures 5.0 mmHg. Aortic valve peak gradient measures 10.4 mmHg. Aortic valve area, by VTI measures 1.66  cm.  Pulmonic Valve: The pulmonic valve was not well visualized. Pulmonic valve regurgitation is not visualized. No evidence of pulmonic stenosis.  Aorta: The aortic root and ascending aorta are structurally normal, with no evidence of dilitation.  Venous: IVC assessment for right atrial pressure unable to be performed due to mechanical ventilation.  IAS/Shunts: No atrial level shunt detected by color flow Doppler.    LEFT VENTRICLE PLAX 2D LVIDd:         3.40 cm     Diastology LVIDs:         2.60 cm     LV e' medial:    4.68 cm/s LV PW:         0.90 cm     LV E/e' medial:  10.5 LV IVS:        0.90 cm     LV e' lateral:   7.62 cm/s LVOT diam:     1.70 cm     LV E/e' lateral: 6.5 LV SV:         47 LV SV Index:   29 LVOT Area:     2.27 cm   LV Volumes (MOD) LV vol d, MOD A2C: 34.3 ml LV vol d, MOD A4C: 29.2 ml LV vol s, MOD A2C: 14.8 ml LV vol s, MOD A4C: 6.5 ml LV SV MOD A2C:     19.5 ml LV SV MOD  A4C:     29.2 ml LV SV MOD BP:      24.4 ml  RIGHT VENTRICLE             IVC RV Basal diam:  3.60 cm     IVC diam: 1.50 cm RV S prime:     18.50 cm/s TAPSE (M-mode): 1.8 cm  LEFT ATRIUM             Index        RIGHT ATRIUM           Index LA diam:        3.00 cm 1.89 cm/m   RA Area:     14.50 cm LA Vol (A2C):   34.6 ml 21.82 ml/m  RA Volume:   34.00 ml  21.44 ml/m LA Vol (A4C):   41.2 ml 25.98 ml/m LA Biplane Vol: 38.1 ml 24.03 ml/m  AORTIC VALVE                     PULMONIC VALVE AV Area (Vmax):    1.76 cm  PV Vmax:       1.19 m/s AV Area (Vmean):   2.03 cm      PV Peak grad:  5.7 mmHg AV Area (VTI):     1.66 cm AV Vmax:           161.00 cm/s AV Vmean:          102.000 cm/s AV VTI:            0.281 m AV Peak Grad:      10.4 mmHg AV Mean Grad:      5.0 mmHg LVOT Vmax:         125.00 cm/s LVOT Vmean:        91.100 cm/s LVOT VTI:          0.205 m LVOT/AV VTI ratio: 0.73   AORTA Ao Root diam: 2.50 cm Ao Asc diam:  2.20 cm  MITRAL VALVE MV Area (PHT): 2.58 cm    SHUNTS MV Decel Time: 294 msec    Systemic VTI:  0.20 m MV E velocity: 49.30 cm/s  Systemic Diam: 1.70 cm MV A velocity: 80.70 cm/s MV E/A ratio:  0.61  Dorn Ross MD Electronically signed by Dorn Ross MD Signature Date/Time: 09/12/2024/9:01:34 AM      Final   DG Abd Portable 1V CLINICAL DATA:  OG tube placement.  EXAM: PORTABLE ABDOMEN - 1 VIEW  COMPARISON:  None Available.  FINDINGS: OG tube tip is in the mid stomach with proximal side port below the expected location of the GE junction. Visualized upper abdomen demonstrates nonspecific bowel gas pattern.  IMPRESSION: OG tube tip is in the mid stomach.  Electronically Signed   By: Camellia Candle M.D.   On: 09/12/2024 06:55 DG CHEST PORT 1 VIEW EXAM: 1 VIEW(S) XRAY OF THE CHEST 09/12/2024 05:23:06 AM  COMPARISON: 09/11/2024  CLINICAL HISTORY: Encounter for intubation [id number]; [id number] Encounter for orogastric (OG)  tube placement [id number]; [id number] Encounter for central line placement [id number]. counter for intubation [id number], [id number] Encounter for orogastric (OG) tube placement  FINDINGS:  LINES, TUBES AND DEVICES: Endotracheal tube in place with tip 3.4 cm above the carina. Enteric tube in place with tip and side port overlying the expected region of the gastric lumen. Right subclavian central venous catheter in place with tip overlying the expected region of the superior cavoatrial junction.  LUNGS AND PLEURA: Interval increase in perihilar interstitial markings. No focal pulmonary opacity. No pulmonary edema. No pleural effusion. No pneumothorax.  HEART AND MEDIASTINUM: Atherosclerotic plaque noted. No acute abnormality of the cardiac and mediastinal silhouettes.  BONES AND SOFT TISSUES: No acute osseous abnormality.  IMPRESSION: 1. Interval increase in perihilar interstitial markings.  Electronically signed by: Evalene Coho MD 09/12/2024 05:28 AM EDT RP Workstation: [id number] CT Angio Chest Pulmonary Embolism (PE) W or WO Contrast EXAM: CTA CHEST 09/12/2024 02:48:41 AM  TECHNIQUE: CTA of the chest was performed without and with the administration of 75 mL of intravenous iohexol  (OMNIPAQUE ) 350 MG/ML injection. Multiplanar reformatted images are provided for review. MIP images are provided for review. Automated exposure control, iterative reconstruction, and/or weight based adjustment of the mA/kV was utilized to reduce the radiation dose to as low as reasonably achievable.  COMPARISON: None available.  CLINICAL HISTORY: Pulmonary embolism (PE) suspected, high prob. This is a 89 year old female presenting emergency department after syncopal episode. Was found unresponsive on the commode by family who noted that she was normal 10 to 15 minutes prior. Does have underlying dementia and sounds to be  back to baseline per EMS report. Patient unreliable historian, but denies any pain  and states that she feels pretty good. Denies chest pain, endorses some shortness of breath, denies abdominal pain. She is unsure why she is here.  FINDINGS:  PULMONARY ARTERIES: Pulmonary arteries are adequately opacified for evaluation. There is no evidence of pulmonary embolus. Main pulmonary artery is normal in caliber.  MEDIASTINUM: The heart and pericardium demonstrate no acute abnormality. There is mild-to-moderate atheromatous disease within the thoracic aorta. The thoracic esophagus is distended with air and fluid. Small nodules are noted within the thyroid  gland bilaterally.  LYMPH NODES: No mediastinal, hilar or axillary lymphadenopathy.  LUNGS AND PLEURA: There are few reticular opacities present within the left upper lobe and dependently within the lower lobes bilaterally. No focal consolidation or pulmonary edema. No evidence of pleural effusion or pneumothorax.  UPPER ABDOMEN: There is a mild-to-moderate sliding hiatus hernia. There is calcified stone within the gallbladder.  SOFT TISSUES AND BONES: No acute bone or soft tissue abnormality.  IMPRESSION: 1. No evidence of pulmonary embolus. 2. Mild-to-moderate atheromatous disease within the thoracic aorta. 3. Mild-to-moderate sliding hiatus hernia with distended thoracic esophagus containing air and fluid.  Electronically signed by: Evalene Coho MD 09/12/2024 04:23 AM EDT RP Workstation: [id number] CT ABDOMEN PELVIS W CONTRAST EXAM: CT ABDOMEN AND PELVIS WITH CONTRAST 09/12/2024 02:48:41 AM  TECHNIQUE: CT of the abdomen and pelvis was performed with the administration of 75 mL of iohexol  (OMNIPAQUE ) 350 MG/ML injection. Multiplanar reformatted images are provided for review. Automated exposure control, iterative reconstruction, and/or weight-based adjustment of the mA/kV was utilized to reduce the radiation dose to as low as reasonably achievable.  COMPARISON: CT abdomen and pelvis 09/11/2024,  CT angiography chest 09/12/2024.  CLINICAL HISTORY: Abdominal pain, acute (Ped 0-17y); persistent lactic acidosis>10. This is a 89 year old female presenting emergency department after syncopal episode. Was found unresponsive on the commode by family who noted that she was normal 10 to 15 minutes prior. Does have underlying dementia and sounds to be back to baseline; per EMS report. Patient unreliable historian, but denies any pain and states that she feels pretty good. Denies chest pain, endorses some shortness of breath, denies abdominal pain. She is unsure why she is here. coded.  FINDINGS:  LOWER CHEST: Please see separately dictated CT angiography chest 09/12/2024 06:25 PM.  LIVER: The liver is unremarkable.  GALLBLADDER AND BILE DUCTS: Calcified stone within the gallbladder lumen. No gallbladder wall thickening or pericholecystic fluid. No biliary ductal dilatation.  SPLEEN: No acute abnormality.  PANCREAS: No acute abnormality.  ADRENAL GLANDS: No acute abnormality.  KIDNEYS, URETERS AND BLADDER: Bilateral kidneys are atrophic with renal parenchymal scarring. Fluid-dense lesions within the kidneys likely represent simple renal cysts. Simple renal cysts do not require additional follow-up unless clinically indicated due to signs/symptoms. No stones in the kidneys or ureters. No hydronephrosis. No perinephric or periureteral stranding. Urinary bladder is unremarkable.  GI AND BOWEL: Partially visualized at least moderate volume hiatal hernia. Diffuse small bowel fluid distention with no definite dilatation or transition point. No pneumatosis. No small or large bowel wall thickening. No large bowel dilatation. No portal venous or mesenteric venous gas. No mesenteric edema.  PERITONEUM AND RETROPERITONEUM: No ascites. No free air.  VASCULATURE: No abdominal aorta aneurysm or dissection. Moderate atherosclerotic plaque.  LYMPH NODES: No  lymphadenopathy.  REPRODUCTIVE ORGANS: Uterus grossly unremarkable with no adnexal mass.  BONES AND SOFT TISSUES: Multilevel degenerative changes of the spine with chronic T11 superior endplate fracture  and posterior disc osteophyte complex formation at the L5-S1 level. No severe osseous neural foraminal or central canal stenosis. No focal soft tissue abnormality.  IMPRESSION: 1. No acute findings in the abdomen or pelvis. 2. Calcified gallstone without evidence of acute cholecystitis. 3. At least moderate hiatal hernia, partially visualized. 4. Diffuse small-bowel fluid distention without findings to suggest bowel ischemia, obstruction, or perforation. 5. Please see separately dictated CT angiography chest 09/12/2024 06:25 PM - recommend enteric tube in the setting of esophageal dilatation with fluid - PRA call report. These findings were discussed over the phone with doctor [facility] by Dr. Margarite on 09/12/24 at 3:41am.  Electronically signed by: Kate Margarite MD 09/12/2024 04:02 AM EDT RP Workstation: [id number] CT ABDOMEN PELVIS WO CONTRAST EXAM: CT ABDOMEN AND PELVIS WITHOUT CONTRAST 09/11/2024 11:59:06 PM  TECHNIQUE: CT of the abdomen and pelvis was performed without the administration of intravenous contrast. Multiplanar reformatted images are provided for review. Automated exposure control, iterative reconstruction, and/or weight-based adjustment of the mA/kV was utilized to reduce the radiation dose to as low as reasonably achievable.  COMPARISON: None available.  CLINICAL HISTORY: Sepsis.  FINDINGS:  LOWER CHEST: Moderate hiatal hernia.  LIVER: The liver is unremarkable.  GALLBLADDER AND BILE DUCTS: Cholelithiasis. Distended gallbladder. No wall thickening or pericholecystic fluid. No biliary ductal dilatation.  SPLEEN: No acute abnormality.  PANCREAS: No acute abnormality.  ADRENAL GLANDS: No acute abnormality.  KIDNEYS, URETERS AND  BLADDER: No stones in the kidneys or ureters. No hydronephrosis. No perinephric or periureteral stranding. Urinary bladder is unremarkable.  GI AND BOWEL: Stomach demonstrates no acute abnormality. Fluid-filled small bowel at the upper limits of normal in caliber without discrete transition point. Question mild wall thickening. Evaluation is limited without IV contrast. Correlate for signs and symptoms of enteritis. There is no bowel obstruction. Normal appendix.  PERITONEUM AND RETROPERITONEUM: No ascites. No free air.  VASCULATURE: Aorta is normal in caliber. Aortic atherosclerotic calcification.  LYMPH NODES: No lymphadenopathy.  REPRODUCTIVE ORGANS: Pessary.  BONES AND SOFT TISSUES: No acute osseous abnormality. No focal soft tissue abnormality.  IMPRESSION: 1. Fluid-filled small bowel at the upper limits of normal in caliber without discrete transition point. Possible mild wall thickening. Evaluation is limited without IV contrast. Correlate for signs and symptoms of enteritis. 2. Moderate hiatal hernia.  Electronically signed by: Norman Gatlin MD 09/12/2024 12:20 AM EDT RP Workstation: [id number]   Past Medical History:  Diagnosis Date   Arthritis    Cataract    Depression    Diabetes mellitus without complication (HCC)    Diet and excercise   Diabetic retinopathy (HCC)    High cholesterol    Hypertension     Past Surgical History:  Procedure Laterality Date   EYE SURGERY     TUBAL LIGATION      Family History  Problem Relation Age of Onset   Cancer Father    Hyperlipidemia Sister    Hypertension Sister    Hypertension Brother    Hyperlipidemia Brother    Heart disease Brother     Prior to Admission medications   Medication Sig Start Date End Date Taking? Authorizing Provider  acetaminophen  (TYLENOL ) 500 MG tablet Take 2 tablets (1,000 mg total) every 8 (eight) hours as needed by mouth for moderate pain. 10/07/17  Yes Stallings, Zoe A, MD   COLOSTRUM PO Take 1 capsule by mouth in the morning.   Yes [provider]  conjugated estrogens (PREMARIN) vaginal cream Place pea-sized amount of cream to the vagina  every night for 2 weeks and then 2 times a week thereafter. 09/09/19  Yes [provider]  hydrALAZINE  (APRESOLINE ) 100 MG tablet Take 1 tablet (100 mg total) by mouth 3 (three) times daily. 12/04/23  Yes Chandrasekhar, Mahesh A, MD  lidocaine  (LIDODERM ) 5 % Place 1 patch onto the skin daily. Remove & Discard patch within 12 hours or as directed by MD   Yes [provider]  Misc Natural Products (COLON CLEANSE) CAPS Take 1 capsule by mouth every evening.   Yes [provider]  Multiple Vitamin (MULTIVITAMIN WITH MINERALS) TABS tablet Take 1 tablet by mouth daily.   Yes [provider]  Nutritional Supplements (BEE POLLEN/ROYAL JELLY/HONEY PO) Take 1 capsule by mouth in the morning.   Yes [provider]  potassium chloride  (KLOR-CON  10) 10 MEQ tablet Take 1 tablet (10 mEq total) by mouth 2 (two) times daily. 08/14/24  Yes Joshua Debby CROME, MD    Current Facility-Administered Medications  Medication Dose Route Frequency Provider Last Rate Last Admin   0.9 %  sodium chloride infusion (Manually program via Guardrails IV Fluids)   Intravenous Once Hussein, Abdullahi, MD   Held at 09/12/24 0537   0.9 %  sodium chloride infusion (Manually program via Guardrails IV Fluids)   Intravenous Once Hussein, Abdullahi, MD   Held at 09/12/24 9391   0.9 %  sodium chloride infusion (Manually program via Guardrails IV Fluids)   Intravenous Once Baral, Dipti, MD       acetaminophen  (TYLENOL ) tablet 650 mg  650 mg Oral Q6H PRN Sharie Bourbon, MD       ceFEPIme (MAXIPIME) 1 g in sodium chloride 0.9 % 100 mL IVPB  1 g Intravenous Q24H Baral, Dipti, MD       Chlorhexidine Gluconate Cloth 2 % PADS 6 each  6 each Topical Daily Hussein, Abdullahi, MD       dextrose 50 % solution 12.5 g  12.5 g Intravenous  STAT Hussein, Bourbon, MD       docusate sodium (COLACE) capsule 100 mg  100 mg Oral BID PRN Hussein, Abdullahi, MD       EPINEPHrine (ADRENALIN) 1 MG/ML            EPINEPhrine 10 mcg/mL Adult IV Push Syringe (For Blood Pressure Support)  5-20 mcg Intravenous Once PRN Ogan, Okoronkwo U, MD       fentaNYL 2500mcg in NS 250mL (10mcg/ml) infusion-PREMIX  0-400 mcg/hr Intravenous Continuous Sharie Bourbon, MD 10 mL/hr at 09/12/24 1014 100 mcg/hr at 09/12/24 1014   hydrocortisone sodium succinate (SOLU-CORTEF) 100 MG injection 100 mg  100 mg Intravenous BID Sharie Bourbon, MD   100 mg at 09/12/24 9395   metroNIDAZOLE (FLAGYL) IVPB 500 mg  500 mg Intravenous Q12H Baral, Dipti, MD       mupirocin ointment (BACTROBAN) 2 % 1 Application  1 Application Nasal BID Hussein, Abdullahi, MD       norepinephrine (LEVOPHED) 16 mg in 250mL (0.064 mg/mL) premix infusion  0-40 mcg/min Intravenous Titrated Sharie Bourbon, MD 8.44 mL/hr at 09/12/24 1014 9 mcg/min at 09/12/24 1014   octreotide (SANDOSTATIN) 500 mcg in sodium chloride 0.9 % 250 mL (2 mcg/mL) infusion  50 mcg/hr Intravenous Continuous Baral, Dipti, MD 25 mL/hr at 09/12/24 1014 50 mcg/hr at 09/12/24 1014   Oral care mouth rinse  15 mL Mouth Rinse Q2H Sharie Bourbon, MD   15 mL at 09/12/24 0943   Oral care mouth rinse  15 mL Mouth Rinse PRN Hussein,  Lenny, MD       pantoprazole (PROTONIX) injection 40 mg  40 mg Intravenous Q12H Mitchell, Madeline I, RPH   40 mg at 09/12/24 0511   phytonadione (VITAMIN K) 5 mg in dextrose 5 % 50 mL IVPB  5 mg Intravenous STAT Baral, Dipti, MD 50.5 mL/hr at 09/12/24 1034 5 mg at 09/12/24 1034   polyethylene glycol (MIRALAX / GLYCOLAX) packet 17 g  17 g Oral Daily PRN Sharie Lenny, MD       propofol (DIPRIVAN) 1000 MG/100ML infusion  0-80 mcg/kg/min (Order-Specific) Intravenous Titrated Sharie Lenny, MD 9.45 mL/hr at 09/12/24 1014 25 mcg/kg/min at 09/12/24 1014   sodium bicarbonate 150 mEq in  dextrose 5 % 1,150 mL infusion   Intravenous Continuous Sharie Lenny, MD 125 mL/hr at 09/12/24 1014 Infusion Verify at 09/12/24 1014   thiamine (VITAMIN B1) 200 mg in sodium chloride 0.9 % 50 mL IVPB  200 mg Intravenous BID Sharie Lenny, MD   Stopped at 09/12/24 1007   [START ON 09/13/2024] vancomycin (VANCOREADY) IVPB 750 mg/150 mL  750 mg Intravenous Q48H Hussein, Lenny, MD       vasopressin (PITRESSIN) 20 Units in 100 mL (0.2 unit/mL) infusion-*FOR SHOCK*  0-0.03 Units/min Intravenous Continuous Sharie Lenny, MD 9 mL/hr at 09/12/24 1014 0.03 Units/min at 09/12/24 1014    Allergies as of 09/11/2024 - Review Complete 09/11/2024  Allergen Reaction Noted   Codeine Nausea And Vomiting and Other (See Comments) 03/30/2014    Social History   Socioeconomic History   Marital status: Widowed    Spouse name: Not on file   Number of children: 3   Years of education: Not on file   Highest education level: Not on file  Occupational History   Occupation: retired  Tobacco Use   Smoking status: Never   Smokeless tobacco: Former    Types: Snuff  Vaping Use   Vaping status: Never Used  Substance and Sexual Activity   Alcohol use: No   Drug use: No   Sexual activity: Not Currently  Other Topics Concern   Not on file  Social History Narrative   Marital status: widowed; not dating in 2018      Children: 3 children; 9 grandchildren; 8 gg.      Lives: alone; daughter five minutes per day.      Employment: retired.       Tobacco: none      Alcohol: none      Exercise: previous walking; no walking during winter months.      ADLs: no driving.  Cleans, washes clothes; cleans house; pays bills.  Grocery shopping with daughter.  No assistant devices in 2018.       Advanced Directives:  None;  FULL CODE; daughter HCPOA.         Social Drivers of Corporate investment banker Strain: Low Risk  (07/21/2024)   Overall Financial Resource Strain (CARDIA)    Difficulty of Paying  Living Expenses: Not hard at all  Food Insecurity: No Food Insecurity (07/21/2024)   Hunger Vital Sign    Worried About Running Out of Food in the Last Year: Never true    Ran Out of Food in the Last Year: Never true  Transportation Needs: No Transportation Needs (07/21/2024)   PRAPARE - Administrator, Civil Service (Medical): No    Lack of Transportation (Non-Medical): No  Physical Activity: Insufficiently Active (07/21/2024)   Exercise Vital Sign    Days of Exercise per Week:  7 days    Minutes of Exercise per Session: 20 min  Stress: No Stress Concern Present (07/21/2024)   [employer] of Occupational Health - Occupational Stress Questionnaire    Feeling of Stress: Not at all  Social Connections: Moderately Integrated (07/21/2024)   Social Connection and Isolation Panel    Frequency of Communication with Friends and Family: More than three times a week    Frequency of Social Gatherings with Friends and Family: More than three times a week    Attends Religious Services: More than 4 times per year    Active Member of [employer] or Organizations: Yes    Attends Banker Meetings: More than 4 times per year    Marital Status: Widowed  Intimate Partner Violence: Not At Risk (07/21/2024)   Humiliation, Afraid, Rape, and Kick questionnaire    Fear of Current or Ex-Partner: No    Emotionally Abused: No    Physically Abused: No    Sexually Abused: No     Code Status   Code Status: Full Code  Review of Systems: All systems reviewed and negative except where noted in HPI.  Physical Exam: Vital signs in last 24 hours: Temp:  [87.4 F (30.8 C)-97.4 F (36.3 C)] 93.9 F (34.4 C) (10/18 1030) Pulse Rate:  [71-122] 92 (10/18 1030) Resp:  [12-40] 16 (10/18 1030) BP: (78-204)/(37-118) 168/65 (10/18 1012) SpO2:  [93 %-100 %] 100 % (10/18 1030) Arterial Line BP: (84-189)/(38-98) 122/56 (10/18 1030) FiO2 (%):  [60 %-100 %] 60 % (10/18 0839) Weight:  [56.1 kg] 56.1  kg (10/18 0500) Last BM Date : 09/11/24  General:  female partially sedated / intubated on ventilator.  Nose: No deformity, discharge or lesions Neck:  Supple, no masses felt Lungs:  Clear to auscultation.  Heart:  Regular rate, irregular rhythm.  Abdomen:  Soft, nondistended,  active bowel sounds, no masses felt Rectal :  Deferred Msk: Symmetrical without gross deformities.  Neurologic:  Partially opens eyes with deep palpation of abdomen.  Skin:  Intact without significant lesions.    Intake/Output from previous day: 10/17 0701 - 10/18 0700 In: 1543 [I.V.:1260.5; Blood:282.5] Out: 160 [Urine:10; Emesis/NG output:150] Intake/Output this shift:  Total I/O In: 2413.7 [I.V.:826.2; Blood:346.7; IV Piggyback:1240.8] Out: -    Vina Dasen, NP-C   09/12/2024, 10:44 AM

## 2024-09-12 NOTE — Progress Notes (Signed)
 Patient went into cardiac arrest twice, each lasting approximately 4 minutes, initial rhythm asystole, total of 4 epi, 3 bicarb and 4 rounds of CPR. ROSC achieved at 3:24 AM.  Patient intubated emergently and put on vent support Patient following commands  On mechanical ventilation-on proper fentanyl On levo 30-right subclavian Central line, right axillary A-line placed Repeat HD 5.6-2 PRBC ordered Repeat lactic acid 9 INR 4.1-FFP ordered POCUS-showed pericardial effusion, but no RV diastolic collapse or obvious tamponade, no tension pneumo CT with contrast no acute findings in the abdomen or pelvis, no ischemia obstruction or perforation or abscess.  No PE ABG showed 6.07/26/23-this was after pushes of bicarb Severe metabolic acidosis is likely etiology of cardiac arrest Patient is put on bicarb gtt.  Plans - Follow-up labs - Follow-up with GI for scope - Transfuse 2 PRBC, and 1 FFP with serial H&H - Hydrocortisone, fludrocortisone added - Continue empiric antibiotics - MAP goal of> 65 - Continue Protonix - Continue monitoring electrolytes, and trending lactic acid  Roxas Clymer,MD CCM   Critical care time: 40 mins

## 2024-09-12 NOTE — Progress Notes (Signed)
 Responded to CODE BLUE. Pt has PIV access and unit staff was able to establish additional PIV access. No further assistance needed at this time.

## 2024-09-13 DIAGNOSIS — E46 Unspecified protein-calorie malnutrition: Secondary | ICD-10-CM

## 2024-09-13 DIAGNOSIS — E8721 Acute metabolic acidosis: Secondary | ICD-10-CM

## 2024-09-13 DIAGNOSIS — I214 Non-ST elevation (NSTEMI) myocardial infarction: Secondary | ICD-10-CM

## 2024-09-13 DIAGNOSIS — A419 Sepsis, unspecified organism: Secondary | ICD-10-CM

## 2024-09-13 DIAGNOSIS — R739 Hyperglycemia, unspecified: Secondary | ICD-10-CM

## 2024-09-13 DIAGNOSIS — E875 Hyperkalemia: Secondary | ICD-10-CM

## 2024-09-13 LAB — BPAM RBC
Blood Product Expiration Date: 202510202359
Blood Product Expiration Date: 202510212359
Blood Product Expiration Date: 202510252359
Blood Product Expiration Date: 202511022359
ISSUE DATE / TIME: 202510180507
ISSUE DATE / TIME: 202510180507
ISSUE DATE / TIME: 202510180950
ISSUE DATE / TIME: 202510180950
Unit Type and Rh: 5100
Unit Type and Rh: 5100
Unit Type and Rh: 5100
Unit Type and Rh: 5100

## 2024-09-13 LAB — TYPE AND SCREEN
ABO/RH(D): O POS
Antibody Screen: NEGATIVE
Unit division: 0
Unit division: 0
Unit division: 0
Unit division: 0

## 2024-09-13 LAB — COMPREHENSIVE METABOLIC PANEL WITH GFR
ALT: 2371 U/L — ABNORMAL HIGH (ref 0–44)
AST: 4948 U/L — ABNORMAL HIGH (ref 15–41)
Albumin: 2.1 g/dL — ABNORMAL LOW (ref 3.5–5.0)
Alkaline Phosphatase: 76 U/L (ref 38–126)
Anion gap: 20 — ABNORMAL HIGH (ref 5–15)
BUN: 73 mg/dL — ABNORMAL HIGH (ref 8–23)
CO2: 21 mmol/L — ABNORMAL LOW (ref 22–32)
Calcium: 7.2 mg/dL — ABNORMAL LOW (ref 8.9–10.3)
Chloride: 105 mmol/L (ref 98–111)
Creatinine, Ser: 3.54 mg/dL — ABNORMAL HIGH (ref 0.44–1.00)
GFR, Estimated: 12 mL/min — ABNORMAL LOW (ref >60)
Glucose, Bld: 268 mg/dL — ABNORMAL HIGH (ref 70–99)
Potassium: 5.4 mmol/L — ABNORMAL HIGH (ref 3.5–5.1)
Sodium: 146 mmol/L — ABNORMAL HIGH (ref 135–145)
Total Bilirubin: 3.9 mg/dL — ABNORMAL HIGH (ref 0.0–1.2)
Total Protein: 4.9 g/dL — ABNORMAL LOW (ref 6.5–8.1)

## 2024-09-13 LAB — TRIGLYCERIDES: Triglycerides: 323 mg/dL — ABNORMAL HIGH (ref <150)

## 2024-09-13 LAB — GLUCOSE, CAPILLARY
Glucose-Capillary: 266 mg/dL — ABNORMAL HIGH (ref 70–99)
Glucose-Capillary: 173 mg/dL — ABNORMAL HIGH (ref 70–99)

## 2024-09-13 LAB — PREPARE FRESH FROZEN PLASMA

## 2024-09-13 LAB — BPAM FFP
Blood Product Expiration Date: 202510192359
Blood Product Expiration Date: 202510192359
ISSUE DATE / TIME: 202510180950
ISSUE DATE / TIME: 202510180950
Unit Type and Rh: 6200
Unit Type and Rh: 6200

## 2024-09-13 MED ORDER — POLYVINYL ALCOHOL 1.4 % OP SOLN: 1.0000 [drp] | Freq: Four times a day (QID) | OPHTHALMIC | Status: DC | PRN

## 2024-09-13 MED ORDER — GLYCOPYRROLATE 0.2 MG/ML IJ SOLN: 0.2000 mg | INTRAMUSCULAR | Status: DC | PRN

## 2024-09-13 MED ORDER — MIDAZOLAM HCL (PF) 2 MG/2ML IJ SOLN: 2.0000 mg | INTRAMUSCULAR | Status: DC | PRN

## 2024-09-13 MED ORDER — ACETAMINOPHEN 650 MG RE SUPP: 650.0000 mg | Freq: Four times a day (QID) | RECTAL | Status: DC | PRN

## 2024-09-13 MED ORDER — GLYCOPYRROLATE 1 MG PO TABS: 1.0000 mg | ORAL_TABLET | ORAL | Status: DC | PRN

## 2024-09-13 MED ORDER — SODIUM CHLORIDE 0.9 % IV SOLN: INTRAVENOUS | Status: DC

## 2024-09-13 MED ORDER — SODIUM ZIRCONIUM CYCLOSILICATE 10 G PO PACK
10.0000 g | PACK | Freq: Once | ORAL | Status: AC
  Administered 2024-09-13: 10 g
  Filled 2024-09-13: qty 1

## 2024-09-13 MED ORDER — FENTANYL 2500MCG IN NS 250ML (10MCG/ML) PREMIX INFUSION
0.0000 ug/h | INTRAVENOUS | Status: DC
  Administered 2024-09-13 – 2024-09-14 (×2): 100 ug/h via INTRAVENOUS
  Filled 2024-09-13: qty 250

## 2024-09-13 MED ORDER — ACETAMINOPHEN 325 MG PO TABS: 650.0000 mg | ORAL_TABLET | Freq: Four times a day (QID) | ORAL | Status: DC | PRN

## 2024-09-13 MED ORDER — FENTANYL BOLUS VIA INFUSION: 100.0000 ug | INTRAVENOUS | Status: DC | PRN

## 2024-09-13 NOTE — Progress Notes (Signed)
 Patient terminally extubated per MD order. No complications. Family and RN and bedside. RT will continue to monitor.

## 2024-09-13 NOTE — Progress Notes (Signed)
 NAME:  Leah Stafford, MRN:  [id number], DOB:  1934/07/26, LOS: 1 ADMISSION DATE:  09/11/2024, CONSULTATION DATE: 09/12/2024 REFERRING MD: ED provider, CHIEF COMPLAINT: Syncope  History of Present Illness:  89 year old female with history of HTN, presents to the ED with syncope episode.  Patient was found to be unresponsive on the commode by family.  It is reported she had a dark tarry stool.  Family denies any anticoagulation, but reports required aspirin for pain PRN more than a week ago.  There was also 2 episodes of vomiting for the last 2 days, nonbloody/nonbilious. At the ED patient was found to be confused, protecting airway, tachypneic, wide pulse pressure 129/38, with lactic acid 9.7 that is worsening to 13.6 Critical care team was consulted for persisting lactic acidosis  Pertinent  Medical History  HTN  Significant Hospital Events: Including procedures, antibiotic start and stop dates in addition to other pertinent events   Cardiac arrest 10/18 early morning twice, each lasting about 4 minutes requiring emergent intubation 10/18- family decided DNR after am rounds   Interim History / Subjective:  Patient is intubated sedated On Levophed at 8 and vasopressin 0.04 Had bloody BM this am again Anuric Liver enzymes and kidney function worsening 1 blood culture growing staph  Objective    Blood pressure 126/71, pulse 98, temperature 99 F (37.2 C), resp. rate 20, height 5' 3.5 (1.613 m), weight 56.3 kg, SpO2 100%.    Vent Mode: PRVC FiO2 (%):  [40 %-60 %] 40 % Set Rate:  [20 bmp] 20 bmp Vt Set:  [430 mL] 430 mL PEEP:  [5 cmH20] 5 cmH20 Plateau Pressure:  [14 cmH20-18 cmH20] 18 cmH20   Intake/Output Summary (Last 24 hours) at 09/13/2024 1018 Last data filed at 09/13/2024 0600 Gross per 24 hour  Intake 4622.63 ml  Output 275 ml  Net 4347.63 ml   Filed Weights   09/12/24 0500 09/13/24 0425  Weight: 56.1 kg 56.3 kg    Examination: General: Intubated, on  mechanical ventilation HENT: NCAT, pale conjunctiva, dry mucous membrane Lungs: Dyspneic, symmetric chest wall movement, clear auscultation Cardiovascular: Tachycardia, regular rate and rhythm Abdomen: Soft nontender nondistended Extremities: Intact sensory/motor Neuro: Unable to assess   Resolved problem list   Assessment and Plan  89 year old female with history of HTN, presents with syncope episode, and dark tarry stools, and found to be in shock, with persisting lactic acidosis.  As per family, she had been sick for 2 days and had poor oral intake   Septic and hemorrhagic shock Acute metabolic encephalopathy On mechanical ventilation Acute GI bleed Acute liver failure/coagulopathy /DIC NSTEMI type II AKI on CKD Bowel ischemia secondary to shock Acute metabolic acidosis. Hyperglycemia Hyperkalemia from AKI Protein malnutrition  Plan - 1 blood culture growing staph. Likely had septic shock followed by DIC/liver failure and GI bleed from coagulopathy- both UGI and lower GI bleeding - 4 U PRBC and 2 U FFP and vit K yesterday -continue octreotide drip.  PPI IV twice daily -off bicarb drip -Echocardiogram reviewed, rules out cardiac causes of shock -  sliding scale insulin - continue to trend H/H for now   Will discuss with family when they are at bedside today Tentative comfort care plans today    Leah Navarrete,MD  [facility]   My critical care time: 30 minutes  Critical care time was exclusive of separately billable procedures and treating other patients.  Critical care was necessary to treat or prevent imminent or life-threatening deterioration.  Critical  care was time spent personally by me on the following activities: development of treatment plan with patient and/or surrogate as well as nursing, discussions with consultants, evaluation of patient's response to treatment, examination of patient, obtaining history from patient or surrogate,  ordering and performing treatments and interventions, ordering and review of laboratory studies, ordering and review of radiographic studies, pulse oximetry, re-evaluation of patient's condition and participation in multidisciplinary rounds.   09/13/2024, 10:18 AM   Labs   CBC: Recent Labs  Lab 09/11/24 2206 09/12/24 0420 09/12/24 0623 09/12/24 0729 09/12/24 0843 09/12/24 0929 09/12/24 1135 09/12/24 1328 09/12/24 1820  WBC 22.8* 16.6*  --  17.2*  --   --   --  13.4* 17.7*  NEUTROABS 20.3*  --   --   --   --   --   --   --   --   HGB 8.4* 5.4*   < > 8.3*  8.2* 8.2*  --  8.2* 9.1* 11.6*  HCT 28.0* 18.5*   < > 26.5*  25.7* 24.0*  --  24.0* 27.5* 34.1*  MCV 106.9* 109.5*  --  98.9  --   --   --  88.7 85.0  PLT 620* 361  --  323  --  300  --  246 211   < > = values in this interval not displayed.    Basic Metabolic Panel: Recent Labs  Lab 09/11/24 2206 09/12/24 0420 09/12/24 0623 09/12/24 0747 09/12/24 0843 09/12/24 1135 09/13/24 0324  NA 153* 157* 151* 150* 149* 149* 146*  K 5.6* 5.9* 5.5* 5.3* 5.1 4.8 5.4*  CL 121* 117*  --  111  --   --  105  CO2 7* 8*  --  8*  --   --  21*  GLUCOSE 169* 243*  --  357*  --   --  268*  BUN 80* 72*  --  70*  --   --  73*  CREATININE 3.17* 3.30*  --  3.14*  --   --  3.54*  CALCIUM  9.4 8.8*  --  8.4*  --   --  7.2*  MG  --  2.3  --   --   --   --   --   PHOS  --  10.6*  --   --   --   --   --    GFR: Estimated Creatinine Clearance: 9.3 mL/min (A) (by C-G formula based on SCr of 3.54 mg/dL (H)). Recent Labs  Lab 09/11/24 2213 09/12/24 0006 09/12/24 0420 09/12/24 0729 09/12/24 1328 09/12/24 1820  PROCALCITON  --   --  1.12  --   --   --   WBC  --   --  16.6* 17.2* 13.4* 17.7*  LATICACIDVEN 9.7* 13.6* >9.0* >9.0*  --   --     Liver Function Tests: Recent Labs  Lab 09/11/24 2206 09/12/24 0747 09/13/24 0324  AST 55* 3,091* 4,948*  ALT 54* 2,544* 2,371*  ALKPHOS 71 74 76  BILITOT 1.1 1.5* 3.9*  PROT 6.8 4.7* 4.9*   ALBUMIN 2.7* 1.8* 2.1*   No results for input(s): LIPASE, AMYLASE in the last 168 hours. No results for input(s): AMMONIA in the last 168 hours.  ABG    Component Value Date/Time   PHART 7.348 (L) 09/12/2024 1135   PCO2ART 27.3 (L) 09/12/2024 1135   PO2ART 235 (H) 09/12/2024 1135   HCO3 15.4 (L) 09/12/2024 1135   TCO2 16 (L) 09/12/2024 1135   ACIDBASEDEF  10.0 (H) 09/12/2024 1135   O2SAT 100 09/12/2024 1135     Coagulation Profile: Recent Labs  Lab 09/12/24 0420 09/12/24 0929  INR 4.1* 3.5*    Cardiac Enzymes: Recent Labs  Lab 09/12/24 1328  CKTOTAL 591*    HbA1C: Hgb A1c MFr Bld  Date/Time Value Ref Range Status  07/21/2024 03:56 PM 5.8 4.6 - 6.5 % Final    Comment:    Glycemic Control Guidelines for People with Diabetes:Non Diabetic:  <6%Goal of Therapy: <7%Additional Action Suggested:  >8%   01/29/2023 02:08 PM 5.5 4.6 - 6.5 % Final    Comment:    Glycemic Control Guidelines for People with Diabetes:Non Diabetic:  <6%Goal of Therapy: <7%Additional Action Suggested:  >8%     CBG: Recent Labs  Lab 09/12/24 1824 09/12/24 1936 09/12/24 2346 09/13/24 0328 09/13/24 1008  GLUCAP 323* 305* 267* 266* 173*    Review of Systems:   Negative except above  Past Medical History:  She,  has a past medical history of Arthritis, Cataract, Depression, Diabetes mellitus without complication (HCC), Diabetic retinopathy (HCC), High cholesterol, and Hypertension.   Surgical History:   Past Surgical History:  Procedure Laterality Date   EYE SURGERY     TUBAL LIGATION       Social History:   reports that she has never smoked. She has quit using smokeless tobacco.  Her smokeless tobacco use included snuff. She reports that she does not drink alcohol and does not use drugs.   Family History:  Her family history includes Cancer in her father; Heart disease in her brother; Hyperlipidemia in her brother and sister; Hypertension in her brother and sister.    Allergies Allergies  Allergen Reactions   Codeine Itching, Nausea And Vomiting and Other (See Comments)    Too sleepy     Home Medications  Prior to Admission medications   Medication Sig Start Date End Date Taking? Authorizing Provider  acetaminophen  (TYLENOL ) 500 MG tablet Take 2 tablets (1,000 mg total) every 8 (eight) hours as needed by mouth for moderate pain. 10/07/17   Stallings, Zoe A, MD  ammonium lactate  (LAC-HYDRIN ) 12 % lotion Apply 1 Application topically as needed for dry skin. 10/16/23   Loreda Hacker, DPM  Cholecalciferol (VITAMIN D PO) Take 5,000 Units by mouth daily. OTC, strength not known     [provider]  conjugated estrogens (PREMARIN) vaginal cream Place pea-sized amount of cream to the vagina every night for 2 weeks and then 2 times a week thereafter. 09/09/19   [provider]  Elastic Bandages & Supports (BACK SUPPORT) MISC as needed (pain). 11/11/17   [provider]  hydrALAZINE  (APRESOLINE ) 100 MG tablet Take 1 tablet (100 mg total) by mouth 3 (three) times daily. 12/04/23   Santo Stanly LABOR, MD  Incontinence Supply Disposable (ASSURANCE UNDERWEAR WOMENS) MISC daily. 11/11/17   [provider]  Multiple Vitamin (MULTIVITAMIN WITH MINERALS) TABS tablet Take 1 tablet by mouth daily.    [provider]  oxyCODONE  (OXY IR/ROXICODONE ) 5 MG immediate release tablet Take 1 tablet (5 mg total) by mouth every 8 (eight) hours as needed for severe pain (pain score 7-10). 09/05/24   Joshua Debby CROME, MD  potassium chloride  (KLOR-CON  10) 10 MEQ tablet Take 1 tablet (10 mEq total) by mouth 2 (two) times daily. 08/14/24   Joshua Debby CROME, MD  rosuvastatin  (CRESTOR ) 5 MG tablet Take 1 tablet (5 mg total) by mouth daily. 10/08/23   Santo Stanly LABOR, MD  Critical care time: 80 mins

## 2024-09-14 ENCOUNTER — Ambulatory Visit: Admitting: Internal Medicine

## 2024-09-14 LAB — GLUCOSE, CAPILLARY
Glucose-Capillary: 79 mg/dL (ref 70–99)
Glucose-Capillary: 68 mg/dL — ABNORMAL LOW (ref 70–99)
Glucose-Capillary: 166 mg/dL — ABNORMAL HIGH (ref 70–99)
Glucose-Capillary: 31 mg/dL — CL (ref 70–99)

## 2024-09-15 LAB — LEGIONELLA PNEUMOPHILA SEROGP 1 UR AG: L. pneumophila Serogp 1 Ur Ag: NEGATIVE

## 2024-09-15 LAB — CULTURE, BLOOD (ROUTINE X 2): Special Requests: ADEQUATE

## 2024-09-16 LAB — CULTURE, BLOOD (ROUTINE X 2)
Culture: NO GROWTH
Special Requests: ADEQUATE

## 2024-09-24 ENCOUNTER — Other Ambulatory Visit (HOSPITAL_COMMUNITY): Payer: Self-pay

## 2024-09-26 NOTE — Discharge Summary (Incomplete)
 Physician Discharge Summary  Patient ID: Leah Stafford MRN: [id number] DOB/AGE: [birth year] 89 y.o.  Admit date: 09/11/2024 Discharge date: 09/13/2024  Admission Diagnoses: Septic and hemorrhagic shock  Diagnoses at time of discharge:  Principal Problem:   Shock , septic and hemorrhagic  Cardiac arrest secondary diagnosis Acute metabolic encephalopathy On mechanical ventilation Acute GI bleed Acute liver failure/coagulopathy /DIC NSTEMI type II AKI on CKD Bowel ischemia secondary to shock Acute metabolic acidosis. Hyperglycemia Hyperkalemia from AKI Protein malnutrition   Patient Details:    [name] is an 89 y.o. female. with history of HTN, presents with syncope episode, and dark tarry stools, and found to be in shock, with persisting lactic acidosis. As per family, she had been sick for 2 days and had poor oral intake   Hospital Course:  Patient was aggressively resuscitated in the ICU.  On the night of admission, she had 2 cardiac arrests lasting about 4 minutes each.  Patient was intubated during arrest. labs revealed acute liver failure, significant coagulopathy, persistent lactic acidosis. GI was consulted.  Given both upper and lower GI bleeding and concern for coagulopathy as being the cause, GI interventions were not offered.  Patient continued to have large bloody/continuous bowel movement.  I discussed with the family on the phone and later at bedside.  They decided to make patient DNR.  They wanted to continue current level of care till tomorrow so that rest of the family could pay visit to her. Next day her blood cultures came back positive for staph.  Patient likely had sepsis from staph bacteremia and developed shock liver/liver failure leading to coagulopathy and subsequent GI bleed.  Patient became anuric and her kidney and liver enzymes continued to worsen. Updated family again. At this time, family requested for comfort measures only.  Patient was  terminally extubated and passed away the next day.    Discharge Exam: Not breathing No heart rate unresponsive  Patient Vitals for the past 3 hrs:  BP Temp Pulse Resp SpO2  09/13/24 1105 (!) 155/51 -- 68 -- --  09/13/24 0915 -- 99 F (37.2 C) 98 20 100 %  09/13/24 0900 126/71 98.6 F (37 C) 98 20 100 %  09/13/24 0845 -- 99.1 F (37.3 C) 70 17 100 %  09/13/24 0830 -- 99.1 F (37.3 C) 84 20 100 %  ]   Labs at time of transfer Lab Results  Component Value Date   CREATININE 3.54 (H) 09/13/2024   BUN 73 (H) 09/13/2024   NA 146 (H) 09/13/2024   K 5.4 (H) 09/13/2024   CL 105 09/13/2024   CO2 21 (L) 09/13/2024   Lab Results  Component Value Date   WBC 17.7 (H) 09/12/2024   HGB 11.6 (L) 09/12/2024   HCT 34.1 (L) 09/12/2024   MCV 85.0 09/12/2024   PLT 211 09/12/2024   Lab Results  Component Value Date   ALT 2,371 (H) 09/13/2024   AST 4,948 (H) 09/13/2024   ALKPHOS 76 09/13/2024   BILITOT 3.9 (H) 09/13/2024   Lab Results  Component Value Date   INR 3.5 (H) 09/12/2024   INR 4.1 (HH) 09/12/2024    Current radiology studies at time of transfer No results found.  Disposition: morgue    Discharged Condition: passed away   Signed: Genavie Boettger 09/13/2024, 11:23 AM

## 2024-09-26 NOTE — Progress Notes (Signed)
 AKI on CKD, stage 3 b

## 2024-09-26 DEATH — deceased

## 2024-09-29 ENCOUNTER — Ambulatory Visit

## 2025-07-26 ENCOUNTER — Ambulatory Visit

## 2025-07-26 ENCOUNTER — Encounter: Admitting: Internal Medicine
